# Patient Record
Sex: Male | Born: 1955
Health system: Southern US, Community
[De-identification: ages and names within clinical notes are randomized; demographics above are authoritative.]

## PROBLEM LIST (undated history)

## (undated) DIAGNOSIS — J302 Other seasonal allergic rhinitis: Secondary | ICD-10-CM

## (undated) DIAGNOSIS — C099 Malignant neoplasm of tonsil, unspecified: Principal | ICD-10-CM

## (undated) DIAGNOSIS — R011 Cardiac murmur, unspecified: Secondary | ICD-10-CM

## (undated) DIAGNOSIS — E785 Hyperlipidemia, unspecified: Secondary | ICD-10-CM

## (undated) DIAGNOSIS — Z923 Personal history of irradiation: Secondary | ICD-10-CM

## (undated) HISTORY — DX: Hyperlipidemia, unspecified: E78.5

## (undated) HISTORY — DX: Cardiac murmur, unspecified: R01.1

## (undated) HISTORY — DX: Other seasonal allergic rhinitis: J30.2

## (undated) HISTORY — DX: Malignant neoplasm of tonsil, unspecified: C09.9

## (undated) HISTORY — PX: WISDOM TOOTH EXTRACTION: SHX21

---

## 2012-03-02 ENCOUNTER — Encounter (HOSPITAL_COMMUNITY): Admission: EM | Disposition: A | Discharge status: Home / Self Care | Payer: Self-pay | Source: Home / Self Care

## 2012-03-02 ENCOUNTER — Encounter (HOSPITAL_COMMUNITY): Payer: Self-pay | Admitting: Anesthesiology

## 2012-03-02 ENCOUNTER — Inpatient Hospital Stay (HOSPITAL_COMMUNITY)
Admission: EM | Admit: 2012-03-02 | Discharge: 2012-03-06 | DRG: 340 | Disposition: A | Discharge status: Home / Self Care | Payer: MEDICAID | Attending: General Surgery | Admitting: General Surgery

## 2012-03-02 ENCOUNTER — Inpatient Hospital Stay (HOSPITAL_COMMUNITY)
Admission: RE | Admit: 2012-03-02 | Discharge: 2012-03-02 | Payer: Self-pay | Source: Ambulatory Visit | Attending: Emergency Medicine | Admitting: Emergency Medicine

## 2012-03-02 ENCOUNTER — Encounter (HOSPITAL_COMMUNITY): Payer: Self-pay

## 2012-03-02 ENCOUNTER — Ambulatory Visit (HOSPITAL_COMMUNITY)
Admission: RE | Admit: 2012-03-02 | Discharge: 2012-03-02 | Disposition: A | Discharge status: Home / Self Care | Payer: Self-pay | Source: Ambulatory Visit | Attending: Emergency Medicine | Admitting: Emergency Medicine

## 2012-03-02 ENCOUNTER — Emergency Department (HOSPITAL_COMMUNITY): Payer: Self-pay | Admitting: Anesthesiology

## 2012-03-02 ENCOUNTER — Ambulatory Visit: Payer: Self-pay | Admitting: Emergency Medicine

## 2012-03-02 ENCOUNTER — Other Ambulatory Visit (HOSPITAL_COMMUNITY): Payer: Self-pay | Admitting: Radiology

## 2012-03-02 ENCOUNTER — Encounter (HOSPITAL_COMMUNITY): Payer: Self-pay | Admitting: *Deleted

## 2012-03-02 VITALS — BP 141/74 | HR 94 | Temp 98.2°F | Resp 16 | Ht 62.5 in | Wt 190.0 lb

## 2012-03-02 DIAGNOSIS — K3533 Acute appendicitis with perforation and localized peritonitis, with abscess: Principal | ICD-10-CM | POA: Diagnosis present

## 2012-03-02 DIAGNOSIS — R109 Unspecified abdominal pain: Secondary | ICD-10-CM

## 2012-03-02 DIAGNOSIS — M51379 Other intervertebral disc degeneration, lumbosacral region without mention of lumbar back pain or lower extremity pain: Secondary | ICD-10-CM | POA: Insufficient documentation

## 2012-03-02 DIAGNOSIS — R1031 Right lower quadrant pain: Secondary | ICD-10-CM | POA: Insufficient documentation

## 2012-03-02 DIAGNOSIS — M48061 Spinal stenosis, lumbar region without neurogenic claudication: Secondary | ICD-10-CM | POA: Insufficient documentation

## 2012-03-02 DIAGNOSIS — M5137 Other intervertebral disc degeneration, lumbosacral region: Secondary | ICD-10-CM | POA: Insufficient documentation

## 2012-03-02 DIAGNOSIS — K5730 Diverticulosis of large intestine without perforation or abscess without bleeding: Secondary | ICD-10-CM | POA: Insufficient documentation

## 2012-03-02 DIAGNOSIS — K8020 Calculus of gallbladder without cholecystitis without obstruction: Secondary | ICD-10-CM | POA: Insufficient documentation

## 2012-03-02 DIAGNOSIS — E876 Hypokalemia: Secondary | ICD-10-CM | POA: Diagnosis not present

## 2012-03-02 DIAGNOSIS — K37 Unspecified appendicitis: Secondary | ICD-10-CM

## 2012-03-02 DIAGNOSIS — Z7982 Long term (current) use of aspirin: Secondary | ICD-10-CM

## 2012-03-02 DIAGNOSIS — K3580 Unspecified acute appendicitis: Secondary | ICD-10-CM | POA: Insufficient documentation

## 2012-03-02 DIAGNOSIS — Z87891 Personal history of nicotine dependence: Secondary | ICD-10-CM

## 2012-03-02 DIAGNOSIS — K352 Acute appendicitis with generalized peritonitis, without abscess: Secondary | ICD-10-CM

## 2012-03-02 DIAGNOSIS — N200 Calculus of kidney: Secondary | ICD-10-CM | POA: Insufficient documentation

## 2012-03-02 HISTORY — PX: LAPAROSCOPIC APPENDECTOMY: SHX408

## 2012-03-02 HISTORY — PX: APPENDECTOMY: SHX54

## 2012-03-02 LAB — POCT CBC
Granulocyte percent: 85.8 %G — AB (ref 37–80)
HCT, POC: 46.6 % (ref 43.5–53.7)
MCHC: 30.9 g/dL — AB (ref 31.8–35.4)
MID (cbc): 0.8 (ref 0–0.9)
MPV: 10.3 fL (ref 0–99.8)
POC Granulocyte: 14.1 — AB (ref 2–6.9)
POC LYMPH PERCENT: 9.1 %L — AB (ref 10–50)
POC MID %: 5.1 %M (ref 0–12)
Platelet Count, POC: 235 10*3/uL (ref 142–424)
RDW, POC: 14.2 %

## 2012-03-02 LAB — BUN: BUN: 15 mg/dL (ref 6–23)

## 2012-03-02 LAB — CREATININE, SERUM: Creatinine, Ser: 0.87 mg/dL (ref 0.50–1.35)

## 2012-03-02 SURGERY — APPENDECTOMY, LAPAROSCOPIC
Anesthesia: General | Site: Abdomen | Wound class: Dirty or Infected

## 2012-03-02 MED ORDER — PROPOFOL 10 MG/ML IV BOLUS
INTRAVENOUS | Status: DC | PRN
  Administered 2012-03-02: 30 mg via INTRAVENOUS
  Administered 2012-03-02: 140 mg via INTRAVENOUS

## 2012-03-02 MED ORDER — ONDANSETRON HCL 4 MG/2ML IJ SOLN
4.0000 mg | Freq: Once | INTRAMUSCULAR | Status: AC
  Administered 2012-03-02: 4 mg via INTRAVENOUS
  Filled 2012-03-02: qty 2

## 2012-03-02 MED ORDER — OXYCODONE HCL 5 MG PO TABS: 5.0000 mg | ORAL_TABLET | Freq: Once | ORAL | Status: AC | PRN

## 2012-03-02 MED ORDER — DEXTROSE 5 % IV SOLN
2.0000 g | INTRAVENOUS | Status: DC | PRN
  Administered 2012-03-02: 2 g via INTRAVENOUS

## 2012-03-02 MED ORDER — FENTANYL CITRATE 0.05 MG/ML IJ SOLN
INTRAMUSCULAR | Status: DC | PRN
  Administered 2012-03-02 (×2): 50 ug via INTRAVENOUS
  Administered 2012-03-02: 150 ug via INTRAVENOUS

## 2012-03-02 MED ORDER — MORPHINE SULFATE 4 MG/ML IJ SOLN
4.0000 mg | Freq: Once | INTRAMUSCULAR | Status: AC
  Administered 2012-03-02: 4 mg via INTRAVENOUS
  Filled 2012-03-02: qty 1

## 2012-03-02 MED ORDER — OXYCODONE HCL 5 MG/5ML PO SOLN: 5.0000 mg | Freq: Once | ORAL | Status: AC | PRN

## 2012-03-02 MED ORDER — ROCURONIUM BROMIDE 100 MG/10ML IV SOLN
INTRAVENOUS | Status: DC | PRN
  Administered 2012-03-02: 10 mg via INTRAVENOUS
  Administered 2012-03-02: 20 mg via INTRAVENOUS

## 2012-03-02 MED ORDER — SUCCINYLCHOLINE CHLORIDE 20 MG/ML IJ SOLN
INTRAMUSCULAR | Status: DC | PRN
  Administered 2012-03-02: 100 mg via INTRAVENOUS

## 2012-03-02 MED ORDER — HYDROMORPHONE HCL PF 1 MG/ML IJ SOLN
INTRAMUSCULAR | Status: AC
  Administered 2012-03-02: 0.5 mg via INTRAVENOUS
  Filled 2012-03-02: qty 1

## 2012-03-02 MED ORDER — LACTATED RINGERS IV SOLN
INTRAVENOUS | Status: DC | PRN
  Administered 2012-03-02 (×2): via INTRAVENOUS

## 2012-03-02 MED ORDER — NEOSTIGMINE METHYLSULFATE 1 MG/ML IJ SOLN
INTRAMUSCULAR | Status: DC | PRN
  Administered 2012-03-02: 4 mg via INTRAVENOUS

## 2012-03-02 MED ORDER — MIDAZOLAM HCL 5 MG/5ML IJ SOLN
INTRAMUSCULAR | Status: DC | PRN
  Administered 2012-03-02: 2 mg via INTRAVENOUS

## 2012-03-02 MED ORDER — SODIUM CHLORIDE 0.9 % IR SOLN
Status: DC | PRN
  Administered 2012-03-02: 1000 mL

## 2012-03-02 MED ORDER — GLYCOPYRROLATE 0.2 MG/ML IJ SOLN
INTRAMUSCULAR | Status: DC | PRN
  Administered 2012-03-02: 0.6 mg via INTRAVENOUS

## 2012-03-02 MED ORDER — BUPIVACAINE-EPINEPHRINE 0.5% -1:200000 IJ SOLN
INTRAMUSCULAR | Status: DC | PRN
  Administered 2012-03-02: 8 mL

## 2012-03-02 MED ORDER — IOHEXOL 300 MG/ML  SOLN
100.0000 mL | Freq: Once | INTRAMUSCULAR | Status: AC | PRN
  Administered 2012-03-02: 100 mL via INTRAVENOUS

## 2012-03-02 MED ORDER — HYDROMORPHONE HCL PF 1 MG/ML IJ SOLN
INTRAMUSCULAR | Status: AC
  Filled 2012-03-02: qty 1

## 2012-03-02 MED ORDER — LIDOCAINE HCL (CARDIAC) 20 MG/ML IV SOLN
INTRAVENOUS | Status: DC | PRN
  Administered 2012-03-02: 50 mg via INTRAVENOUS

## 2012-03-02 MED ORDER — HYDROMORPHONE HCL PF 1 MG/ML IJ SOLN
0.2500 mg | INTRAMUSCULAR | Status: DC | PRN
  Administered 2012-03-02 (×3): 0.5 mg via INTRAVENOUS

## 2012-03-02 MED ORDER — KETOROLAC TROMETHAMINE 30 MG/ML IJ SOLN: 30.0000 mg | Freq: Once | INTRAMUSCULAR | Status: DC

## 2012-03-02 MED ORDER — ONDANSETRON HCL 4 MG/2ML IJ SOLN
INTRAMUSCULAR | Status: DC | PRN
  Administered 2012-03-02: 4 mg via INTRAVENOUS

## 2012-03-02 MED ORDER — DEXTROSE 5 % IV SOLN
2.0000 g | INTRAVENOUS | Status: DC
  Filled 2012-03-02 (×2): qty 2

## 2012-03-02 MED ORDER — DEXAMETHASONE SODIUM PHOSPHATE 10 MG/ML IJ SOLN: 10.0000 mg | Freq: Once | INTRAMUSCULAR | Status: DC

## 2012-03-02 SURGICAL SUPPLY — 42 items
APPLIER CLIP ROT 10 11.4 M/L (STAPLE)
BLADE SURG ROTATE 9660 (MISCELLANEOUS) ×2 IMPLANT
CANISTER SUCTION 2500CC (MISCELLANEOUS) ×2 IMPLANT
CHLORAPREP W/TINT 26ML (MISCELLANEOUS) ×2 IMPLANT
CLIP APPLIE ROT 10 11.4 M/L (STAPLE) IMPLANT
CLOTH BEACON ORANGE TIMEOUT ST (SAFETY) ×2 IMPLANT
COVER SURGICAL LIGHT HANDLE (MISCELLANEOUS) ×2 IMPLANT
CUTTER FLEX LINEAR 45M (STAPLE) ×2 IMPLANT
DERMABOND ADVANCED (GAUZE/BANDAGES/DRESSINGS) ×1
DERMABOND ADVANCED .7 DNX12 (GAUZE/BANDAGES/DRESSINGS) ×1 IMPLANT
DRAIN CHANNEL 19F RND (DRAIN) ×2 IMPLANT
DRAPE UTILITY 15X26 W/TAPE STR (DRAPE) ×4 IMPLANT
DRAPE WARM FLUID 44X44 (DRAPE) IMPLANT
ELECT REM PT RETURN 9FT ADLT (ELECTROSURGICAL) ×2
ELECTRODE REM PT RTRN 9FT ADLT (ELECTROSURGICAL) ×1 IMPLANT
ENDOLOOP SUT PDS II  0 18 (SUTURE)
ENDOLOOP SUT PDS II 0 18 (SUTURE) IMPLANT
EVACUATOR SILICONE 100CC (DRAIN) ×2 IMPLANT
GLOVE BIO SURGEON STRL SZ8 (GLOVE) ×2 IMPLANT
GLOVE BIOGEL PI IND STRL 8 (GLOVE) IMPLANT
GLOVE BIOGEL PI INDICATOR 8 (GLOVE)
GOWN STRL NON-REIN LRG LVL3 (GOWN DISPOSABLE) ×4 IMPLANT
KIT BASIN OR (CUSTOM PROCEDURE TRAY) ×2 IMPLANT
KIT ROOM TURNOVER OR (KITS) ×2 IMPLANT
NS IRRIG 1000ML POUR BTL (IV SOLUTION) ×2 IMPLANT
PAD ARMBOARD 7.5X6 YLW CONV (MISCELLANEOUS) ×2 IMPLANT
POUCH SPECIMEN RETRIEVAL 10MM (ENDOMECHANICALS) ×2 IMPLANT
RELOAD STAPLE TA45 3.5 REG BLU (ENDOMECHANICALS) ×4 IMPLANT
SCALPEL HARMONIC ACE (MISCELLANEOUS) ×2 IMPLANT
SET IRRIG TUBING LAPAROSCOPIC (IRRIGATION / IRRIGATOR) ×2 IMPLANT
SPECIMEN JAR SMALL (MISCELLANEOUS) ×2 IMPLANT
SPONGE GAUZE 4X4 12PLY (GAUZE/BANDAGES/DRESSINGS) ×2 IMPLANT
SUT MON AB 4-0 PC3 18 (SUTURE) ×2 IMPLANT
SUT SILK 3 0 FS 1X18 (SUTURE) ×2 IMPLANT
TAPE CLOTH SURG 4X10 WHT LF (GAUZE/BANDAGES/DRESSINGS) ×2 IMPLANT
TOWEL OR 17X24 6PK STRL BLUE (TOWEL DISPOSABLE) ×2 IMPLANT
TOWEL OR 17X26 10 PK STRL BLUE (TOWEL DISPOSABLE) ×2 IMPLANT
TRAY FOLEY CATH 14FR (SET/KITS/TRAYS/PACK) ×2 IMPLANT
TRAY LAPAROSCOPIC (CUSTOM PROCEDURE TRAY) ×2 IMPLANT
TROCAR XCEL BLADELESS 5X75MML (TROCAR) ×4 IMPLANT
TROCAR XCEL BLUNT TIP 100MML (ENDOMECHANICALS) ×2 IMPLANT
WATER STERILE IRR 1000ML POUR (IV SOLUTION) IMPLANT

## 2012-03-02 NOTE — ED Notes (Signed)
Blank consent sent with pt to OR where the surgeon will explain risks & benefits of procedure, OR informed of need for completed consent

## 2012-03-02 NOTE — Anesthesia Procedure Notes (Signed)
Procedure Name: Intubation Date/Time: 03/02/2012 9:27 PM Performed by: Garen Lah Pre-anesthesia Checklist: Patient identified, Timeout performed, Emergency Drugs available, Suction available and Patient being monitored Patient Re-evaluated:Patient Re-evaluated prior to inductionOxygen Delivery Method: Circle system utilized Preoxygenation: Pre-oxygenation with 100% oxygen Intubation Type: IV induction, Rapid sequence and Cricoid Pressure applied Laryngoscope Size: Mac and 3 Grade View: Grade I Tube type: Oral Tube size: 7.5 mm Number of attempts: 1 Airway Equipment and Method: Stylet Placement Confirmation: ETT inserted through vocal cords under direct vision,  positive ETCO2 and breath sounds checked- equal and bilateral Secured at: 23 cm Tube secured with: Tape Dental Injury: Teeth and Oropharynx as per pre-operative assessment

## 2012-03-02 NOTE — Transfer of Care (Signed)
Immediate Anesthesia Transfer of Care Note  Patient: Douglas Norris  Procedure(s) Performed: Procedure(s) (LRB) with comments: APPENDECTOMY LAPAROSCOPIC (N/A)  Patient Location: PACU  Anesthesia Type:General  Level of Consciousness: awake, alert  and oriented  Airway & Oxygen Therapy: Patient Spontanous Breathing and Patient connected to nasal cannula oxygen  Post-op Assessment: Report given to PACU RN and Post -op Vital signs reviewed and stable  Post vital signs: Reviewed and stable  Complications: No apparent anesthesia complications

## 2012-03-02 NOTE — ED Provider Notes (Signed)
History     CSN: [id number]  Arrival date & time 03/02/12  1659   First MD Initiated Contact with Patient 03/02/12 1724      Chief Complaint  Patient presents with  . Abdominal Pain    (Consider location/radiation/quality/duration/timing/severity/associated sxs/prior treatment) HPI Comments: 56 year old male with no significant past medical history presents emergency department with chief complaint of right lower quadrant abdominal pain.  Onset of symptoms began yesterday at 10 a.m. and are described as a aching throbbing sensation that has been gradually worsening.  Pain does not radiate.  Severity is 5/10 at rest with spikes of 10/10 with movement and palpation.  No known alleviating factors.  The Associated symptoms include nausea, but patient denies vomiting, diarrhea, fever, night sweats, chills, chest pain, urinary symptoms or back pain.  The history is provided by the patient.    No past medical history on file.  No past surgical history on file.  Family History  Problem Relation Age of Onset  . Cancer Mother   . Cancer Father   . Cancer Sister   . Cancer Sister     History  Substance Use Topics  . Smoking status: Former Games developer  . Smokeless tobacco: Not on file  . Alcohol Use: Yes      Review of Systems  Constitutional: Negative for fever, chills and appetite change.  HENT: Negative for congestion, neck stiffness and ear discharge.   Eyes: Negative for visual disturbance.  Respiratory: Negative for shortness of breath.   Cardiovascular: Negative for chest pain and leg swelling.  Gastrointestinal: Positive for nausea and abdominal pain. Negative for vomiting, diarrhea, constipation and blood in stool.  Genitourinary: Negative for dysuria, urgency and frequency.  Skin: Negative for rash.  Neurological: Negative for dizziness, syncope, weakness, light-headedness, numbness and headaches.  Psychiatric/Behavioral: Negative for confusion.  All other systems  reviewed and are negative.    Allergies  Review of patient's allergies indicates no known allergies.  Home Medications   Current Outpatient Rx  Name  Route  Sig  Dispense  Refill  . ACETAMINOPHEN 500 MG PO TABS   Oral   Take 1,000 mg by mouth every 4 (four) hours as needed. For pain         . ASPIRIN 325 MG PO TABS   Oral   Take 325 mg by mouth daily.           BP 144/97  Pulse 94  Temp 98.1 F (36.7 C) (Oral)  Resp 18  SpO2 96%  Physical Exam  Nursing note and vitals reviewed. Constitutional: Vital signs are normal. He appears well-developed and well-nourished. No distress.  HENT:  Head: Normocephalic and atraumatic.  Mouth/Throat: Uvula is midline, oropharynx is clear and moist and mucous membranes are normal.  Eyes: Conjunctivae normal and EOM are normal. Pupils are equal, round, and reactive to light.  Neck: Normal range of motion and full passive range of motion without pain. Neck supple. No spinous process tenderness and no muscular tenderness present. No rigidity. No Brudzinski's sign noted.  Cardiovascular: Normal rate and regular rhythm.   Pulmonary/Chest: Effort normal and breath sounds normal. No accessory muscle usage. Not tachypneic. No respiratory distress.  Abdominal: Soft. Normal appearance. He exhibits no distension, no ascites, no pulsatile midline mass and no mass. There is tenderness (RLQ). There is no CVA tenderness. No hernia.       Normal bowel sounds, abd soft, non rigid, negative Rovsing, + rebound & gaurding  Lymphadenopathy:    He  has no cervical adenopathy.  Neurological: He is alert.  Skin: Skin is warm and dry. No rash noted. He is not diaphoretic.  Psychiatric: He has a normal mood and affect. His speech is normal and behavior is normal.    ED Course  Procedures (including critical care time)  Labs Reviewed - No data to display  CBC    Component Value Date/Time   WBC 16.4* 03/02/2012 1253   RBC 4.94 03/02/2012 1253   HGB 14.4  03/02/2012 1253   HCT 46.6 03/02/2012 1253   MCV 94.4 03/02/2012 1253   MCH 29.1 03/02/2012 1253   MCHC 30.9* 03/02/2012 1253     BMET    Component Value Date/Time   BUN 15 03/02/2012 1417   CREATININE 0.87 03/02/2012 1417   GFRNONAA >90 03/02/2012 1417   GFRAA >90 03/02/2012 1417    Ct Abdomen Pelvis W Contrast  03/02/2012  *RADIOLOGY REPORT*  Clinical Data: Right lower quadrant abdominal pain.  CT ABDOMEN AND PELVIS WITH CONTRAST  Technique:  Multidetector CT imaging of the abdomen and pelvis was performed following the standard protocol during bolus administration of intravenous contrast.  Contrast: OMNIPAQUE IOHEXOL 300 MG/ML  SOLN  Comparison: None.  Findings: The liver, spleen, pancreas, and adrenal glands appear unremarkable.  Several small gallstones are present, each measuring about 4 mm in diameter.  No gallbladder wall thickening or pericholecystic fluid observed.  No extrahepatic biliary dilatation.  No pathologic retroperitoneal or porta hepatis adenopathy is identified.  There is a 2 mm right kidney lower pole nonobstructive calculus and a 3 mm left mid kidney nonobstructive calculus.  There is a 2 mm left kidney lower pole nonobstructive calculus.  No hydronephrosis or hydroureter.  Acute appendicitis is present, with the appendix measuring 16 mm in diameter and with extensive peri-appendiceal stranding.  Adjacent wall thickening of the distal ileum is likely reactive.  The terminal ileum does not appear inflamed.  A small amount of fluid tracks along the right pericolic gutter. No extraluminal gas observed.  Descending and sigmoid diverticulosis noted without active diverticulitis.  Urinary bladder unremarkable. 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  IMPRESSION:  1.  Acute appendicitis with adjacent periappendiceal fluid but without discrete abscess observed. 2.  Cholelithiasis. 3.  Bilateral nonobstructive nephrolithiasis. 4.  Descending  and sigmoid colon diverticulosis.   Original Report Authenticated By: Gaylyn Rong, M.D.      No diagnosis found.  Consult, Dr. Luisa Hart has seen pt in the ER and will take to OR  MDM  Acute appendicitis  56 yo male w no sig PMH presents w RLQ pain onset yesterday. Vitals normal. RLQ abd pain w peritoneal signs on ecam. Labs and imaging reviewed. Leukocytosis. Pt to be admitted to surgery. NPO since in ER.  The patient appears reasonably stabilized for admission considering the current resources, flow, and capabilities available in the ED at this time, and I doubt any other [facility] requiring further screening and/or treatment in the ED prior to admission.        Jaci Carrel, [place] 03/02/12 1922

## 2012-03-02 NOTE — Preoperative (Signed)
Beta Blockers   Reason not to administer Beta Blockers:Not Applicable. No home beta blockers 

## 2012-03-02 NOTE — ED Notes (Signed)
Pt c/O R lower abd pain onset x1 day, pt denies V/D, pt c/o nausea with standing, pt A&O x4, follows commands, speaks in complete sentences

## 2012-03-02 NOTE — Progress Notes (Signed)
[facility] [address] [place] [address] [phone]  Date:  03/02/2012   Name:  Douglas Norris   DOB:  20-Jun-1955   MRN:  [id number]  PCP:  No primary provider on file.    Chief Complaint: Abdominal Pain   History of Present Illness:  Douglas Norris is a 56 y.o. very pleasant male patient who presents with the following:  Onset of RLQ pain that started yesterday.  Did not interfere with appetite.  No nausea or vomiting. No stool change.  Pain constant in RLQ with no migration.  Pain increases with railroad tracks and standing up and walking around.  No fever or chills.  No cough or coryza.  Hungry.  There is no problem list on file for this patient.   History reviewed. No pertinent past medical history.  History reviewed. No pertinent past surgical history.  History  Substance Use Topics  . Smoking status: Former Games developer  . Smokeless tobacco: Not on file  . Alcohol Use: Yes    Family History  Problem Relation Age of Onset  . Cancer Mother   . Cancer Father   . Cancer Sister   . Cancer Sister     No Known Allergies  Medication list has been reviewed and updated.  No current outpatient prescriptions on file prior to visit.    Review of Systems:  As per HPI, otherwise negative.    Physical Examination: Filed Vitals:   03/02/12 1217  BP: 141/74  Pulse: 94  Temp: 98.2 F (36.8 C)  Resp: 16   Filed Vitals:   03/02/12 1217  Height: 5' 2.5" (1.588 m)  Weight: 190 lb (86.183 kg)   Body mass index is 34.20 kg/(m^2). Ideal Body Weight: Weight in (lb) to have BMI = 25: 138.6   GEN: WDWN, NAD, Non-toxic, A & O x 3.  No rash or sepsis or icteris HEENT: Atraumatic, Normocephalic. Neck supple. No masses, No LAD. Ears and Nose: No external deformity. CV: RRR, No M/G/R. No JVD. No thrill. No extra heart sounds. PULM: CTA B, no wheezes, crackles, rhonchi. No retractions. No resp. distress. No accessory muscle use. ABD: S, tender  RLQ, ND, +BS. Direct rebound RLQ. No HSM. EXTR: No c/c/e NEURO Normal gait.  PSYCH: Normally interactive. Conversant. Not depressed or anxious appearing.  Calm demeanor.    Assessment and Plan: RLQ abdominal pain CBC  Carmelina Dane, MD  Results for orders placed in visit on 03/02/12  POCT CBC      Component Value Range   WBC 16.4 (*) 4.6 - 10.2 K/uL   Lymph, poc 1.5  0.6 - 3.4   POC LYMPH PERCENT 9.1 (*) 10 - 50 %L   MID (cbc) 0.8  0 - 0.9   POC MID % 5.1  0 - 12 %M   POC Granulocyte 14.1 (*) 2 - 6.9   Granulocyte percent 85.8 (*) 37 - 80 %G   RBC 4.94  4.69 - 6.13 M/uL   Hemoglobin 14.4  14.1 - 18.1 g/dL   HCT, POC 29.5  62.1 - 53.7 %   MCV 94.4  80 - 97 fL   MCH, POC 29.1  27 - 31.2 pg   MCHC 30.9 (*) 31.8 - 35.4 g/dL   RDW, POC 30.8     Platelet Count, POC 235  142 - 424 K/uL   MPV 10.3  0 - 99.8 fL   TO ER for evaluation and CT scan.  Likely appendicitis

## 2012-03-02 NOTE — Op Note (Signed)
Appendectomy, Lap, Procedure Note  Indications: The patient presented with a history of right-sided abdominal pain. A CT revealed findings consistent with acute appendicitis.The procedure has been discussed with the patient.  Alternative therapies have been discussed with the patient.  Operative risks include bleeding,  Infection,  Organ injury,  Nerve injury,  Blood vessel injury,  DVT,  Pulmonary embolism,  Death,  And possible reoperation.  Medical management risks include worsening of present situation.  The success of the procedure is 50 -90 % at treating patients symptoms.  The patient understands and agrees to proceed.  Pre-operative Diagnosis: Acute appendicitis with peritoneal abscess  Post-operative Diagnosis: Same  Surgeon: Pride Gonzales A.   Assistants: OR staff  Anesthesia: General endotracheal - Double lumen tube and Local anesthesia 0.25.% bupivacaine, with epinephrine  ASA Class: 2  Procedure Details  The patient was seen again in the Holding Room. The risks, benefits, complications, treatment options, and expected outcomes were discussed with the patient and/or family. The possibilities of reaction to medication, pulmonary aspiration, perforation of viscus, bleeding, recurrent infection, finding a normal appendix, the need for additional procedures, failure to diagnose a condition, and creating a complication requiring transfusion or operation were discussed. There was concurrence with the proposed plan and informed consent was obtained. The site of surgery was properly noted/marked. The patient was taken to Operating Room, identified as Douglas Norris and the procedure verified as Appendectomy. A Time Out was held and the above information confirmed.  The patient was placed in the supine position and general anesthesia was induced, along with placement of orogastric tube, Venodyne boots, and a Foley catheter. The abdomen was prepped and draped in a sterile fashion. A one  centimeter infraumbilical incision was made and the peritoneal cavity was accessed using the OPEN  technique. The pneumoperitoneum was then established to steady pressure of 12 mmHg. A 12 mm port was placed through the umbilical incision. Additional 5 mm cannulas then placed in the left lower quadrant of the abdomen and half way between the umbilicus and pubic symphysis under direct vision. A careful evaluation of the entire abdomen was carried out. The patient was placed in Trendelenburg and left lateral decubitus position. The small intestines were retracted in the cephalad and left lateral direction away from the pelvis and right lower quadrant. The patient was found to have an enlarged and inflamed appendix that was extending into the pelvis. There was no evidence of perforation.  The appendix was carefully dissected. A window was made in the mesoappendix at the base of the appendix. A harmonic scalpel was used across the mesoappendix. The appendix was divided at its base using an endo-GIA stapler.  A second load was used .  The stump was inflamed but closed andMinimal appendiceal stump was left in place. There was no evidence of bleeding, leakage, or complication after division of the appendix. Irrigation was also performed and irrigate suctioned from the abdomen as well. Drain placed in the RLQ abscess cavity and brought out through the lower port site ans secured with 2 O nylon.  The umbilical port site was closed using 0 vicryl pursestring sutures fashion at the level of the fascia. The trocar site skin wounds were closed using skin staples.  Instrument, sponge, and needle counts were correct at the conclusion of the case.   Findings: The appendix was found to be inflamed. There were signs of necrosis.  There was perforation. There was abscess formation.  Estimated Blood Loss:  less than 50 mL  Drains: 66 F round         Total IV Fluids: 1500 mL         Specimens: appendix            Complications:  None; patient tolerated the procedure well.         Disposition: PACU - hemodynamically stable.         Condition: stable

## 2012-03-02 NOTE — H&P (Signed)
Douglas Norris is an 56 y.o. male.   Chief Complaint: abdominal pain HPI: 1 day history of RLQ abdominal pain.  Sharp in nature.  Worse with movement.  CT showed acute appendicitis  History reviewed. No pertinent past medical history.  History reviewed. No pertinent past surgical history.  Family History  Problem Relation Age of Onset  . Cancer Mother   . Cancer Father   . Cancer Sister   . Cancer Sister    Social History:  reports that he quit smoking about 8 years ago. He does not have any smokeless tobacco history on file. He reports that he does not drink alcohol or use illicit drugs.  Allergies: No Known Allergies   (Not in a hospital admission)  Results for orders placed during the hospital encounter of 03/02/12 (from the past 48 hour(s))  BUN     Status: Normal   Collection Time   03/02/12  2:17 PM      Component Value Range Comment   BUN 15  6 - 23 mg/dL   CREATININE, SERUM     Status: Normal   Collection Time   03/02/12  2:17 PM      Component Value Range Comment   Creatinine, Ser 0.87  0.50 - 1.35 mg/dL    GFR calc non Af Amer >90  >90 mL/min    GFR calc Af Amer >90  >90 mL/min    Ct Abdomen Pelvis W Contrast  03/02/2012  *RADIOLOGY REPORT*  Clinical Data: Right lower quadrant abdominal pain.  CT ABDOMEN AND PELVIS WITH CONTRAST  Technique:  Multidetector CT imaging of the abdomen and pelvis was performed following the standard protocol during bolus administration of intravenous contrast.  Contrast: OMNIPAQUE IOHEXOL 300 MG/ML  SOLN  Comparison: None.  Findings: The liver, spleen, pancreas, and adrenal glands appear unremarkable.  Several small gallstones are present, each measuring about 4 mm in diameter.  No gallbladder wall thickening or pericholecystic fluid observed.  No extrahepatic biliary dilatation.  No pathologic retroperitoneal or porta hepatis adenopathy is identified.  There is a 2 mm right kidney lower pole nonobstructive calculus and a 3 mm left  mid kidney nonobstructive calculus.  There is a 2 mm left kidney lower pole nonobstructive calculus.  No hydronephrosis or hydroureter.  Acute appendicitis is present, with the appendix measuring 16 mm in diameter and with extensive peri-appendiceal stranding.  Adjacent wall thickening of the distal ileum is likely reactive.  The terminal ileum does not appear inflamed.  A small amount of fluid tracks along the right pericolic gutter. No extraluminal gas observed.  Descending and sigmoid diverticulosis noted without active diverticulitis.  Urinary bladder unremarkable. 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  IMPRESSION:  1.  Acute appendicitis with adjacent periappendiceal fluid but without discrete abscess observed. 2.  Cholelithiasis. 3.  Bilateral nonobstructive nephrolithiasis. 4.  Descending and sigmoid colon diverticulosis.   Original Report Authenticated By: Gaylyn Rong, M.D.     Review of Systems  Constitutional: Negative for fever and chills.  HENT: Negative.   Eyes: Negative.   Respiratory: Negative.   Cardiovascular: Negative.   Gastrointestinal: Positive for abdominal pain.  Genitourinary: Negative.   Musculoskeletal: Negative.   Skin: Negative.   Neurological: Negative.   Endo/Heme/Allergies: Negative.   Psychiatric/Behavioral: Negative.     Blood pressure 129/82, pulse 96, temperature 98.1 F (36.7 C), temperature source Oral, resp. rate 19, SpO2 97.00%. Physical Exam  Constitutional: He is oriented to person, place, and  time. He appears well-developed and well-nourished.  HENT:  Head: Normocephalic and atraumatic.  Eyes: EOM are normal. Pupils are equal, round, and reactive to light.  Neck: Normal range of motion. Neck supple.  Cardiovascular: Normal rate and regular rhythm.   Respiratory: Effort normal and breath sounds normal.  GI: There is tenderness in the right lower quadrant. There is rebound and tenderness at McBurney's  point.  Musculoskeletal: Normal range of motion.  Neurological: He is alert and oriented to person, place, and time.  Skin: Skin is warm and dry.  Psychiatric: He has a normal mood and affect. His behavior is normal. Judgment and thought content normal.     Assessment/Plan Acute appendicitis Recommend laparoscopic appendectomy.  Risks of bleeding infection,  Open procedure abscess,  Organ injury.  Alternatives discussed as well.  Agrees to proceed with laparoscopic appendectomy.  Areona Homer A. 03/02/2012, 8:47 PM

## 2012-03-02 NOTE — Anesthesia Postprocedure Evaluation (Signed)
  Anesthesia Post-op Note  Patient: Douglas Norris  Procedure(s) Performed: Procedure(s) (LRB) with comments: APPENDECTOMY LAPAROSCOPIC (N/A)  Patient Location: PACU  Anesthesia Type:General  Level of Consciousness: awake and alert   Airway and Oxygen Therapy: Patient Spontanous Breathing and Patient connected to nasal cannula oxygen  Post-op Pain: mild  Post-op Assessment: Post-op Vital signs reviewed, Patient's Cardiovascular Status Stable, Respiratory Function Stable, Patent Airway and No signs of Nausea or vomiting  Post-op Vital Signs: Reviewed and stable  Complications: No apparent anesthesia complications

## 2012-03-02 NOTE — Progress Notes (Signed)
56 yo man with abdominal pain, elevated WBC, and CT scan that showed acute appendicitis, seen by Dr. Luisa Hart of General Surgery.

## 2012-03-02 NOTE — Anesthesia Preprocedure Evaluation (Addendum)
Anesthesia Evaluation  Patient identified by MRN, date of birth, ID band Patient awake    Reviewed: Allergy & Precautions, H&P , NPO status , Patient's Chart, lab work & pertinent test results, reviewed documented beta blocker date and time   Airway Mallampati: II TM Distance: >3 FB Neck ROM: Full    Dental No notable dental hx. (+) Teeth Intact and Dental Advisory Given   Pulmonary neg pulmonary ROS,  breath sounds clear to auscultation  Pulmonary exam normal       Cardiovascular negative cardio ROS  Rhythm:Regular Rate:Normal     Neuro/Psych negative neurological ROS  negative psych ROS   GI/Hepatic negative GI ROS, Neg liver ROS,   Endo/Other  negative endocrine ROS  Renal/GU negative Renal ROS  negative genitourinary   Musculoskeletal   Abdominal   Peds  Hematology negative hematology ROS (+)   Anesthesia Other Findings   Reproductive/Obstetrics negative OB ROS                          Anesthesia Physical Anesthesia Plan  ASA: II and emergent  Anesthesia Plan: General   Post-op Pain Management:    Induction: Intravenous, Rapid sequence and Cricoid pressure planned  Airway Management Planned: Oral ETT  Additional Equipment:   Intra-op Plan:   Post-operative Plan: Extubation in OR  Informed Consent: I have reviewed the patients History and Physical, chart, labs and discussed the procedure including the risks, benefits and alternatives for the proposed anesthesia with the patient or authorized representative who has indicated his/her understanding and acceptance.   Dental advisory given  Plan Discussed with: CRNA  Anesthesia Plan Comments:        Anesthesia Quick Evaluation

## 2012-03-03 LAB — CBC
HCT: 41.2 % (ref 39.0–52.0)
Hemoglobin: 13.9 g/dL (ref 13.0–17.0)
MCV: 90.5 fL (ref 78.0–100.0)
WBC: 16 10*3/uL — ABNORMAL HIGH (ref 4.0–10.5)

## 2012-03-03 LAB — COMPREHENSIVE METABOLIC PANEL
Albumin: 2.9 g/dL — ABNORMAL LOW (ref 3.5–5.2)
Alkaline Phosphatase: 45 U/L (ref 39–117)
BUN: 12 mg/dL (ref 6–23)
Calcium: 8.6 mg/dL (ref 8.4–10.5)
GFR calc Af Amer: >90 mL/min (ref >90)
Glucose, Bld: 154 mg/dL — ABNORMAL HIGH (ref 70–99)
Potassium: 3.9 mEq/L (ref 3.5–5.1)
Sodium: 135 mEq/L (ref 135–145)
Total Protein: 6.7 g/dL (ref 6.0–8.3)

## 2012-03-03 MED ORDER — DEXTROSE-NACL 5-0.9 % IV SOLN
INTRAVENOUS | Status: DC
  Administered 2012-03-03: 11:00:00 via INTRAVENOUS
  Administered 2012-03-03: 100 mL/h via INTRAVENOUS
  Administered 2012-03-03 – 2012-03-04 (×2): via INTRAVENOUS

## 2012-03-03 MED ORDER — OXYCODONE-ACETAMINOPHEN 5-325 MG PO TABS
1.0000 | ORAL_TABLET | ORAL | Status: DC | PRN
  Administered 2012-03-06: 2 via ORAL
  Filled 2012-03-03: qty 2

## 2012-03-03 MED ORDER — HYDROMORPHONE HCL PF 1 MG/ML IJ SOLN
1.0000 mg | INTRAMUSCULAR | Status: DC | PRN
  Administered 2012-03-03 (×5): 1 mg via INTRAVENOUS
  Filled 2012-03-03 (×5): qty 1

## 2012-03-03 MED ORDER — ONDANSETRON HCL 4 MG PO TABS: 4.0000 mg | ORAL_TABLET | Freq: Four times a day (QID) | ORAL | Status: DC | PRN

## 2012-03-03 MED ORDER — IBUPROFEN 600 MG PO TABS
600.0000 mg | ORAL_TABLET | Freq: Four times a day (QID) | ORAL | Status: DC | PRN
  Administered 2012-03-04: 600 mg via ORAL
  Filled 2012-03-03: qty 1

## 2012-03-03 MED ORDER — DEXTROSE 5 % IV SOLN
2.0000 g | Freq: Four times a day (QID) | INTRAVENOUS | Status: DC
  Administered 2012-03-03 – 2012-03-05 (×9): 2 g via INTRAVENOUS
  Filled 2012-03-03 (×12): qty 2

## 2012-03-03 MED ORDER — ONDANSETRON HCL 4 MG/2ML IJ SOLN: 4.0000 mg | Freq: Four times a day (QID) | INTRAMUSCULAR | Status: DC | PRN

## 2012-03-03 MED ORDER — ENOXAPARIN SODIUM 40 MG/0.4ML ~~LOC~~ SOLN
40.0000 mg | Freq: Every day | SUBCUTANEOUS | Status: DC
  Administered 2012-03-03 – 2012-03-05 (×3): 40 mg via SUBCUTANEOUS
  Filled 2012-03-03 (×4): qty 0.4

## 2012-03-03 NOTE — Progress Notes (Signed)
1 Day Post-Op  Subjective: Sore. Tired. Not much energy. No n/v. Drinking some liquids  Objective: Vital signs in last 24 hours: Temp:  [98.1 F (36.7 C)-100.7 F (38.2 C)] 98.5 F (36.9 C) (11/30 0526) Pulse Rate:  [93-103] 96  (11/30 0526) Resp:  [13-20] 18  (11/30 0526) BP: (112-145)/(71-97) 112/71 mmHg (11/30 0526) SpO2:  [94 %-98 %] 97 % (11/30 0526) Weight:  [190 lb (86.183 kg)] 190 lb (86.183 kg) (11/30 0015) Last BM Date: 03/01/12  Intake/Output from previous day: 11/29 0701 - 11/30 0700 In: 2208.3 [P.O.:60; I.V.:2108.3] Out: 1050 [Urine:1010; Drains:40] Intake/Output this shift:    Alert, appropriate, nad Soft, full, approp TTP. Incisions c/d/i. Drain - serosang SCDs  Lab Results:   Basename 03/03/12 0645 03/02/12 1253  WBC 16.0* 16.4*  HGB 13.9 14.4  HCT 41.2 46.6  PLT 205 --   BMET  Basename 03/03/12 0645 03/02/12 1417  NA 135 --  K 3.9 --  CL 101 --  CO2 26 --  GLUCOSE 154* --  BUN 12 15  CREATININE 0.84 0.87  CALCIUM 8.6 --   PT/INR No results found for this basename: LABPROT:2,INR:2 in the last 72 hours ABG No results found for this basename: PHART:2,PCO2:2,PO2:2,HCO3:2 in the last 72 hours  Studies/Results: Ct Abdomen Pelvis W Contrast  03/02/2012  *RADIOLOGY REPORT*  Clinical Data: Right lower quadrant abdominal pain.  CT ABDOMEN AND PELVIS WITH CONTRAST  Technique:  Multidetector CT imaging of the abdomen and pelvis was performed following the standard protocol during bolus administration of intravenous contrast.  Contrast: OMNIPAQUE IOHEXOL 300 MG/ML  SOLN  Comparison: None.  Findings: The liver, spleen, pancreas, and adrenal glands appear unremarkable.  Several small gallstones are present, each measuring about 4 mm in diameter.  No gallbladder wall thickening or pericholecystic fluid observed.  No extrahepatic biliary dilatation.  No pathologic retroperitoneal or porta hepatis adenopathy is identified.  There is a 2 mm right kidney  lower pole nonobstructive calculus and a 3 mm left mid kidney nonobstructive calculus.  There is a 2 mm left kidney lower pole nonobstructive calculus.  No hydronephrosis or hydroureter.  Acute appendicitis is present, with the appendix measuring 16 mm in diameter and with extensive peri-appendiceal stranding.  Adjacent wall thickening of the distal ileum is likely reactive.  The terminal ileum does not appear inflamed.  A small amount of fluid tracks along the right pericolic gutter. No extraluminal gas observed.  Descending and sigmoid diverticulosis noted without active diverticulitis.  Urinary bladder unremarkable. 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  IMPRESSION:  1.  Acute appendicitis with adjacent periappendiceal fluid but without discrete abscess observed. 2.  Cholelithiasis. 3.  Bilateral nonobstructive nephrolithiasis. 4.  Descending and sigmoid colon diverticulosis.   Original Report Authenticated By: Gaylyn Rong, M.D.     Anti-infectives: Anti-infectives     Start     Dose/Rate Route Frequency Ordered Stop   03/03/12 0600   cefOXitin (MEFOXIN) 2 g in dextrose 5 % 50 mL IVPB  Status:  Discontinued        2 g 100 mL/hr over 30 Minutes Intravenous On call to O.R. 03/02/12 2139 03/03/12 0009   03/03/12 0400   cefOXitin (MEFOXIN) 2 g in dextrose 5 % 50 mL IVPB        2 g 100 mL/hr over 30 Minutes Intravenous Every 6 hours 03/03/12 0045            Assessment/Plan: s/p Procedure(s) (LRB) with comments:  APPENDECTOMY LAPAROSCOPIC (N/A) for ruptured appendicitis Clears, adv diet as tolerated IS, pulm toilet, OOB Cont IV abx for several days given perforation VTE prophy - lovenox  Mary Sella. Andrey Campanile, MD, FACS General, Bariatric, & Minimally Invasive Surgery [facility], [place]   LOS: 1 day    Atilano Ina 03/03/2012

## 2012-03-03 NOTE — ED Provider Notes (Signed)
Medical screening examination/treatment/procedure(s) were conducted as a shared visit with non-physician practitioner(s) and myself.  I personally evaluated the patient during the encounter 56 yo man with abdominal pain, elevated WBC, and CT scan that showed acute appendicitis, seen by Dr. Luisa Hart of General Surgery.       Carleene Cooper III, MD 03/03/12 1001

## 2012-03-04 LAB — CBC
HCT: 37.3 % — ABNORMAL LOW (ref 39.0–52.0)
Hemoglobin: 12.7 g/dL — ABNORMAL LOW (ref 13.0–17.0)
RDW: 13.1 % (ref 11.5–15.5)
WBC: 11.5 10*3/uL — ABNORMAL HIGH (ref 4.0–10.5)

## 2012-03-04 LAB — BASIC METABOLIC PANEL
BUN: 9 mg/dL (ref 6–23)
Chloride: 105 mEq/L (ref 96–112)
GFR calc Af Amer: >90 mL/min (ref >90)
Potassium: 3.3 mEq/L — ABNORMAL LOW (ref 3.5–5.1)
Sodium: 140 mEq/L (ref 135–145)

## 2012-03-04 MED ORDER — POTASSIUM CHLORIDE CRYS ER 20 MEQ PO TBCR
40.0000 meq | EXTENDED_RELEASE_TABLET | Freq: Once | ORAL | Status: AC
  Administered 2012-03-04: 40 meq via ORAL
  Filled 2012-03-04: qty 2

## 2012-03-04 NOTE — Progress Notes (Signed)
2 Days Post-Op  Subjective: Doing well. No n/v. No flatus. "weak". Tolerating clears  Objective: Vital signs in last 24 hours: Temp:  [97.9 F (36.6 C)-99.2 F (37.3 C)] 98.5 F (36.9 C) (12/01 0212) Pulse Rate:  [73-96] 95  (12/01 0212) Resp:  [18-20] 18  (12/01 0212) BP: (111-144)/(69-83) 114/76 mmHg (12/01 0212) SpO2:  [96 %-98 %] 96 % (12/01 0212) Last BM Date: 02/29/12  Intake/Output from previous day: 11/30 0701 - 12/01 0700 In: 2560 [P.O.:960; I.V.:1600] Out: -  Intake/Output this shift:    Sitting in chair, nad cta b/l Reg Soft, full, hypobs. Incisions c/d/i No edema  Lab Results:   Basename 03/04/12 0630 03/03/12 0645  WBC 11.5* 16.0*  HGB 12.7* 13.9  HCT 37.3* 41.2  PLT 211 205   BMET  Basename 03/04/12 0630 03/03/12 0645  NA 140 135  K 3.3* 3.9  CL 105 101  CO2 28 26  GLUCOSE 130* 154*  BUN 9 12  CREATININE 0.75 0.84  CALCIUM 8.2* 8.6   PT/INR No results found for this basename: LABPROT:2,INR:2 in the last 72 hours ABG No results found for this basename: PHART:2,PCO2:2,PO2:2,HCO3:2 in the last 72 hours  Studies/Results: Ct Abdomen Pelvis W Contrast  03/02/2012  *RADIOLOGY REPORT*  Clinical Data: Right lower quadrant abdominal pain.  CT ABDOMEN AND PELVIS WITH CONTRAST  Technique:  Multidetector CT imaging of the abdomen and pelvis was performed following the standard protocol during bolus administration of intravenous contrast.  Contrast: OMNIPAQUE IOHEXOL 300 MG/ML  SOLN  Comparison: None.  Findings: The liver, spleen, pancreas, and adrenal glands appear unremarkable.  Several small gallstones are present, each measuring about 4 mm in diameter.  No gallbladder wall thickening or pericholecystic fluid observed.  No extrahepatic biliary dilatation.  No pathologic retroperitoneal or porta hepatis adenopathy is identified.  There is a 2 mm right kidney lower pole nonobstructive calculus and a 3 mm left mid kidney nonobstructive calculus.  There  is a 2 mm left kidney lower pole nonobstructive calculus.  No hydronephrosis or hydroureter.  Acute appendicitis is present, with the appendix measuring 16 mm in diameter and with extensive peri-appendiceal stranding.  Adjacent wall thickening of the distal ileum is likely reactive.  The terminal ileum does not appear inflamed.  A small amount of fluid tracks along the right pericolic gutter. No extraluminal gas observed.  Descending and sigmoid diverticulosis noted without active diverticulitis.  Urinary bladder unremarkable. 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  IMPRESSION:  1.  Acute appendicitis with adjacent periappendiceal fluid but without discrete abscess observed. 2.  Cholelithiasis. 3.  Bilateral nonobstructive nephrolithiasis. 4.  Descending and sigmoid colon diverticulosis.   Original Report Authenticated By: Gaylyn Rong, M.D.     Anti-infectives: Anti-infectives     Start     Dose/Rate Route Frequency Ordered Stop   03/03/12 0600   cefOXitin (MEFOXIN) 2 g in dextrose 5 % 50 mL IVPB  Status:  Discontinued        2 g 100 mL/hr over 30 Minutes Intravenous On call to O.R. 03/02/12 2139 03/03/12 0009   03/03/12 0400   cefOXitin (MEFOXIN) 2 g in dextrose 5 % 50 mL IVPB        2 g 100 mL/hr over 30 Minutes Intravenous Every 6 hours 03/03/12 0045            Assessment/Plan: s/p Procedure(s) (LRB) with comments: APPENDECTOMY LAPAROSCOPIC (N/A) Cont IV abx secondary to perforation Adv diet to fulls  Decrease mIVF Hypokalemia - replace Potassium with Kdur Needs 2 more days of iv abx then finish po course for a total of 10 days Hopefully home Tues  Lynde Ludwig M. Andrey Campanile, MD, FACS General, Bariatric, & Minimally Invasive Surgery [facility], [place]   LOS: 2 days    Douglas Norris 03/04/2012

## 2012-03-04 NOTE — Progress Notes (Addendum)
Pt was ambulating in hall when Jp drain started leaking around insertion site. Rn replaced old dressing and milked tubing. Will continue to monitor/

## 2012-03-05 ENCOUNTER — Encounter (HOSPITAL_COMMUNITY): Payer: Self-pay | Admitting: Surgery

## 2012-03-05 MED ORDER — AMOXICILLIN-POT CLAVULANATE 875-125 MG PO TABS
1.0000 | ORAL_TABLET | Freq: Two times a day (BID) | ORAL | Status: DC
  Administered 2012-03-05 – 2012-03-06 (×3): 1 via ORAL
  Filled 2012-03-05 (×4): qty 1

## 2012-03-05 NOTE — Progress Notes (Signed)
Patient ID: Douglas Norris, male   DOB: [birth year], 56 y.o.   MRN: [id number] 3 Days Post-Op  Subjective: Pt feels ok.  Hasn't taken pain meds in 24hrs. Tolerating full liquids.  Ambulating well.  Objective: Vital signs in last 24 hours: Temp:  [98.4 F (36.9 C)-98.9 F (37.2 C)] 98.7 F (37.1 C) (12/02 0630) Pulse Rate:  [80-93] 84  (12/02 0630) Resp:  [18-20] 18  (12/02 0630) BP: (121-134)/(74-87) 134/87 mmHg (12/02 0630) SpO2:  [96 %-99 %] 98 % (12/02 0630) Last BM Date: 02/29/12  Intake/Output from previous day: 12/01 0701 - 12/02 0700 In: 1949.8 [P.O.:480; I.V.:1469.8] Out: 100 [Urine:60; Drains:40] Intake/Output this shift:    PE: Abd: soft on left, but still feels tight on right.  Some distention, but great BS, drain with serosang output.  Incisions c/d/i with dermabond  Lab Results:   Basename 03/04/12 0630 03/03/12 0645  WBC 11.5* 16.0*  HGB 12.7* 13.9  HCT 37.3* 41.2  PLT 211 205   BMET  Basename 03/04/12 0630 03/03/12 0645  NA 140 135  K 3.3* 3.9  CL 105 101  CO2 28 26  GLUCOSE 130* 154*  BUN 9 12  CREATININE 0.75 0.84  CALCIUM 8.2* 8.6   PT/INR No results found for this basename: LABPROT:2,INR:2 in the last 72 hours CMP     Component Value Date/Time   NA 140 03/04/2012 0630   K 3.3* 03/04/2012 0630   CL 105 03/04/2012 0630   CO2 28 03/04/2012 0630   GLUCOSE 130* 03/04/2012 0630   BUN 9 03/04/2012 0630   CREATININE 0.75 03/04/2012 0630   CALCIUM 8.2* 03/04/2012 0630   PROT 6.7 03/03/2012 0645   ALBUMIN 2.9* 03/03/2012 0645   AST 17 03/03/2012 0645   ALT 13 03/03/2012 0645   ALKPHOS 45 03/03/2012 0645   BILITOT 0.6 03/03/2012 0645   GFRNONAA >90 03/04/2012 0630   GFRAA >90 03/04/2012 0630   Lipase  No results found for this basename: lipase       Studies/Results: No results found.  Anti-infectives: Anti-infectives     Start     Dose/Rate Route Frequency Ordered Stop   03/05/12 1000   amoxicillin-clavulanate (AUGMENTIN) 875-125 MG per  tablet 1 tablet        1 tablet Oral Every 12 hours 03/05/12 0858     03/03/12 0600   cefOXitin (MEFOXIN) 2 g in dextrose 5 % 50 mL IVPB  Status:  Discontinued        2 g 100 mL/hr over 30 Minutes Intravenous On call to O.R. 03/02/12 2139 03/03/12 0009   03/03/12 0400   cefOXitin (MEFOXIN) 2 g in dextrose 5 % 50 mL IVPB  Status:  Discontinued        2 g 100 mL/hr over 30 Minutes Intravenous Every 6 hours 03/03/12 0045 03/05/12 0858           Assessment/Plan  1. S/p lap appy for perf appendicitis  Plan: 1. Advance to regular diet today, hopefully can go home tomorrow 2. Will dc drain if remains clear prior to dc home. 3. Watch belly as he still seems fairly bloated to me.   LOS: 3 days    Tylek Boney E 03/05/2012, 8:58 AM Pager: [phone]

## 2012-03-05 NOTE — Progress Notes (Signed)
Ileus resolving slowly. Home when tolerating reg diet.

## 2012-03-06 MED ORDER — OXYCODONE-ACETAMINOPHEN 5-325 MG PO TABS: 1.0000 | ORAL_TABLET | ORAL | Status: DC | PRN

## 2012-03-06 MED ORDER — AMOXICILLIN-POT CLAVULANATE 875-125 MG PO TABS: 1.0000 | ORAL_TABLET | Freq: Two times a day (BID) | ORAL | Status: DC

## 2012-03-06 NOTE — Discharge Summary (Signed)
Patient ID: Douglas Norris MRN: [id number] DOB/AGE: 12/26/1955 56 y.o.  Admit date: 03/02/2012 Discharge date: 03/06/2012  Procedures: laparoscopic appendectomy with drain placement  Consults: None  Reason for Admission: 1 day history of RLQ abdominal pain. Sharp in nature. Worse with movement. CT showed acute appendicitis  Admission Diagnoses:  1. Acute appendicitis  Hospital Course: The patient was admitted and taken to the operating room where he was found to have a ruptured appendix.  This was removed laparoscopically and a drain was placed.  Postoperatively , the patient did well.  His diet was able to be advanced as tolerated.  His drain remained serosang in output.  This was able to be removed on POD# 3 prior to dc home.  He otherwise was afebrile and doing well.  PE: Abd: much softer today, +BS, ND, incisions c/d/i, jp with serosang output.  This will be removed.  Discharge Diagnoses:  1, acute perforated appendicitis 2. S/p lap appy  Discharge Medications:   Medication List     As of 03/06/2012  9:32 AM    STOP taking these medications         acetaminophen 500 MG tablet   Commonly known as: TYLENOL      TAKE these medications         amoxicillin-clavulanate 875-125 MG per tablet   Commonly known as: AUGMENTIN   Take 1 tablet by mouth every 12 (twelve) hours.      aspirin 325 MG tablet   Take 325 mg by mouth daily.      oxyCODONE-acetaminophen 5-325 MG per tablet   Commonly known as: PERCOCET/ROXICET   Take 1-2 tablets by mouth every 4 (four) hours as needed.        Discharge Instructions:     Follow-up Information    Follow up with [facility]. On 03/20/2012. (3:30pm, arrive at 3:00pm)    Contact information:   [address]   [place] [address] [phone]          Signed: Letha Cape 03/06/2012, 9:32 AM

## 2012-03-06 NOTE — Discharge Summary (Signed)
Agree with above 

## 2012-03-20 ENCOUNTER — Ambulatory Visit (INDEPENDENT_AMBULATORY_CARE_PROVIDER_SITE_OTHER): Payer: Self-pay | Admitting: General Surgery

## 2012-03-20 ENCOUNTER — Encounter (INDEPENDENT_AMBULATORY_CARE_PROVIDER_SITE_OTHER): Payer: Self-pay

## 2012-03-20 VITALS — BP 142/92 | HR 76 | Temp 98.1°F | Ht 69.0 in | Wt 192.8 lb

## 2012-03-20 DIAGNOSIS — K3580 Unspecified acute appendicitis: Secondary | ICD-10-CM

## 2012-03-20 NOTE — Patient Instructions (Signed)
Laparoscopic Appendectomy Appendectomy is surgery to remove the appendix. Laparoscopic surgery uses several small cuts (incisions) instead of one large incision. Laparoscopic surgery offers a shorter recovery time and less discomfort. LET YOUR CAREGIVER KNOW ABOUT:  Allergies to food or medicine.  Medicines taken, including vitamins, dietary supplements, herbs, eyedrops, over-the-counter medicines, and creams.  Use of steroids (by mouth or creams).  Previous problems with anesthetics or numbing medicines.  History of bleeding problems or blood clots.  Previous surgery.  Other health problems, including diabetes, heart problems, lung problems, and kidney problems.  Possibility of pregnancy, if this applies. RISKS AND COMPLICATIONS  Infection. A germ starts growing in the wound. This can usually be treated with antibiotics. In some cases, the wound will need to be opened and cleaned.  Bleeding.  Damage to other organs.  Sores (abscesses).  Chronic pain at the incision sites. This is defined as pain that lasts for more than 3 months.  Blood clots in the legs that may rarely travel to the lungs.  Infection in the lungs (pneumonia). BEFORE THE PROCEDURE Appendectomy is usually performed immediately after an inflamed appendix (appendicitis) is diagnosed. No preparation is necessary ahead of this procedure. PROCEDURE  You will be given medicine that makes you sleep (general anesthetic). After you are asleep, a flexible tube (catheter) may be inserted into your bladder to drain your urine during surgery. The tube is removed before you wake up after surgery. When you are asleep, carbondioxide gas will be used to inflate your abdomen. This will allow your surgeon to see inside your abdomen and perform your surgery. Three small incisions will be made in your abdomen. Your surgeon will insert a thin, lighted tube (laparoscope) through one of the incisions. Your surgeon will look through the  laparoscope while performing the surgery. Other tools will be inserted through the other incisions. Laparoscopic procedures may not be appropriate when:  There is major scarring from a previous surgery.  The patient has bleeding disorders.  A pregnancy is near term.  There are other conditions which make the laparoscopic procedure impossible, such as an advanced infection or a ruptured appendix. If your surgeon feels it is not safe to continue with the laparoscopic procedure, he or she will perform an open surgery instead. This gives the surgeon a larger view and more space to work. Open surgery requires a longer recovery time. After your appendix is removed, your incisions will be closed with stitches (sutures) or skin adhesive. AFTER THE PROCEDURE You will be taken to a recovery room. When the anesthesia has worn off, you will be returned to your hospital room. You will be given pain medicines to keep you comfortable. Ask your caregiver how long your hospital stay will be. Document Released: 11/03/2003 Document Revised: 06/13/2011 Document Reviewed: 09/28/2010 [facility] Patient Information 2013 [place], [place].  Call if you have anymore problems.

## 2012-03-20 NOTE — Progress Notes (Signed)
Douglas Norris 1955/12/01 [id number] 03/20/2012   History of Present Illness: Douglas Norris is a  56 y.o. male who presents today status post lap appy by Dr. Harriette Bouillon.  Pathology reveals Acute full thickness appendicitis with periappendiceal abscess formation and serositis, NO Tumor seen. The patient is tolerating a regular diet, having normal bowel movements, has good pain control.  He  is back to most normal activities.   Physical Exam: Abd: soft, nontender, active bowel sounds, nondistended.  All incisions are well healed. BP 142/92  Pulse 76  Temp 98.1 F (36.7 C) (Temporal)  Ht 5\' 9"  (1.753 m)  Wt 192 lb 12.8 oz (87.454 kg)  BMI 28.47 kg/m2  SpO2 97% BP is up and we talked about that.  He needs primary care to follow up with that.  Prior to Admission medications   Not on File    Impression: 1.  Acute appendicitis, s/p lap appy  Plan: He  is able to return to normal activities. He  may follow up on a prn basis.

## 2015-11-13 ENCOUNTER — Ambulatory Visit: Payer: Self-pay

## 2015-12-28 ENCOUNTER — Ambulatory Visit (INDEPENDENT_AMBULATORY_CARE_PROVIDER_SITE_OTHER): Payer: BLUE CROSS/BLUE SHIELD | Admitting: Family Medicine

## 2015-12-28 VITALS — BP 122/88 | HR 89 | Temp 98.3°F | Resp 17 | Ht 69.0 in | Wt 191.0 lb

## 2015-12-28 DIAGNOSIS — M25512 Pain in left shoulder: Secondary | ICD-10-CM | POA: Diagnosis not present

## 2015-12-28 DIAGNOSIS — M25511 Pain in right shoulder: Secondary | ICD-10-CM

## 2015-12-28 DIAGNOSIS — M542 Cervicalgia: Secondary | ICD-10-CM

## 2015-12-28 MED ORDER — MELOXICAM 15 MG PO TABS: 15.0000 mg | ORAL_TABLET | Freq: Every day | ORAL | 1 refills | Status: DC

## 2015-12-28 MED ORDER — PREDNISONE 20 MG PO TABS: ORAL_TABLET | ORAL | 0 refills | Status: DC

## 2015-12-28 NOTE — Patient Instructions (Addendum)
Take Prednisone 20 mg,  in mornings with breakfast as follows:  Take 3 pills for 3 days, Take 2 pills for 3 days, and Take 1 pill for 3 days.  Complete all medication.  After prednisone taper is complete, you may take Meloxicam 15 mg once daily as needed for pain.  I have submitted referral for orthopedics.  They will follow-up with you to schedule an appointment.    IF you received an x-ray today, you will receive an invoice from [facility]. Please contact [facility] at [phone] with questions or concerns regarding your invoice.   IF you received labwork today, you will receive an invoice from [employer]. Please contact [facility] at [phone] with questions or concerns regarding your invoice.   Our billing staff will not be able to assist you with questions regarding bills from these companies.  You will be contacted with the lab results as soon as they are available. The fastest way to get your results is to activate your My Chart account. Instructions are located on the last page of this paperwork. If you have not heard from [place] regarding the results in 2 weeks, please contact this office.
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
## 2016-06-17 ENCOUNTER — Ambulatory Visit (INDEPENDENT_AMBULATORY_CARE_PROVIDER_SITE_OTHER): Payer: BLUE CROSS/BLUE SHIELD | Admitting: Physician Assistant

## 2016-06-17 ENCOUNTER — Ambulatory Visit (INDEPENDENT_AMBULATORY_CARE_PROVIDER_SITE_OTHER): Payer: BLUE CROSS/BLUE SHIELD

## 2016-06-17 VITALS — BP 126/82 | HR 83 | Temp 98.6°F | Resp 18 | Ht 69.0 in | Wt 198.0 lb

## 2016-06-17 DIAGNOSIS — R221 Localized swelling, mass and lump, neck: Secondary | ICD-10-CM | POA: Diagnosis not present

## 2016-06-17 LAB — POCT CBC
Granulocyte percent: 72 %G (ref 37–80)
HEMATOCRIT: 43.1 % — AB (ref 43.5–53.7)
HEMOGLOBIN: 15.4 g/dL (ref 14.1–18.1)
LYMPH, POC: 1.6 (ref 0.6–3.4)
MCH: 31.4 pg — AB (ref 27–31.2)
MCHC: 35.8 g/dL — AB (ref 31.8–35.4)
MCV: 87.6 fL (ref 80–97)
MID (CBC): 0.8 (ref 0–0.9)
MPV: 8.5 fL (ref 0–99.8)
POC GRANULOCYTE: 6.3 (ref 2–6.9)
POC LYMPH PERCENT: 18.5 %L (ref 10–50)
POC MID %: 9.5 % (ref 0–12)
Platelet Count, POC: 248 10*3/uL (ref 142–424)
RBC: 4.92 M/uL (ref 4.69–6.13)
RDW, POC: 12.7 %
WBC: 8.8 10*3/uL (ref 4.6–10.2)

## 2016-06-17 LAB — POCT SEDIMENTATION RATE: POCT SED RATE: 26 mm/hr — AB (ref 0–22)

## 2016-06-17 NOTE — Progress Notes (Signed)
06/18/2016 7:49 PM   DOB: 30-Nov-1955 / MRN: [id number]  SUBJECTIVE:  Douglas Norris is a 61 y.o. male presenting for a mass on the left side of the neck that started about 6 months ago.  He is a former smoker.  Denies weight loss, night sweat, fatigue.  He typically exercises daily and notes no change in his ability to exercise. He has a 40 pack year history of smoking. He does report some cat scratches. He denies difficulty swallowing but does complain of some mild sore throat that started 4-5 days ago. He also complains of some mild and transient left sided ear pain.   He has No Known Allergies.   He  has a past medical history of Heart murmur.    He  reports that he quit smoking about 12 years ago. He has never used smokeless tobacco. He reports that he does not drink alcohol or use drugs. He  reports that he currently engages in sexual activity. The patient  has a past surgical history that includes laparoscopic appendectomy (03/02/2012) and Appendectomy (03/02/12).  His family history includes Cancer in his father, mother, sister, and sister.  Review of Systems  Constitutional: Negative for fever.  Skin: Negative for itching and rash.  Neurological: Negative for dizziness.    The problem list and medications were reviewed and updated by myself where necessary and exist elsewhere in the encounter.   OBJECTIVE:  BP 126/82   Pulse 83   Temp 98.6 F (37 C) (Oral)   Resp 18   Ht 5\' 9"  (1.753 m)   Wt 198 lb (89.8 kg)   SpO2 97%   BMI 29.24 kg/m   Wt Readings from Last 3 Encounters:  06/17/16 198 lb (89.8 kg)  12/28/15 191 lb (86.6 kg)  03/20/12 192 lb 12.8 oz (87.5 kg)    Lab Results  Component Value Date   WBC 8.8 06/17/2016   HGB 15.4 06/17/2016   HCT 43.1 (A) 06/17/2016   MCV 87.6 06/17/2016   PLT 211 03/04/2012     Physical Exam  Constitutional: He appears well-developed and well-nourished. No distress.  HENT:  Right Ear: Tympanic membrane normal.  Left Ear:  Tympanic membrane normal.  Mouth/Throat: Uvula is midline, oropharynx is clear and moist and mucous membranes are normal.  Dentition in poor repair.  Gums negative for TTP.    Neck:    Cardiovascular: Normal rate, regular rhythm, normal heart sounds and intact distal pulses.   Pulmonary/Chest: Effort normal and breath sounds normal.  Musculoskeletal: Normal range of motion.  Neurological: He is alert.  Skin: He is not diaphoretic.      Results for orders placed or performed in visit on 06/17/16 (from the past 72 hour(s))  POCT CBC     Status: Abnormal   Collection Time: 06/17/16  3:01 PM  Result Value Ref Range   WBC 8.8 4.6 - 10.2 K/uL   Lymph, poc 1.6 0.6 - 3.4   POC LYMPH PERCENT 18.5 10 - 50 %L   MID (cbc) 0.8 0 - 0.9   POC MID % 9.5 0 - 12 %M   POC Granulocyte 6.3 2 - 6.9   Granulocyte percent 72.0 37 - 80 %G   RBC 4.92 4.69 - 6.13 M/uL   Hemoglobin 15.4 14.1 - 18.1 g/dL   HCT, POC 43.1 (A) 43.5 - 53.7 %   MCV 87.6 80 - 97 fL   MCH, POC 31.4 (A) 27 - 31.2 pg   MCHC 35.8 (A)  31.8 - 35.4 g/dL   RDW, POC 12.7 %   Platelet Count, POC 248 142 - 424 K/uL   MPV 8.5 0 - 99.8 fL  Basic metabolic panel     Status: Abnormal   Collection Time: 06/17/16  3:07 PM  Result Value Ref Range   Glucose 86 65 - 99 mg/dL   BUN 19 6 - 24 mg/dL   Creatinine, Ser 0.88 0.76 - 1.27 mg/dL   GFR calc non Af Amer 95 >59 mL/min/1.73   GFR calc Af Amer 109 >59 mL/min/1.73   BUN/Creatinine Ratio 22 (H) 9 - 20   Sodium 140 134 - 144 mmol/L   Potassium 4.1 3.5 - 5.2 mmol/L   Chloride 98 96 - 106 mmol/L   CO2 24 18 - 29 mmol/L   Calcium 9.3 8.7 - 10.2 mg/dL  POCT SEDIMENTATION RATE     Status: Abnormal   Collection Time: 06/17/16  4:00 PM  Result Value Ref Range   POCT SED RATE 26 (A) 0 - 22 mm/hr    No results found.  Lab Results  Component Value Date   CREATININE 0.88 06/17/2016   BUN 19 06/17/2016   NA 140 06/17/2016   K 4.1 06/17/2016   CL 98 06/17/2016   CO2 24 06/17/2016      ASSESSMENT AND PLAN:  Douglas Norris was seen today for mass.  Diagnoses and all orders for this visit:  Neck mass: I am very concerned about his presentation today mostly with regard to malignancy.  CBC WNL and reassuring.   Will complete medical work up with blood work and scan, and will get him to ENT who will most likely biopsy.   -     POCT CBC -     POCT SEDIMENTATION RATE -     DG Neck Soft Tissue; Future -     CT Soft Tissue Neck W Contrast; Future -     Ambulatory referral to ENT -     Basic metabolic panel -     Bartonella Antibody Panel -     Epstein-Barr virus VCA antibody panel    The patient is advised to call or return to clinic if he does not see an improvement in symptoms, or to seek the care of the closest emergency department if he worsens with the above plan.   Philis Fendt, MHS, PA-C [facility] 06/18/2016 7:49 PM

## 2016-06-17 NOTE — Patient Instructions (Signed)
     IF you received an x-ray today, you will receive an invoice from [facility]. Please contact [facility] at [phone] with questions or concerns regarding your invoice.   IF you received labwork today, you will receive an invoice from [facility]. Please contact [facility] at [phone] with questions or concerns regarding your invoice.   Our billing staff will not be able to assist you with questions regarding bills from these companies.  You will be contacted with the lab results as soon as they are available. The fastest way to get your results is to activate your My Chart account. Instructions are located on the last page of this paperwork. If you have not heard from us regarding the results in 2 weeks, please contact this office.     

## 2016-06-18 LAB — BASIC METABOLIC PANEL
BUN / CREAT RATIO: 22 — AB (ref 9–20)
BUN: 19 mg/dL (ref 6–24)
CALCIUM: 9.3 mg/dL (ref 8.7–10.2)
CHLORIDE: 98 mmol/L (ref 96–106)
CO2: 24 mmol/L (ref 18–29)
CREATININE: 0.88 mg/dL (ref 0.76–1.27)
GFR calc Af Amer: 109 mL/min/{1.73_m2} (ref >59)
GFR calc non Af Amer: 95 mL/min/{1.73_m2} (ref >59)
Glucose: 86 mg/dL (ref 65–99)
Potassium: 4.1 mmol/L (ref 3.5–5.2)
Sodium: 140 mmol/L (ref 134–144)

## 2016-06-20 LAB — EPSTEIN-BARR VIRUS VCA ANTIBODY PANEL
EBV EARLY ANTIGEN AB, IGG: 49.8 U/mL — AB (ref 0.0–8.9)
EBV NA IGG: 267 U/mL — AB (ref 0.0–17.9)
EBV VCA IgG: 555 U/mL — ABNORMAL HIGH (ref 0.0–17.9)

## 2016-06-20 LAB — BARTONELLA ANTIBODY PANEL
B Quintana IgM: NEGATIVE titer
B henselae IgG: NEGATIVE titer
B henselae IgM: NEGATIVE titer
B quintana IgG: NEGATIVE titer

## 2016-06-27 ENCOUNTER — Ambulatory Visit
Admission: RE | Admit: 2016-06-27 | Discharge: 2016-06-27 | Disposition: A | Discharge status: Home / Self Care | Payer: BLUE CROSS/BLUE SHIELD | Source: Ambulatory Visit | Attending: Physician Assistant | Admitting: Physician Assistant

## 2016-06-27 DIAGNOSIS — R221 Localized swelling, mass and lump, neck: Secondary | ICD-10-CM

## 2016-06-27 MED ORDER — IOPAMIDOL (ISOVUE-300) INJECTION 61%
75.0000 mL | Freq: Once | INTRAVENOUS | Status: AC | PRN
  Administered 2016-06-27: 75 mL via INTRAVENOUS

## 2016-06-28 ENCOUNTER — Other Ambulatory Visit: Payer: Self-pay | Admitting: Otolaryngology

## 2016-06-29 ENCOUNTER — Other Ambulatory Visit (HOSPITAL_COMMUNITY): Payer: Self-pay | Admitting: Otolaryngology

## 2016-06-29 DIAGNOSIS — C099 Malignant neoplasm of tonsil, unspecified: Secondary | ICD-10-CM

## 2016-07-01 ENCOUNTER — Encounter: Payer: Self-pay | Admitting: Radiation Oncology

## 2016-07-01 ENCOUNTER — Telehealth: Payer: Self-pay | Admitting: *Deleted

## 2016-07-01 NOTE — Telephone Encounter (Signed)
Oncology Nurse Navigator Documentation  Placed introductory call to new referral patient.  Introduced myself as the H&N oncology nurse navigator that works with Dr. Isidore Moos to whom he has been referred by Dr. Lucia Gaskins.  He confirmed his understanding of referral and appt date/time of 07/13/16 3:00/3:30.  I briefly explained my role as his navigator, indicated that I would be joining him during his appt next week.  I confirmed his understanding of [place] location, explained arrival and RadOnc registration process for appt.  I answered his questions re treatment for tonsil cancer.  I provided my contact information, encouraged him to call with questions/concerns before his appt. He verbalized understanding of information provided, expressed appreciation for my call.   Gayleen Orem, RN, BSN, [place] [facility] Oncology Nurse [place] at [place] [phone]

## 2016-07-01 NOTE — Progress Notes (Addendum)
Head and Neck Cancer Location of Tumor / Histology:  06/28/16 Tonsil, biopsy, left - INVASIVE SQUAMOUS CELL CARCINOMA The malignant cells are strongly and diffusely positive for p16.  Patient presented  with symptoms of: Left mandibular mass for 4 months.   Biopsies of Left Tonsil revealed: Invasive squamous cell carcinoma.   Nutrition Status Yes No Comments  Weight changes? []  []    Swallowing concerns? []  []    PEG? []  []     Referrals Yes No Comments  Social Work? []  []    Dentistry? [x]  []  07/08/16 Dr. Enrique Sack. He recommends dental extractions, however pt declined pending evaluation by Dr. Isidore Moos and Dr. Alvy Bimler  Swallowing therapy? []  []    Nutrition? []  []    Med/Onc? [x]  []  Dr. Alvy Bimler 07/13/16   Safety Issues Yes No Comments  Prior radiation? []  []    Pacemaker/ICD? []  []    Possible current pregnancy? []  []    Is the patient on methotrexate? []  []     Tobacco/Marijuana/Snuff/ETOH use: He is a former smoker. No alcohol use  Past/Anticipated interventions by otolaryngology, if any: Dr. Lucia Gaskins  Past/Anticipated interventions by medical oncology, if any:  07/13/16 Dr. Alvy Bimler   Current Complaints / other details:   PET 07/05/16 IMPRESSION: 1. Hypermetabolism in the region of the left palatini tonsil, consistent with known primary malignancy. 2. Hypermetabolic left-sided level II and III metastatic cervical lymphadenopathy. 3. 6 mm posterior left lower lobe pulmonary nodule. Attention on follow-up recommended as metastatic disease not excluded. 4. Cholelithiasis. 5. Nephrolithiasis. 6. Coronary artery and thoracoabdominal aortic atherosclerosis 06/27/16 CT neck No chief complaint on file.

## 2016-07-04 ENCOUNTER — Encounter: Payer: Self-pay | Admitting: *Deleted

## 2016-07-04 NOTE — Progress Notes (Signed)
Oncology Nurse Navigator Documentation  Met patient and his wife [facility] hospital entrance.  They had arrived to learn logistics to Radiology in preparation for his PET tomorrow morning.  I showed them Radiology Dept, explained registration process, explained scan procedure, reinforced importance of NPO status after midnight. He voiced understanding.  I showed them location of Dental Medicine in anticipation of pre-radiotherapy appt with Dr. Helyn Numbers.  He stated his teeth are in poor condition.  I explained the purpose of a dental evaluation prior to starting RT, potential for extractions prior to RT, indicated he wd be contacted to arrange an appt within a day or so of his appt with Dr. Isidore Moos.   We discussed importance of nutrition during RT/chemo, the likely recommendation for a temporary feeding tube. They understand I can be contacted prior to upcoming appts.  Gayleen Orem, RN, BSN, [place] [facility] Oncology Nurse [place] at [place] [phone]

## 2016-07-05 ENCOUNTER — Encounter (HOSPITAL_COMMUNITY)
Admission: RE | Admit: 2016-07-05 | Discharge: 2016-07-05 | Disposition: A | Discharge status: Home / Self Care | Payer: BLUE CROSS/BLUE SHIELD | Source: Ambulatory Visit | Attending: Otolaryngology | Admitting: Otolaryngology

## 2016-07-05 DIAGNOSIS — C099 Malignant neoplasm of tonsil, unspecified: Secondary | ICD-10-CM | POA: Diagnosis not present

## 2016-07-05 LAB — GLUCOSE, CAPILLARY: Glucose-Capillary: 99 mg/dL (ref 65–99)

## 2016-07-05 MED ORDER — FLUDEOXYGLUCOSE F - 18 (FDG) INJECTION
9.5500 | Freq: Once | INTRAVENOUS | Status: AC | PRN
  Administered 2016-07-05: 9.55 via INTRAVENOUS

## 2016-07-08 ENCOUNTER — Encounter (HOSPITAL_COMMUNITY): Payer: Self-pay | Admitting: Dentistry

## 2016-07-08 ENCOUNTER — Ambulatory Visit (HOSPITAL_COMMUNITY): Payer: Self-pay | Admitting: Dentistry

## 2016-07-08 VITALS — BP 147/92 | HR 79 | Temp 98.0°F

## 2016-07-08 DIAGNOSIS — M264 Malocclusion, unspecified: Secondary | ICD-10-CM

## 2016-07-08 DIAGNOSIS — C099 Malignant neoplasm of tonsil, unspecified: Secondary | ICD-10-CM

## 2016-07-08 DIAGNOSIS — K08409 Partial loss of teeth, unspecified cause, unspecified class: Secondary | ICD-10-CM

## 2016-07-08 DIAGNOSIS — Z01818 Encounter for other preprocedural examination: Secondary | ICD-10-CM

## 2016-07-08 DIAGNOSIS — K0889 Other specified disorders of teeth and supporting structures: Secondary | ICD-10-CM

## 2016-07-08 DIAGNOSIS — C090 Malignant neoplasm of tonsillar fossa: Secondary | ICD-10-CM | POA: Insufficient documentation

## 2016-07-08 DIAGNOSIS — K06010 Localized gingival recession, unspecified: Secondary | ICD-10-CM

## 2016-07-08 DIAGNOSIS — K0530 Chronic periodontitis, unspecified: Secondary | ICD-10-CM

## 2016-07-08 DIAGNOSIS — K036 Deposits [accretions] on teeth: Secondary | ICD-10-CM

## 2016-07-08 DIAGNOSIS — K029 Dental caries, unspecified: Secondary | ICD-10-CM

## 2016-07-08 DIAGNOSIS — K045 Chronic apical periodontitis: Secondary | ICD-10-CM

## 2016-07-08 DIAGNOSIS — K011 Impacted teeth: Secondary | ICD-10-CM

## 2016-07-08 HISTORY — DX: Malignant neoplasm of tonsil, unspecified: C09.9

## 2016-07-08 NOTE — Progress Notes (Signed)
DENTAL CONSULTATION  Date of Consultation:  07/08/2016 Patient Name:   Douglas Norris Date of Birth:   15-Sep-1955 Medical Record Number: [id number]  VITALS: BP (!) 147/92 (BP Location: Left Arm)   Pulse 79   Temp 98 F (36.7 C) (Oral)   CHIEF COMPLAINT: Patient referred by Dr. Lucia Gaskins for a dental consultation.  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 Patient with anticipated chemoradiation therapy. Patient is now seen as part of a medically necessary pre-chemoradiation therapy dental protocol examination.  The patient currently denies having any toothaches, swellings, or abscesses. Patient was last seen by a dentist in 2013 for the fabrication of a maxillary acrylic partial denture. This was with Dr. Burnard Bunting.  Patient was not satisfied with the acrylic partial denture and does not wear the acrylic partial denture. Patient does not seek regular dental care and only sees the dentist " when I need to."  Patient had wisdom tooth extraction of the upper right quadrant with an oral surgeon, Dr. Romie Minus.  Patient denies having any complications from that dental extraction. Patient is very adamant about not having additional dental extractions at this time. Patient denies having dental phobia.   PROBLEM LIST: Patient Active Problem List   Diagnosis Date Noted  . Squamous cell carcinoma of left tonsil ([place]) 07/08/2016    PMH: Past Medical History:  Diagnosis Date  . Heart murmur    as a child    PSH: Past Surgical History:  Procedure Laterality Date  . APPENDECTOMY  03/02/12  . LAPAROSCOPIC APPENDECTOMY  03/02/2012   Procedure: APPENDECTOMY LAPAROSCOPIC;  Surgeon: Joyice Faster. Cornett, MD;  Location: [facility] OR;  Service: General;  Laterality: N/A;    ALLERGIES: No Known Allergies  MEDICATIONS: Current Outpatient Prescriptions  Medication Sig Dispense Refill  . meloxicam (MOBIC) 15 MG tablet Take 1 tablet (15 mg total)  by mouth daily. (Patient not taking: Reported on 06/17/2016) 30 tablet 1  . predniSONE (DELTASONE) 20 MG tablet Take 3 PO QAM x3days, 2 PO QAM x3days, 1 PO QAM x3days (Patient not taking: Reported on 06/17/2016) 18 tablet 0   No current facility-administered medications for this visit.     LABS: Lab Results  Component Value Date   WBC 8.8 06/17/2016   HGB 15.4 06/17/2016   HCT 43.1 (A) 06/17/2016   MCV 87.6 06/17/2016   PLT 211 03/04/2012      Component Value Date/Time   NA 140 06/17/2016 1507   K 4.1 06/17/2016 1507   CL 98 06/17/2016 1507   CO2 24 06/17/2016 1507   GLUCOSE 86 06/17/2016 1507   GLUCOSE 130 (H) 03/04/2012 0630   BUN 19 06/17/2016 1507   CREATININE 0.88 06/17/2016 1507   CALCIUM 9.3 06/17/2016 1507   GFRNONAA 95 06/17/2016 1507   GFRAA 109 06/17/2016 1507   No results found for: INR, PROTIME No results found for: PTT  SOCIAL HISTORY: Social History   Social History  . Marital status: Married    Spouse name: N/A  . Number of children: 0  . Years of education: N/A   Occupational History  . Bus Driver    Social History Main Topics  . Smoking status: Former Smoker    Quit date: 07/04/2003  . Smokeless tobacco: Never Used  . Alcohol use No     Comment: Quit 2005  . Drug use: No  . Sexual activity: Yes     Comment: i partner in last  12 months   Other Topics Concern  . Not on file   Social History Narrative  . No narrative on file    FAMILY HISTORY: Family History  Problem Relation Age of Onset  . Cancer Mother     breast  . Cancer Father     pancreatic  . Cancer Sister     breast  . Cancer Sister     breast    REVIEW OF SYSTEMS: Reviewed With the patient as per history of present illness.  Psych: The patient denies having dental phobia.  DENTAL HISTORY: CHIEF COMPLAINT: Patient referred by Dr. Lucia Gaskins for a dental consultation.  HPI: Douglas Norris is a 61 year old male recently diagnosed with squamous cell carcinoma of the left  tonsil. Patient with anticipated chemoradiation therapy. Patient is now seen as part of a medically necessary pre-chemoradiation therapy dental protocol examination.  The patient currently denies having any toothaches, swellings, or abscesses. Patient was last seen by a dentist in 2013 for the fabrication of a maxillary acrylic partial denture. This was with Dr. Burnard Bunting.  Patient was not satisfied with the acrylic partial denture and does not wear the acrylic partial denture. Patient does not seek regular dental care and only sees the dentist " when I need to."  Patient had wisdom tooth extraction of the upper right quadrant with an oral surgeon, Dr. Romie Minus.  Patient denies having any complications from that dental extraction. Patient is very adamant about not having additional dental extractions at this time. Patient denies having dental phobia.   DENTAL EXAMINATION: GENERAL: The patient is a well-developed, well-nourished male in no acute distress.  HEAD AND NECK: There is left neck lymphadenopathy. I do not palpate any right neck lymphadenopathy. The patient denies acute TMJ symptoms. Maximum interincisal opening is measured at 43 mm.  INTRAORAL EXAM:Patient has normal saliva. I do not see any evidence of oral abscess formation. DENTITION:  The patient is missing tooth numbers 1, 2, 7, 9, 14, 15 .  Tooth numbers 16, 17, and 32 are full bony impactions.  PERIODONTAL: Patient has chronic periodontitis with plaque and calculus accumulations, gingival recession, and tooth mobility. There is incipient to moderate bone loss noted.  DENTAL CARIES/SUBOPTIMAL RESTORATIONS: There are multiple dental caries noted as per dental charting form.  ENDODONTIC: The patient currently denies acute pulpitis symptoms. Patient has periapical pathology associated with tooth #31. CROWN AND BRIDGE: No crown or bridge restorations.  PROSTHODONTIC: Patient has a maxillary acrylic partial denture replacing tooth numbers 7 and 9.   The patient does not wear the maxillary acrylic partial denture at this time. This was fabricated in 2013 by patient report. The maxillary partial denture is less than ideal but clinically acceptable.  OCCLUSION:Patient has a poor occlusal scheme secondary to multiple missing teeth and lack of replacement of all missing teeth with dental restorations.   RADIOGRAPHIC INTERPRETATION: An orthopantogram was taken and supplemented with 13 periapical radiographs and 4 bitewings. There are multiple missing teeth. There are full bony impacted tooth numbers 16, 17, and 32. There is incipient to moderate bone loss. There is periapical pathology and radiolucency associated with the apex of tooth #31. Multiple dental caries are noted. Radiographic calculus is noted. There is supra-eruption and drifting of the unopposed teeth into the edentulous areas.   ASSESSMENTS: 1. Squamous cell carcinoma of the left tonsil 2. Pre-chemoradiation therapy dental protocol 3. 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 Dental caries 5. Chronic periodontitis with bone loss 6. Gingival recession 7. Accretions  8. Tooth mobility as per dental charting form 9. Multiple missing teeth 10. Full bony impacted tooth numbers 16, 17, and 32. 11. Supra-eruption and drifting of the unopposed teeth into the edentulous areas 12. Multiple rotated teeth  13. Maxillary and mandibular interincisal attrition 14. Poor occlusal scheme and malocclusion 15. Patient is very adamant about NOT wanting dental extractions prior to radiation therapy.   PLAN/RECOMMENDATIONS: 1. I discussed the risks, benefits, and complications of various treatment options with the patient in relationship to his medical and dental conditions, anticipated radiation therapy and possible chemotherapy, and chemoradiation therapy side effects to include xerostomia, radiation caries, trismus, mucositis, taste changes, gum and jawbone changes, and risk for infection and  osteoradionecrosis.  We discussed various treatment options to include no treatment, multiple extractions with alveoloplasty,  evaluation by an oral surgeon for second opinion on extraction of tooth numbers 45, 62, 8, 29, and 32, pre-prosthetic surgery as indicated, periodontal therapy, dental restorations, root canal therapy, crown and bridge therapy, implant therapy, and replacement of missing teeth as indicated. We also discussed fabrication of fluoride trays and scatter protection devices as needed.  The patient currently wishes to proceed with the consultations with Dr. Isidore Moos and Dr. Alvy Bimler concerning the radiation therapy and chemotherapy before further discussion of dental treatment.  Patient is very adamant about not wanting dental extractions in spite of the future risk for infection and osteoradionecrosis. I again suggested oral surgical evaluation with his previous oral surgeon, Dr. Romie Minus, concerning dental extractions. He will consider this option. Patient is also considering future impressions for the fabrication of fluoride trays versus the use of PreviDent 5000 prescription strength fluoride toothpaste. Additional dental treatment discussion is pending his initial consultation with Dr. Alvy Bimler and Dr. Isidore Moos on Wednesday, 07/13/2016.  Patient is to call Dental Medicine to further discuss treatment options after his consultations on Wednesday.   2. Discussion of findings with medical team and coordination of future medical and dental care as needed.  I spent in excess of  120 minutes during the conduct of this consultation and >50% of this time involved direct face-to-face encounter for counseling and/or coordination of the patient's care.    Lenn Cal, DDS

## 2016-07-08 NOTE — Patient Instructions (Signed)

## 2016-07-13 ENCOUNTER — Encounter: Payer: Self-pay | Admitting: *Deleted

## 2016-07-13 ENCOUNTER — Ambulatory Visit
Admission: RE | Admit: 2016-07-13 | Discharge: 2016-07-13 | Disposition: A | Discharge status: Home / Self Care | Payer: BLUE CROSS/BLUE SHIELD | Source: Ambulatory Visit | Attending: Radiation Oncology | Admitting: Radiation Oncology

## 2016-07-13 ENCOUNTER — Ambulatory Visit (HOSPITAL_BASED_OUTPATIENT_CLINIC_OR_DEPARTMENT_OTHER): Payer: BLUE CROSS/BLUE SHIELD | Admitting: Hematology and Oncology

## 2016-07-13 ENCOUNTER — Encounter: Payer: Self-pay | Admitting: Radiation Oncology

## 2016-07-13 ENCOUNTER — Encounter: Payer: Self-pay | Admitting: Hematology and Oncology

## 2016-07-13 VITALS — BP 142/95 | HR 85 | Temp 98.4°F | Ht 69.0 in | Wt 197.2 lb

## 2016-07-13 VITALS — BP 130/81 | HR 91 | Temp 98.2°F | Resp 18 | Ht 69.0 in | Wt 198.0 lb

## 2016-07-13 DIAGNOSIS — C090 Malignant neoplasm of tonsillar fossa: Secondary | ICD-10-CM | POA: Insufficient documentation

## 2016-07-13 DIAGNOSIS — Z510 Encounter for antineoplastic radiation therapy: Secondary | ICD-10-CM | POA: Diagnosis present

## 2016-07-13 DIAGNOSIS — C099 Malignant neoplasm of tonsil, unspecified: Secondary | ICD-10-CM

## 2016-07-13 DIAGNOSIS — C770 Secondary and unspecified malignant neoplasm of lymph nodes of head, face and neck: Secondary | ICD-10-CM | POA: Insufficient documentation

## 2016-07-13 DIAGNOSIS — Z87891 Personal history of nicotine dependence: Secondary | ICD-10-CM | POA: Insufficient documentation

## 2016-07-13 DIAGNOSIS — Z809 Family history of malignant neoplasm, unspecified: Secondary | ICD-10-CM | POA: Insufficient documentation

## 2016-07-13 DIAGNOSIS — R509 Fever, unspecified: Secondary | ICD-10-CM | POA: Diagnosis not present

## 2016-07-13 DIAGNOSIS — Z79899 Other long term (current) drug therapy: Secondary | ICD-10-CM | POA: Diagnosis not present

## 2016-07-13 DIAGNOSIS — R634 Abnormal weight loss: Secondary | ICD-10-CM

## 2016-07-13 NOTE — Progress Notes (Signed)
[facility] CONSULT NOTE  Patient Care Team: No Pcp Per Patient as PCP - General (General Practice) Rozetta Nunnery, MD as Consulting Physician (Otolaryngology) Eppie Gibson, MD as Attending Physician (Radiation Oncology) Heath Lark, MD as Consulting Physician (Hematology and Oncology) Lenn Cal, DDS as Consulting Physician (Dentistry) Leota Sauers, RN as Oncology Nurse [place], RD as Dietitian (Nutrition) Sharen Counter, CCC-SLP as Speech Language Pathologist (Speech Pathology) Jomarie Longs, PT as Physical Therapist (Physical Therapy) Kennith Center, LCSW as Social Worker  CHIEF COMPLAINTS/PURPOSE OF CONSULTATION:  Newly diagnosed squamous cell carcinoma of the tonsil The patient is here accompanied by his wife, Cecille Rubin of 9 years.  The patient is currently not working.  He used to drive a school bus  HISTORY OF PRESENTING ILLNESS:  Douglas Norris 61 y.o. male is here because of newly diagnosed tonsil cancer.  He first presented with palpable left neck mass for the past 4-5 weeks. It is not causing any pain.  He presented to primary care doctor for evaluation and was subsequently referred to ENT.  I review his records and summarized as follows:   Squamous cell carcinoma of left tonsil ([place])   06/27/2016 Imaging    Ct neck: Multiple malignant appearing lymph nodes in the left cervical chain as described, up to 23 mm in diameter. Possible early nodal disease in the right level 2 neck. Asymmetric fullness of the left tonsillar fossa, suspect squamous cell carcinoma primary.       06/28/2016 Pathology Results    Diagnosis Tonsil, biopsy, left - INVASIVE SQUAMOUS CELL CARCINOMA. - SEE COMMENT. Microscopic Comment The carcinoma appears moderately differentiated and is non-keratinizing. A p16 stain will be performed and the results reported separately The malignant cells are strongly and diffusely positive for p16.      07/05/2016 PET  scan    1. Hypermetabolism in the region of the left palatini tonsil, consistent with known primary malignancy. 2. Hypermetabolic left-sided level II and III metastatic cervical lymphadenopathy. 3. 6 mm posterior left lower lobe pulmonary nodule. Attention on follow-up recommended as metastatic disease not excluded. 4. Cholelithiasis. 5. Nephrolithiasis. 6. Coronary artery and thoracoabdominal aortic atherosclerosis.       he denies any hearing deficit, difficulties with chewing food, swallowing difficulties, painful swallowing, changes in the quality of his voice or abnormal weight loss.  MEDICAL HISTORY:  Past Medical History:  Diagnosis Date  . Heart murmur    as a child  . Squamous cell carcinoma of left tonsil ([facility]) 07/08/2016    SURGICAL HISTORY: Past Surgical History:  Procedure Laterality Date  . APPENDECTOMY  03/02/12  . LAPAROSCOPIC APPENDECTOMY  03/02/2012   Procedure: APPENDECTOMY LAPAROSCOPIC;  Surgeon: Joyice Faster. Cornett, MD;  Location: [facility];  Service: General;  Laterality: N/A;    SOCIAL HISTORY: Social History   Social History  . Marital status: Married    Spouse name: N/A  . Number of children: 0  . Years of education: N/A   Occupational History  . unemployed    Social History Main Topics  . Smoking status: Former Smoker    Quit date: 07/04/2003  . Smokeless tobacco: Never Used  . Alcohol use No     Comment: Quit 2005  . Drug use: No  . Sexual activity: Yes     Comment: i partner in last 12 months   Other Topics Concern  . Not on file   Social History Narrative  . No narrative on  file    FAMILY HISTORY: Family History  Problem Relation Age of Onset  . Cancer Mother     breast  . Cancer Father     pancreatic  . Cancer Sister     breast  . Cancer Sister     breast    ALLERGIES:  has No Known Allergies.  MEDICATIONS:  Current Outpatient Prescriptions  Medication Sig Dispense Refill  . Melatonin 10 MG TABS Take by mouth.    .  Multiple Vitamin (MULTIVITAMIN) tablet Take 1 tablet by mouth daily.    . Omega-3 Fatty Acids (FISH OIL OMEGA-3 PO) Take by mouth.    Marland Kitchen VALERIAN ROOT PO Take by mouth.     No current facility-administered medications for this visit.     REVIEW OF SYSTEMS:   Constitutional: Denies fevers, chills or abnormal night sweats Eyes: Denies blurriness of vision, double vision or watery eyes Ears, nose, mouth, throat, and face: Denies mucositis or sore throat Respiratory: Denies cough, dyspnea or wheezes Cardiovascular: Denies palpitation, chest discomfort or lower extremity swelling Gastrointestinal:  Denies nausea, heartburn or change in bowel habits Skin: Denies abnormal skin rashes Neurological:Denies numbness, tingling or new weaknesses Behavioral/Psych: Mood is stable, no new changes  All other systems were reviewed with the patient and are negative.  PHYSICAL EXAMINATION: ECOG PERFORMANCE STATUS: 1 - Symptomatic but completely ambulatory  Vitals:   07/13/16 1339  BP: 130/81  Pulse: 91  Resp: 18  Temp: 98.2 F (36.8 C)   Filed Weights   07/13/16 1339  Weight: 198 lb (89.8 kg)    GENERAL:alert, no distress and comfortable SKIN: skin color, texture, turgor are normal, no rashes or significant lesions EYES: normal, conjunctiva are pink and non-injected, sclera clear OROPHARYNX:no exudate, no erythema and lips, buccal mucosa, and tongue normal  NECK: supple, thyroid normal size, non-tender, without nodularity LYMPH: He has palpable large neck mass on exam LUNGS: clear to auscultation and percussion with normal breathing effort HEART: regular rate & rhythm and no murmurs and no lower extremity edema ABDOMEN:abdomen soft, non-tender and normal bowel sounds Musculoskeletal:no cyanosis of digits and no clubbing  PSYCH: alert & oriented x 3 with fluent speech NEURO: no focal motor/sensory deficits  LABORATORY DATA:  I have reviewed the data as listed Lab Results  Component Value  Date   WBC 8.8 06/17/2016   HGB 15.4 06/17/2016   HCT 43.1 (A) 06/17/2016   MCV 87.6 06/17/2016   PLT 211 03/04/2012   Lab Results  Component Value Date   NA 140 06/17/2016   K 4.1 06/17/2016   CL 98 06/17/2016   CO2 24 06/17/2016    RADIOGRAPHIC STUDIES: I reviewed imaging study with the patient and his wife I have personally reviewed the radiological images as listed and agreed with the findings in the report. Dg Neck Soft Tissue  Result Date: 06/17/2016 CLINICAL DATA:  The patient reports a golf ball sized lymph node on the left side of the neck for 6 months. EXAM: NECK SOFT TISSUES - 1+ VIEW COMPARISON:  None. FINDINGS: There is no evidence of retropharyngeal soft tissue swelling or epiglottic enlargement. The cervical airway is unremarkable and no radio-opaque foreign body identified. IMPRESSION: Negative exam. Plain films are not sensitive for evaluation of lymphadenopathy or soft tissue mass. CT neck with contrast is a much better test. Electronically Signed   By: Inge Rise M.D.   On: 06/17/2016 14:51   Ct Soft Tissue Neck W Contrast  Result Date: 06/27/2016 CLINICAL DATA:  Left submandibular mass.  Symptoms for 4 months. EXAM: CT NECK WITH CONTRAST TECHNIQUE: Multidetector CT imaging of the neck was performed using the standard protocol following the bolus administration of intravenous contrast. CONTRAST:  85mL ISOVUE-300 IOPAMIDOL (ISOVUE-300) INJECTION 61% COMPARISON:  None. FINDINGS: Pharynx and larynx: Suspicious asymmetric fullness and subtly increased enhancement of the left palatine tonsil. No evidence of muscular or carotid space invasion. Salivary glands: No inflammation, mass, or stone. Thyroid: Normal. Lymph nodes: There are at least 4 enlarged lymph nodes in the left cervical chain, level 2, 3, and 3/4. Largest is at the level of the hyoid and a palpable marker and measures 23 mm. This lymph node has some neighboring fat haziness but no certain extracapsular tumor.  Left cervical adenopathy at least continues to the level of the upper cricoid, where there is a lymph node anterior to the IJ measuring 9 mm in diameter. No enlarged right cervical chain lymph nodes, but there is a right level 2 lobulated node with possible low-density component posteriorly. Vascular: The left IJ is compressed at the level of greatest cervical adenopathy. Carotid atherosclerosis. Limited intracranial: Negative Visualized orbits: Negative Mastoids and visualized paranasal sinuses: Clear Skeleton: No acute or aggressive finding Upper chest: No acute finding or visible nodules. Other: These results will be called to the ordering clinician or representative by the Radiologist Assistant, and communication documented in the PACS or zVision Dashboard. IMPRESSION: Multiple malignant appearing lymph nodes in the left cervical chain as described, up to 23 mm in diameter. Possible early nodal disease in the right level 2 neck. Asymmetric fullness of the left tonsillar fossa, suspect squamous cell carcinoma primary. Electronically Signed   By: Monte Fantasia M.D.   On: 06/27/2016 16:38   Nm Pet Image Initial (pi) Skull Base To Thigh  Result Date: 07/05/2016 CLINICAL DATA:  Initial treatment strategy for tonsillar cancer. EXAM: NUCLEAR MEDICINE PET SKULL BASE TO THIGH TECHNIQUE: 9.6 mCi F-18 FDG was injected intravenously. Full-ring PET imaging was performed from the skull base to thigh after the radiotracer. CT data was obtained and used for attenuation correction and anatomic localization. FASTING BLOOD GLUCOSE:  Value: 99 mg/dl COMPARISON:  CT neck 06/27/2016 FINDINGS: NECK Hypermetabolism in the region left palatini tonsil compatible with known primary malignancy. SUV max = 15.5. Left-sided level II/III hypermetabolic lymphadenopathy demonstrates SUV max = 20.1. Low level III lymph node measuring 8 mm short axis seen image 45 series 4 is hypermetabolic with SUV max = 6.7. CHEST No hypermetabolic  mediastinal or hilar nodes. No suspicious pulmonary nodules on the CT scan. Heart size upper normal. Insert calcium heart ascending thoracic aorta measures 3.8 cm maximum diameter. 6 mm posterior left lower lobe pulmonary nodule (image 57 series 7) shows no hypermetabolic activity on PET imaging, but is below the accepted size threshold for reliable resolution. ABDOMEN/PELVIS No abnormal hypermetabolic activity within the liver, pancreas, adrenal glands, or spleen. No hypermetabolic lymph nodes in the abdomen or pelvis. Calcified gallstones evident. Nonobstructing renal calculi noted bilaterally. Left-sided diverticulosis without diverticulitis. There is abdominal aortic atherosclerosis without aneurysm. SKELETON No focal hypermetabolic activity to suggest skeletal metastasis. IMPRESSION: 1. Hypermetabolism in the region of the left palatini tonsil, consistent with known primary malignancy. 2. Hypermetabolic left-sided level II and III metastatic cervical lymphadenopathy. 3. 6 mm posterior left lower lobe pulmonary nodule. Attention on follow-up recommended as metastatic disease not excluded. 4. Cholelithiasis. 5. Nephrolithiasis. 6. Coronary artery and thoracoabdominal aortic atherosclerosis. Electronically Signed   By: Verda Cumins.D.  On: 07/05/2016 08:41    ASSESSMENT:  Newly diagnosed squamous cell carcinoma of the Head & Neck, HPV Positive  PLAN:   Squamous cell carcinoma of left tonsil ([facility]) I discussed with the patient and family members to rationale for concurrent chemoradiation therapy. The intention of treatment is curative. I explained to him why he is not a surgical candidate In preparation for treatment, he would need to obtain dental clearance, multidisciplinary clinic to see dietitian, speech and language therapist and physical therapy along with chemotherapy education class I discussed with the patient the rationale of placing port and feeding tube up front before treatment. We  will also obtain baseline hearing test. I recommend concurrent chemoradiation therapy with high-dose cisplatin on days 1, 22 and 43. I will get the ENT navigator to coordinate appointments and I will see him back prior to start date of treatment    Orders Placed This Encounter  Procedures  . Ambulatory Referral to Speech Therapy  (specifically to Garald Balding)    Referral Priority:   Routine    Referral Type:   Speech Therapy    Referral Reason:   Specialty Services Required    Requested Specialty:   Speech Pathology    Number of Visits Requested:   1  . Amb Referral to Nutrition and Diabetic Education (specifically to Ernestene Kiel)    Referral Priority:   Routine    Referral Type:   Consultation    Referral Reason:   Specialty Services Required    Number of Visits Requested:   1  . Ambulatory Referral to Social Work    Referral Priority:   Routine    Referral Type:   Consultation    Referral Reason:   Specialty Services Required    Number of Visits Requested:   1  . Ambulatory Referral to Physical Therapy    Referral Priority:   Routine    Referral Type:   Physical Medicine    Referral Reason:   Specialty Services Required    Requested Specialty:   Physical Therapy    Number of Visits Requested:   1    All questions were answered. The patient knows to call the clinic with any problems, questions or concerns. I spent 55 minutes counseling the patient face to face. The total time spent in the appointment was 60 minutes and more than 50% was on counseling.     Heath Lark, MD 07/13/16 2:19 PM

## 2016-07-13 NOTE — Assessment & Plan Note (Signed)
I discussed with the patient and family members to rationale for concurrent chemoradiation therapy. The intention of treatment is curative. I explained to him why he is not a surgical candidate In preparation for treatment, he would need to obtain dental clearance, multidisciplinary clinic to see dietitian, speech and language therapist and physical therapy along with chemotherapy education class I discussed with the patient the rationale of placing port and feeding tube up front before treatment. We will also obtain baseline hearing test. I recommend concurrent chemoradiation therapy with high-dose cisplatin on days 1, 22 and 43. I will get the ENT navigator to coordinate appointments and I will see him back prior to start date of treatment

## 2016-07-13 NOTE — Progress Notes (Signed)
Head and Neck Cancer Location of Tumor / Histology:  06/28/16 Tonsil, biopsy, left - INVASIVE SQUAMOUS CELL CARCINOMA The malignant cells are strongly and diffusely positive for p16.  Patient presented  with symptoms of: Left mandibular mass for 4 months.   Biopsies of Left Tonsil revealed: Invasive squamous cell carcinoma.   Nutrition Status Yes No Comments  Weight changes? [x]  []  He has tried recently to gain weight in anticipation of treatment.   Swallowing concerns? []  [x]    PEG? []  [x]     Referrals Yes No Comments  Social Work? []  [x]    Dentistry? [x]  []  07/08/16 Dr. Enrique Sack. He recommends dental extractions, however pt declined pending evaluation by Dr. Isidore Moos and Dr. Alvy Bimler  Swallowing therapy? []  [x]    Nutrition? []  [x]    Med/Onc? [x]  []  Dr. Alvy Bimler 07/13/16   Safety Issues Yes No Comments  Prior radiation? []  [x]    Pacemaker/ICD? []  [x]    Possible current pregnancy? []  [x]    Is the patient on methotrexate? []  [x]     Tobacco/Marijuana/Snuff/ETOH use: He is a former smoker. No alcohol use  Past/Anticipated interventions by otolaryngology, if any: Dr. Lucia Gaskins  Past/Anticipated interventions by medical oncology, if any:  07/13/16 Dr. Alvy Bimler PLAN:   Squamous cell carcinoma of left tonsil [facility]) I discussed with the patient and family members to rationale for concurrent chemoradiation therapy. The intention of treatment is curative. I explained to him why he is not a surgical candidate In preparation for treatment, he would need to obtain dental clearance, multidisciplinary clinic to see dietitian, speech and language therapist and physical therapy along with chemotherapy education class I discussed with the patient the rationale of placing port and feeding tube up front before treatment. We will also obtain baseline hearing test. I recommend concurrent chemoradiation therapy with high-dose cisplatin on days 1, 22 and 43. I will get the ENT navigator to coordinate  appointments and I will see him back prior to start date of treatment   Current Complaints / other details:   PET 07/05/16 IMPRESSION: 1. Hypermetabolism in the region of the left palatini tonsil, consistent with known primary malignancy. 2. Hypermetabolic left-sided level II and III metastatic cervical lymphadenopathy. 3. 6 mm posterior left lower lobe pulmonary nodule. Attention on follow-up recommended as metastatic disease not excluded. 4. Cholelithiasis. 5. Nephrolithiasis. 6. Coronary artery and thoracoabdominal aortic atherosclerosis 06/27/16 CT neck  BP (!) 142/95   Pulse 85   Temp 98.4 F (36.9 C)   Ht 5\' 9"  (1.753 m)   Wt 197 lb 3.2 oz (89.4 kg)   SpO2 98% Comment: room air  BMI 29.12 kg/m    Wt Readings from Last 3 Encounters:  07/13/16 197 lb 3.2 oz (89.4 kg)  07/13/16 198 lb (89.8 kg)  06/17/16 198 lb (89.8 kg)

## 2016-07-13 NOTE — Progress Notes (Signed)
Radiation Oncology         [phone]) [phone] ________________________________  Initial outpatient Consultation  Name: Douglas Norris MRN: [id number]  Date: 07/13/2016  DOB: 1955-09-08  CC:No PCP Per Patient  Rozetta Nunnery, *   REFERRING PHYSICIAN: Rozetta Nunnery, *  DIAGNOSIS: C09.0 Cancer of the Left Tonsillar Fossa   Cancer Staging Squamous cell carcinoma of left tonsil ([place]) Staging form: Pharynx - HPV-Mediated Oropharynx, AJCC 8th Edition - Clinical: Stage I (cT2, cN1, cM0, p16: Positive) - Signed by Eppie Gibson, MD on 07/13/2016  CHIEF COMPLAINT: Here to discuss management of 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 male who presented with a palpable left neck mass ongoing for several weeks. The patient was not experiencing any pain related to the mass. He presented to his PCP on 06/17/16 for evaluation and was subsequently referred to ENT.  Subsequently, the patient saw Dr. Lucia Gaskins who performed a biopsy of the left tonsil on 06/28/16 revealing invasive squamous cell carcinoma; malignant cells are strongly and diffusely positive for p16.  Pertinent imaging thus far includes PET scan performed on 07/05/16 revealing hypermetabolism in the region of the left palatini tonsil, consistent with known primary malignancy, as well as hypermetabolic left-sided level II and III metastatic cervical lymphadenopathy. Additionally noted was a 6 mm posterior left lower lobe pulmonary nodule which could not exclude metastatic disease.     The patient was reviewed at tumor board this morning during which his PET results/images, pathology, and plan of care were discussed and reviewed. Dr. Enrique Sack stated the patient has refused extractions despite dentition in very poor shape, including at least 1 infected tooth root. However the patient has changed his mind and is open to limited extractions.  The patient presents to the  clinic today to discuss the role that radiation may play in the treatment of his disease. He is accompanied by his wife today. We are joined today by Gayleen Orem, RN, Head and Neck Nurse Navigator.  On review of systems, the patient reports trying to gain weight recently in anticipation of treatment. He denies swallowing concerns at this time. He does not have a PEG tube.   PREVIOUS RADIATION THERAPY: No  PAST MEDICAL HISTORY:  has a past medical history of Heart murmur and Squamous cell carcinoma of left tonsil ([place]) (07/08/2016).    PAST SURGICAL HISTORY: Past Surgical History:  Procedure Laterality Date  . APPENDECTOMY  03/02/12  . LAPAROSCOPIC APPENDECTOMY  03/02/2012   Procedure: APPENDECTOMY LAPAROSCOPIC;  Surgeon: Joyice Faster. Cornett, MD;  Location: [facility];  Service: General;  Laterality: N/A;    FAMILY HISTORY: family history includes Cancer in his father, mother, sister, and sister.  SOCIAL HISTORY:  reports that he quit smoking about 13 years ago. He has never used smokeless tobacco. He reports that he does not drink alcohol or use drugs. He had a 30 year 1 ppd smoking history before quitting approximately 13 years ago. The patient does not drink any alcohol.  ALLERGIES: Patient has no known allergies.  MEDICATIONS:  Current Outpatient Prescriptions  Medication Sig Dispense Refill  . Melatonin 10 MG TABS Take by mouth.    . Multiple Vitamin (MULTIVITAMIN) tablet Take 1 tablet by mouth daily.    . Omega-3 Fatty Acids (FISH OIL OMEGA-3 PO) Take by mouth.    Marland Kitchen VALERIAN ROOT PO Take by mouth.     No current facility-administered medications for this encounter.  REVIEW OF SYSTEMS:  Notable for that above.   PHYSICAL EXAM:  height is '5\' 9"'  (1.753 m) and weight is 197 lb 3.2 oz (89.4 kg). His temperature is 98.4 F (36.9 C). His blood pressure is 142/95 (abnormal) and his pulse is 85. His oxygen saturation is 98%.   General: Alert and oriented, in no acute distress. HEENT:  Mucous membranes are moist. There is an exophytic mass in the left tonsillar fossa which is ~2 cm in dimension, approximately. Neck: Neck is notable for a left level II region neck mass that is approximately 4-5 cm in greatest dimension. It is difficult to feel a separate lymph node mass separately from the dominant mass.  Heart: Regular in rate and rhythm with no murmurs. Chest: Clear to auscultation bilaterally. Abdomen: Soft, non tender. Extremities: No edema. Lymphatics: see Neck Exam Skin: No concerning lesions. Musculoskeletal: symmetric strength and muscle tone throughout. Neurologic: EOMI. No obvious focalities. Speech is fluent. Coordination is intact. Psychiatric: Judgment and insight are intact. Affect is appropriate.  ECOG = 0  LABORATORY DATA:  Lab Results  Component Value Date   WBC 8.8 06/17/2016   HGB 15.4 06/17/2016   HCT 43.1 (A) 06/17/2016   MCV 87.6 06/17/2016   PLT 211 03/04/2012   CMP     Component Value Date/Time   NA 140 06/17/2016 1507   K 4.1 06/17/2016 1507   CL 98 06/17/2016 1507   CO2 24 06/17/2016 1507   GLUCOSE 86 06/17/2016 1507   GLUCOSE 130 (H) 03/04/2012 0630   BUN 19 06/17/2016 1507   CREATININE 0.88 06/17/2016 1507   CALCIUM 9.3 06/17/2016 1507   PROT 6.7 03/03/2012 0645   ALBUMIN 2.9 (L) 03/03/2012 0645   AST 17 03/03/2012 0645   ALT 13 03/03/2012 0645   ALKPHOS 45 03/03/2012 0645   BILITOT 0.6 03/03/2012 0645   GFRNONAA 95 06/17/2016 1507   GFRAA 109 06/17/2016 1507      RADIOGRAPHY: Dg Neck Soft Tissue  Result Date: 06/17/2016 CLINICAL DATA:  The patient reports a golf ball sized lymph node on the left side of the neck for 6 months. EXAM: NECK SOFT TISSUES - 1+ VIEW COMPARISON:  None. FINDINGS: There is no evidence of retropharyngeal soft tissue swelling or epiglottic enlargement. The cervical airway is unremarkable and no radio-opaque foreign body identified. IMPRESSION: Negative exam. Plain films are not sensitive for evaluation  of lymphadenopathy or soft tissue mass. CT neck with contrast is a much better test. Electronically Signed   By: Inge Rise M.D.   On: 06/17/2016 14:51   Ct Soft Tissue Neck W Contrast  Result Date: 06/27/2016 CLINICAL DATA:  Left submandibular mass.  Symptoms for 4 months. EXAM: CT NECK WITH CONTRAST TECHNIQUE: Multidetector CT imaging of the neck was performed using the standard protocol following the bolus administration of intravenous contrast. CONTRAST:  60m ISOVUE-300 IOPAMIDOL (ISOVUE-300) INJECTION 61% COMPARISON:  None. FINDINGS: Pharynx and larynx: Suspicious asymmetric fullness and subtly increased enhancement of the left palatine tonsil. No evidence of muscular or carotid space invasion. Salivary glands: No inflammation, mass, or stone. Thyroid: Normal. Lymph nodes: There are at least 4 enlarged lymph nodes in the left cervical chain, level 2, 3, and 3/4. Largest is at the level of the hyoid and a palpable marker and measures 23 mm. This lymph node has some neighboring fat haziness but no certain extracapsular tumor. Left cervical adenopathy at least continues to the level of the upper cricoid, where there is a lymph node  anterior to the IJ measuring 9 mm in diameter. No enlarged right cervical chain lymph nodes, but there is a right level 2 lobulated node with possible low-density component posteriorly. Vascular: The left IJ is compressed at the level of greatest cervical adenopathy. Carotid atherosclerosis. Limited intracranial: Negative Visualized orbits: Negative Mastoids and visualized paranasal sinuses: Clear Skeleton: No acute or aggressive finding Upper chest: No acute finding or visible nodules. Other: These results will be called to the ordering clinician or representative by the Radiologist Assistant, and communication documented in the PACS or zVision Dashboard. IMPRESSION: Multiple malignant appearing lymph nodes in the left cervical chain as described, up to 23 mm in diameter.  Possible early nodal disease in the right level 2 neck. Asymmetric fullness of the left tonsillar fossa, suspect squamous cell carcinoma primary. Electronically Signed   By: Monte Fantasia M.D.   On: 06/27/2016 16:38   Nm Pet Image Initial (pi) Skull Base To Thigh  Result Date: 07/05/2016 CLINICAL DATA:  Initial treatment strategy for tonsillar cancer. EXAM: NUCLEAR MEDICINE PET SKULL BASE TO THIGH TECHNIQUE: 9.6 mCi F-18 FDG was injected intravenously. Full-ring PET imaging was performed from the skull base to thigh after the radiotracer. CT data was obtained and used for attenuation correction and anatomic localization. FASTING BLOOD GLUCOSE:  Value: 99 mg/dl COMPARISON:  CT neck 06/27/2016 FINDINGS: NECK Hypermetabolism in the region left palatini tonsil compatible with known primary malignancy. SUV max = 15.5. Left-sided level II/III hypermetabolic lymphadenopathy demonstrates SUV max = 20.1. Low level III lymph node measuring 8 mm short axis seen image 45 series 4 is hypermetabolic with SUV max = 6.7. CHEST No hypermetabolic mediastinal or hilar nodes. No suspicious pulmonary nodules on the CT scan. Heart size upper normal. Insert calcium heart ascending thoracic aorta measures 3.8 cm maximum diameter. 6 mm posterior left lower lobe pulmonary nodule (image 57 series 7) shows no hypermetabolic activity on PET imaging, but is below the accepted size threshold for reliable resolution. ABDOMEN/PELVIS No abnormal hypermetabolic activity within the liver, pancreas, adrenal glands, or spleen. No hypermetabolic lymph nodes in the abdomen or pelvis. Calcified gallstones evident. Nonobstructing renal calculi noted bilaterally. Left-sided diverticulosis without diverticulitis. There is abdominal aortic atherosclerosis without aneurysm. SKELETON No focal hypermetabolic activity to suggest skeletal metastasis. IMPRESSION: 1. Hypermetabolism in the region of the left palatini tonsil, consistent with known primary  malignancy. 2. Hypermetabolic left-sided level II and III metastatic cervical lymphadenopathy. 3. 6 mm posterior left lower lobe pulmonary nodule. Attention on follow-up recommended as metastatic disease not excluded. 4. Cholelithiasis. 5. Nephrolithiasis. 6. Coronary artery and thoracoabdominal aortic atherosclerosis. Electronically Signed   By: Misty Stanley M.D.   On: 07/05/2016 08:41      IMPRESSION/PLAN: This is a delightful patient with head and neck cancer. I would recommend radiotherapy for this patient. The patient is an excellent candidate for 7 weeks of radiation therapy with concurrent chemotherapy.  We discussed the potential risks, benefits, and side effects of radiotherapy. We talked in detail about acute and late effects. We discussed that some of the most bothersome acute effects may be mucositis, dysgeusia, salivary changes, skin irritation, hair loss, dehydration, weight loss and fatigue. We talked about late effects which include but are not necessarily limited to dysphagia, hypothyroidism, nerve injury, spinal cord injury, xerostomia, trismus, and neck edema. No guarantees of treatment were given. A consent form was signed and placed in the patient's medical record. The patient is enthusiastic about proceeding with treatment. I look forward to participating in  the patient's care.    Simulation (treatment planning) will take place following clearance by Dr. Enrique Sack.  We also discussed that the treatment of head and neck cancer is a multidisciplinary process to maximize treatment outcomes and quality of life. For this reason the following referrals have been or will be made:  1) Nutritionist for nutrition support during and after treatment.  2) Speech language pathology for swallowing and/or speech therapy.  3) Social work for social support.   4) Physical therapy due to risk of lymphedema in neck and deconditioning.  5) Baseline labs including TSH.  6) PEG tube for  nutritional supplementation during ChRT  The patient met with Dr. Alvy Bimler in medical oncology today. Additionally, the patient is in agreement to reconsider dental extractions with Dr. Enrique Sack. Gayleen Orem, RN will help facilitate this scheduling.   I spent 45 minutes face to face with the patient, over 50% of which was on counseling and care coordination. __________________________________________   Eppie Gibson, MD  This document serves as a record of services personally performed by Eppie Gibson, MD. It was created on her behalf by Maryla Morrow, a trained medical scribe. The creation of this record is based on the scribe's personal observations and the provider's statements to them. This document has been checked and approved by the attending provider.

## 2016-07-15 NOTE — Progress Notes (Signed)
Oncology Nurse Navigator Documentation  Met with patient during initial consults with Drs. Alvy Bimler and Isidore Moos.  He was accompanied by his wife. 1. Further introduced myself as his/their Navigator, explained my role as a member of the Care Team. 2. Provided New Patient Information packet:  Contact information for physician, this navigator, other members of the Care Team  Advance Directive information ([place] blue pamphlet with LCSW insert)  Fall Prevention Patient Safety Plan  Appointment [place] sheet  [facility] campus map with highlight of [place] 3. Provided and discussed educational handouts for PEG and PAC.   4. Provided introductory explanation of radiation treatment including SIM planning and purpose of Aquaplast head and shoulder mask, showed them example.   5. Provided a tour of SIM and [place] areas, explained treatment and arrival procedures. 6. They verbalized understanding of information provided. I encouraged them to call with questions/concerns, they verbalized understanding.  Gayleen Orem, RN, BSN, [place] Neck Oncology Nurse [place] at [place] [phone]

## 2016-07-18 ENCOUNTER — Telehealth: Payer: Self-pay | Admitting: *Deleted

## 2016-07-18 NOTE — Telephone Encounter (Signed)
Oncology Nurse Navigator Documentation  Received call from Douglas Norris indicating he has not received appt for dental extractions, PAC placement, radiation planning.  I explained that PAC placement and radiation planning hinge on date of extractions, that he should hear from Dr. Enrique Sack in the next day or so re appt.  I later sent IB to Dr. Enrique Sack.  Douglas Norris had call me 4/13 as well expressing same concerns.  Gayleen Orem, RN, BSN, [place] Neck Oncology Nurse [place] at [place] [phone]

## 2016-07-21 ENCOUNTER — Telehealth: Payer: Self-pay | Admitting: *Deleted

## 2016-07-21 NOTE — Telephone Encounter (Signed)
Oncology Nurse Navigator Documentation  Called oral surgeon Dr. Joellyn Haff office, spoke with Tammy who indicated Douglas Norris can be seen my partner Dr. Hardie Shackleton 4/24 2:20 rather than Dr. Romie Minus 5/7.  She indicated this will be a consultation with likelihood of extraction appt the next morning, 4/25, based on Douglas. [name]'s insistence he is having a single extraction as prioritized by Dr. Enrique Sack, [facility].  I called Douglas. Tiffany Kocher, provided the above appt information, he voiced understanding.  Drs. Bernadene Person and [place] informed.  Gayleen Orem, RN, BSN, [place] Neck Oncology Nurse [place] at [place] [phone]

## 2016-07-21 NOTE — Telephone Encounter (Signed)
Oncology Nurse Navigator Documentation  Returned Mr [name]'s VM:  Provided explanation of [place] scheduling, confirmed his understanding of 0800 arrival to Radiation Waiting following lobby registration.  I informed PEG placement for next Tuesday will be rescheduled pending determination of dental extractions.  He understands PAC will be included.  He expressed concern re delay in dental procedures.  He understands I will contact oral surgeon re earlier appt.  Gayleen Orem, RN, BSN, [place] Neck Oncology Nurse [place] at [place] [phone]

## 2016-07-26 ENCOUNTER — Ambulatory Visit (HOSPITAL_COMMUNITY): Payer: BLUE CROSS/BLUE SHIELD

## 2016-07-26 ENCOUNTER — Ambulatory Visit: Payer: BLUE CROSS/BLUE SHIELD | Admitting: Nutrition

## 2016-07-26 ENCOUNTER — Encounter: Payer: Self-pay | Admitting: *Deleted

## 2016-07-26 ENCOUNTER — Telehealth: Payer: Self-pay | Admitting: *Deleted

## 2016-07-26 ENCOUNTER — Ambulatory Visit: Payer: Self-pay

## 2016-07-26 ENCOUNTER — Ambulatory Visit: Payer: BLUE CROSS/BLUE SHIELD | Attending: Hematology and Oncology

## 2016-07-26 ENCOUNTER — Ambulatory Visit
Admission: RE | Admit: 2016-07-26 | Discharge: 2016-07-26 | Disposition: A | Discharge status: Home / Self Care | Payer: BLUE CROSS/BLUE SHIELD | Source: Ambulatory Visit | Attending: Radiation Oncology | Admitting: Radiation Oncology

## 2016-07-26 ENCOUNTER — Encounter: Payer: Self-pay | Admitting: Radiation Oncology

## 2016-07-26 ENCOUNTER — Inpatient Hospital Stay (HOSPITAL_COMMUNITY): Admission: RE | Admit: 2016-07-26 | Payer: Self-pay | Source: Ambulatory Visit

## 2016-07-26 ENCOUNTER — Ambulatory Visit: Payer: BLUE CROSS/BLUE SHIELD | Admitting: Physical Therapy

## 2016-07-26 VITALS — BP 136/95 | HR 80 | Temp 98.2°F | Wt 200.4 lb

## 2016-07-26 DIAGNOSIS — R29898 Other symptoms and signs involving the musculoskeletal system: Secondary | ICD-10-CM | POA: Insufficient documentation

## 2016-07-26 DIAGNOSIS — C099 Malignant neoplasm of tonsil, unspecified: Secondary | ICD-10-CM

## 2016-07-26 DIAGNOSIS — R1310 Dysphagia, unspecified: Secondary | ICD-10-CM | POA: Insufficient documentation

## 2016-07-26 DIAGNOSIS — M25512 Pain in left shoulder: Secondary | ICD-10-CM

## 2016-07-26 DIAGNOSIS — R293 Abnormal posture: Secondary | ICD-10-CM | POA: Insufficient documentation

## 2016-07-26 DIAGNOSIS — M25511 Pain in right shoulder: Secondary | ICD-10-CM | POA: Insufficient documentation

## 2016-07-26 DIAGNOSIS — C090 Malignant neoplasm of tonsillar fossa: Secondary | ICD-10-CM

## 2016-07-26 DIAGNOSIS — R634 Abnormal weight loss: Secondary | ICD-10-CM

## 2016-07-26 LAB — TSH: TSH: 2.205 m(IU)/L (ref 0.320–4.118)

## 2016-07-26 LAB — BUN AND CREATININE (CC13)
BUN: 20.4 mg/dL (ref 7.0–26.0)
Creatinine: 1.1 mg/dL (ref 0.7–1.3)
EGFR: 74 mL/min/{1.73_m2} — AB (ref >90)

## 2016-07-26 NOTE — Telephone Encounter (Signed)
Oncology Nurse Navigator Documentation  Received call from Mr. Tiffany Kocher having met with oral surgeon. He stated he is having 2 molars extracted tomorrow morning 0930 but has chosen to leave impacted wisdom teeth intact. Was told to expect 5-6 days for healing.  Drs. Herbie Drape and [place] informed.  Gayleen Orem, RN, BSN, [place] Neck Oncology Nurse [place] at [place] [phone]

## 2016-07-26 NOTE — Progress Notes (Signed)
Oncology Nurse Navigator Documentation  Met with Mr. Douglas Norris during [facility] [place].  He was accompanied by his wife.  Provided verbal and written overview of [place], the clinicians who will be seeing him, encouraged him to ask questions during his time with them.  He was seen by Nutrition, SLP, PT, SW and [place].  Spoke with him at end of [facility], addressed questions. He understands I can be contacted with needs/concerns.  Gayleen Orem, RN, BSN, [place] at [place] [phone]

## 2016-07-26 NOTE — Telephone Encounter (Signed)
Rick, please call IR to reschedule PEG and port next week

## 2016-07-26 NOTE — Therapy (Signed)
[place], [place], [address] Phone: [phone]   Fax:  [phone]  Physical Therapy Evaluation  Patient Details  Name: Douglas Norris MRN: [id number] Date of Birth: April 12, 1955 Referring Provider: Dr. Heath Lark  Encounter Date: 07/26/2016      PT End of Session - 07/26/16 1416    Visit Number 1   Number of Visits 1   PT Start Time 0833   PT Stop Time 0903   PT Time Calculation (min) 30 min   Activity Tolerance Patient tolerated treatment well   Behavior During Therapy [facility] for tasks assessed/performed      Past Medical History:  Diagnosis Date  . Heart murmur    as a child  . Squamous cell carcinoma of left tonsil ([place]) 07/08/2016    Past Surgical History:  Procedure Laterality Date  . APPENDECTOMY  03/02/12  . LAPAROSCOPIC APPENDECTOMY  03/02/2012   Procedure: APPENDECTOMY LAPAROSCOPIC;  Surgeon: Joyice Faster. Cornett, MD;  Location: [facility];  Service: General;  Laterality: N/A;    There were no vitals filed for this visit.       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   Patient is accompained by: Family member  wife   Pertinent History p/w palpable left neck mass, ongoing for several weeks.  Diagnosed with Lt. tonsil invasive squamous cell carcinoma, p16 positive, with Lt. neck adenopathy; 6 mm left lower lobe lung nodule, not identified.  Plan is for concurrent chemoradiation starting 08/10/16.  Ex-smoker, ex-drinker.     Patient Stated Goals get info from all lung clinic providers   Currently in Pain? Yes   Pain Score 5    Pain Location Arm  biceps + deltoids, per pt.   Pain Orientation Right;Left;Upper   Pain Descriptors / Indicators Sharp   Aggravating Factors  reaching   Pain Relieving Factors resting arms            [facility] PT Assessment - 07/26/16 1404      Assessment   Medical Diagnosis invasive squamous cell carcinoma of Lt. tonsil with lymphadenopathy   Referring Provider Dr. Heath Lark   Onset Date/Surgical Date 06/17/16   Prior Therapy last fall for frozen shoulders     Precautions   Precaution Comments active cancer     Restrictions   Weight Bearing Restrictions No     Balance Screen   Has the patient fallen in the past 6 months No   Has the patient had a decrease in activity level because of a fear of falling?  No   Is the patient reluctant to leave their home because of a fear of falling?  No     Home Ecologist residence   Living Arrangements Spouse/significant other   Type of [place] One level     Prior Function   Level of Independence Independent   Leisure walks 1-1.5 miles most days and reports being active; also reports he does shoulder exercises 2x/day from his therapy for frozen shoulders last fall     Cognition   Overall Cognitive Status Within Functional Limits for tasks assessed     Observation/Other Assessments   Observations visible mass left neck     Functional Tests   Functional tests Sit to Stand     Sit to Stand   Comments 9 times in  30 seconds, below average for age     Posture/Postural Control   Posture/Postural Control Postural limitations   Postural Limitations Forward head;Rounded Shoulders  significant forward head     ROM / Strength   AROM / PROM / Strength AROM     AROM   Overall AROM Comments neck AROM WFL except left sidebend 25% limited; bilat. shoulder flexion and abduction appear very slightly limited, rotations limited about 20% bilat..     Palpation   Palpation comment firm mass palpated left neck     Ambulation/Gait   Ambulation/Gait Yes   Ambulation/Gait Assistance 7: Independent           LYMPHEDEMA/ONCOLOGY QUESTIONNAIRE - 07/26/16 1411      Type   Cancer Type left tonsil squamous cell     Treatment   Active  Radiation Treatment --  to begin 08/10/16     Lymphedema Assessments   Lymphedema Assessments Head and Neck     Head and Neck   4 cm superior to sternal notch around neck 40.9 cm   6 cm superior to sternal notch around neck 41.5 cm   8 cm superior to sternal notch around neck 42 cm   Other 43.8 cm. at 10 cm. superior to sternal notch, and including left neck mass                        PT Education - 07/26/16 1414    Education provided Yes   Education Details neck ROM, posture, breathing, walking, Cure article on staying active, lymphedema and PT info   Person(s) Educated Patient;Spouse   Methods Explanation;Handout   Comprehension Verbalized understanding                 Head and Neck Clinic Goals - 07/26/16 1423      Patient will be able to verbalize understanding of a home exercise program for cervical range of motion, posture, and walking.    Status Achieved     Patient will be able to verbalize understanding of proper sitting and standing posture.    Status Achieved     Patient will be able to verbalize understanding of lymphedema risk and availability of treatment for this condition.    Status Achieved           Plan - 07/26/16 1416    Clinical Impression Statement This is a gentleman who uses somewhat sarcastic humor during his evaluation today.  He has Lt. tonsil cancer and will undergo chemoradiation beginning 08/10/16.  He has forward head posture, slight AROM limitations in neck and shoulders, decreased 30 second sit to stand for his age, and painful shoulder areas.  He has a visible and palpable mass at left neck. Eval is moderate today with comorbidities of frozen shoulders and evolving with cancer treatment to begin in a couple of weeks.   Rehab Potential Good   PT Frequency One time visit   PT Treatment/Interventions Patient/family education   PT Next Visit Plan None at this time; patient may need therapy going forward  should lymphedema develop.   PT Home Exercise Plan neck ROM, posture, walking   Consulted and Agree with Plan of Care Patient      Patient will benefit from skilled therapeutic intervention in order to improve the following deficits and impairments:  Postural dysfunction, Decreased range of motion, Decreased activity tolerance  Visit Diagnosis: Abnormal posture - Plan: PT plan of care cert/re-cert  Other symptoms and signs  involving the musculoskeletal system - Plan: PT plan of care cert/re-cert  Right shoulder pain, unspecified chronicity - Plan: PT plan of care cert/re-cert  Left shoulder pain, unspecified chronicity - Plan: PT plan of care cert/re-cert     Problem List Patient Active Problem List   Diagnosis Date Noted  . Cancer of tonsillar fossa ([place]) 07/08/2016    [address] 07/26/2016, 2:26 PM  [place], [place], [address] Phone: [phone]   Fax:  [phone]  Name: Douglas Norris MRN: [id number] Date of Birth: Jan 14, 1956  Serafina Royals, PT 07/26/16 2:26 PM

## 2016-07-26 NOTE — Progress Notes (Signed)
Head & Neck Multidisciplinary Clinic Clinical Social Work  Clinical Social Work met with patient/family at head & neck multidisciplinary clinic to offer support and assess for psychosocial needs.  Douglas Norris was accompanied by his spouse.  The patient shared multiple times his main concern was finances and the medical costs he was incurring.  CSW validated patient's financial concerns and explored other causes of distress.  Patient's spouse was most concerned with patient's inability to eat throughout treatment.  They both shared they relied heavily on their faith/church community when dealing with difficult life events.  Clinical Social Work briefly discussed Clinical Social Work role and [employer] support programs/services.  Clinical Social Work encouraged patient to call with any additional questions or concerns.   Maryjean Morn, MSW, LCSW, OSW-C Clinical Social Worker [facility] [phone]

## 2016-07-26 NOTE — Progress Notes (Signed)
Financial Counselor--spoke with patient today--addressed his concerns regarding insurance--also talked to him about [place] [employer]--he is going to bring in his income verification once he starts treatment

## 2016-07-26 NOTE — Progress Notes (Signed)
Patient was seen in Head and Neck Clinic.  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 Patient will receive concurrent chemotherapy and radiation treatments. He is a patient of Dr. Isidore Moos and Dr. Alvy Bimler.  Past medical history includes history of heart murmur.  Medications include MVI, omega-3 fatty acids, and Valerian Root.  Labs were reviewed.  Height: 69 inches. Weight: 200.4 pounds. Usual body weight: 185 pounds. BMI: 25.59.  Patient is scheduled for PEG and Port-A-Cath. Reports he is gaining weight because he knows he will lose some during treatment. He he denies nutrition impact symptoms.  Nutrition diagnosis:  Predicted suboptimal energy intake related to tonsil cancer and associated treatments as evidenced by history or presence of a condition for which research shows an increased incidence of suboptimal energy intake.  Intervention: Patient educated to consume high-calorie, high-protein foods in small frequent meals and snacks to minimize weight loss. Reviewed high protein foods and provided a fact sheet. Encouraged patient to take nausea medication as prescribed. Encouraged increased fluids. Educated patient on the importance of bowel regimen. Provided samples of oral nutrition supplements and suggested patient begin trying supplements to find one he enjoys. Questions were answered.  Teach back method used.  Contact information provided.  Monitoring, evaluation, goals: Patient will tolerate adequate calories and protein to minimize weight loss throughout treatment.  Next visit: To be scheduled weekly.  **Disclaimer: This note was dictated with voice recognition software. Similar sounding words can inadvertently be transcribed and this note may contain transcription errors which may not have been corrected upon publication of note.**

## 2016-07-27 ENCOUNTER — Telehealth (HOSPITAL_COMMUNITY): Payer: Self-pay | Admitting: Dentistry

## 2016-07-27 ENCOUNTER — Telehealth: Payer: Self-pay | Admitting: *Deleted

## 2016-07-27 ENCOUNTER — Other Ambulatory Visit: Payer: Self-pay | Admitting: Hematology and Oncology

## 2016-07-27 DIAGNOSIS — C090 Malignant neoplasm of tonsillar fossa: Secondary | ICD-10-CM

## 2016-07-27 NOTE — Progress Notes (Signed)
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

## 2016-07-27 NOTE — Patient Instructions (Signed)
SWALLOWING EXERCISES Do these 6 of the 7 days per week until 6 months after your last day of radiation, then 2 times per week afterwards  1. Effortful Swallows - Press your tongue against the roof of your mouth for 3 seconds, then squeeze          the muscles in your neck while you swallow your saliva or a sip of water - Repeat 20 times, 2-3 times a day, and use whenever you eat or drink  2. Masako Swallow - swallow with your tongue sticking out - Stick tongue out past your teeth and gently bite tongue with your teeth - Swallow, while holding your tongue with your teeth - Repeat 20 times, 2-3 times a day *use a wet spoon if your mouth gets dry*  3. Shaker Exercise - head lift - Lie flat on your back in your bed or on a couch without pillows - Raise your head and look at your feet - KEEP YOUR SHOULDERS DOWN - HOLD FOR 45-60 SECONDS, then lower your head back down - Repeat 3 times, 2-3 times a day  4. Mendelsohn Maneuver - "half swallow" exercise - Start to swallow, and keep your Adam's apple up by squeezing hard with the            muscles of the throat - Hold the squeeze for 5-7 seconds and then relax - Repeat 20 times, 2-3 times a day *use a wet spoon if your mouth gets dry*  5. Breath Hold - Say "HUH!" loudly, then hold your breath for 3 seconds at your voice box - Repeat 20 times, 2-3 times a day  6. Chin pushback - Open your mouth  - Place your fist UNDER your chin near your neck, and push back with your fist for 5 seconds - Repeat 10 times, 2-3 times a day

## 2016-07-27 NOTE — Telephone Encounter (Signed)
Oncology Nurse Navigator Documentation  Spoke with Douglas Norris, [facility], requested patient be contacted to schedule baseline audiology test.  She indicated test can be conducted well before 5/9 chemo start date, will call me with appt, will fax report to my attention.  Gayleen Orem, RN, BSN, [place] Oncology Nurse [place] [facility] [place] [phone]

## 2016-07-27 NOTE — Therapy (Signed)
[facility] [address] [place], [place], [address] Phone: [phone]   Fax:  [phone]  Speech Language Pathology Evaluation  Patient Details  Name: Douglas Norris MRN: [id number] Date of Birth: 12-Jun-1955 Referring Provider: Eppie Gibson, MD  Encounter Date: 07/26/2016      End of Session - 07/27/16 1730    Visit Number 1   Number of Visits 7   Date for SLP Re-Evaluation 01/27/17   SLP Start Time 0922   SLP Stop Time  1000   SLP Time Calculation (min) 38 min   Activity Tolerance Patient tolerated treatment well      Past Medical History:  Diagnosis Date  . Heart murmur    as a child  . 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 APPENDECTOMY  03/02/12  . LAPAROSCOPIC APPENDECTOMY  03/02/2012   Procedure: APPENDECTOMY LAPAROSCOPIC;  Surgeon: Joyice Faster. Cornett, MD;  Location: [facility];  Service: General;  Laterality: N/A;    There were no vitals filed for this visit.      Subjective Assessment - 07/27/16 1729    Subjective "More exercises?"            SLP Evaluation [facility] - 07/27/16 0001      SLP Visit Information   SLP Received On 07/26/16   Referring Provider Eppie Gibson, MD   Medical Diagnosis lt tonsil CA     Pain Assessment   Currently in Pain? Yes   Pain Score 5    Pain Location Arm   Pain Orientation Left;Right;Upper   Pain Relieving Factors not moving arms     Prior Functional Status   Cognitive/Linguistic Baseline Within functional limits     Cognition   Overall Cognitive Status Within Functional Limits for tasks assessed     Auditory Comprehension   Overall Auditory Comprehension Appears within functional limits for tasks assessed     Verbal Expression   Overall Verbal Expression Appears within functional limits for tasks assessed     Oral Motor/Sensory Function   Overall Oral Motor/Sensory  Function Appears within functional limits for tasks assessed     Motor Speech   Overall Motor Speech Appears within functional limits for tasks assessed      Pt currently tolerates regular diet with thin liquids and denies overt s/s aspiration during meals. He has his natural dentition. POs: Pt ate [place] sandwich and drank H2O without overt s/s aspiration. Thyroid elevation appeared adequate, and swallows appeared timely. Oral residue noted as minimal/WNL. Pt's swallow deemed WNL at this time.   Because data states the risk for dysphagia during and after radiation treatment is high due to undergoing radiation tx, SLP taught pt about the possibility of reduced/limited ability for PO intake during rad tx. SLP encouraged pt to continue swallowing POs as far into rad tx as possible, even ingesting POs and/or completing HEP shortly after administration of pain meds.   SLP educated pt re: changes to swallowing musculature after rad tx, and why adherence to dysphagia HEP provided today and PO consumption was necessary to inhibit muscular disuse atrophy and to reduce muscle fibrosis following rad tx. Pt demonstrated understanding of these things to SLP.    SLP then developed a HEP for pt and pt was instructed how to perform exercises involving lingual, vocal, and pharyngeal strengthening. SLP performed each exercise and pt return demonstrated each exercise. SLP ensured pt performance was correct prior  to moving on to next exercise. Pt was instructed to complete this program 2-3 times a day, 6-7 days/week until 6 months after his last rad tx, then x2 a week after that. Pt appeared less-than-willing to complete HEP as directed, stating that he didn't have enough time to add this into his schedule.                     SLP Education - 07/27/16 1729    Education provided Yes   Education Details HEP, late effects head/neck radiaiton on swallow function   Person(s) Educated Patient;Spouse    Methods Explanation;Handout;Demonstration;Verbal cues   Comprehension Verbalized understanding;Returned demonstration;Verbal cues required;Need further instruction          SLP Short Term Goals - 07/27/16 1733      SLP SHORT TERM GOAL #1   Title pt will complete HEP with min A over two sessions   Time 2   Period --  visits   Status New     SLP SHORT TERM GOAL #2   Title pt will tell SLP why he is completing HEP    Time 1   Period --  visits   Status New     SLP SHORT TERM GOAL #3   Title pt will tell SLP 3 overt s/s aspiration PNA with modified independence   Time 2   Period --  visits   Status New          SLP Long Term Goals - 07/27/16 1734      SLP LONG TERM GOAL #1   Title pt will complete HEP with modified independence over two sessions   Time 4   Period --  visits   Status New     SLP LONG TERM GOAL #2   Title pt will tell SLP when HEP frequency can be reduced to x2-3/week   Time 4   Period --  visits   Status New     SLP LONG TERM GOAL #3   Title pt will tell SLP why he is completing HEP over four sessions   Time 6   Period --  visits   Status New     SLP LONG TERM GOAL #4   Title pt will tell SLP 3 overt s/s aspiration PNA over three sessions   Time 6   Period --  visits   Status New          Plan - 07/27/16 1732    Clinical Impression Statement Pt with oropharyngeal swallowing essentially WNL, however the probability of swallowing difficulty increases dramatically with the initiation of chemo and radiation therapy. Pt will need to be followed by SLP for regular assessment of accurate HEP completion as well as for safety with POs both during and following treatment/s.   Speech Therapy Frequency --  once approx every four weeks   Duration --  6 vistis   Treatment/Interventions Aspiration precaution training;Pharyngeal strengthening exercises;Trials of upgraded texture/liquids;Diet toleration management by SLP;Compensatory  techniques;Internal/external aids;SLP instruction and feedback;Patient/family education;Oral motor exercises  any or all may be used   Potential to Achieve Goals Good   Potential Considerations Cooperation/participation level   SLP Home Exercise Plan provided today - pt complained about not having time to complete ST HEP   Consulted and Agree with Plan of Care Patient      Patient will benefit from skilled therapeutic intervention in order to improve the following deficits and impairments:   Dysphagia, unspecified type  Problem List Patient Active Problem List   Diagnosis Date Noted  . Cancer of tonsillar fossa ([place]) 07/08/2016    [facility] ,[place], [place]  07/27/2016, 5:37 PM  [facility] [address] [place], [place], [address] Phone: [phone]   Fax:  [phone]  Name: Douglas Norris MRN: [id number] Date of Birth: 1956/02/23

## 2016-07-27 NOTE — Telephone Encounter (Signed)
I had a phone conversation with Douglas Norris with Dr. Raenette Rover office. (Primary DDS) Patient refused to schedule for periodontal cleaning and restoration appointments.  Dr. Enrique Sack

## 2016-07-28 NOTE — Progress Notes (Signed)
Has armband been applied? Yes  Does patient have an allergy to IV contrast dye?: No   Has patient ever received premedication for IV contrast dye?: N/A  Does patient take metformin?: No  If patient does take metformin when was the last dose: N/A  Date of lab work: 07/26/16 BUN: 20.4 CR: 1.1  IV site: Left Antecubital  Has IV site been added to flowsheet?  Yes

## 2016-07-28 NOTE — Telephone Encounter (Signed)
Oncology Nurse Navigator Documentation  Received call from [place], [place].  Mr. [name]'s audiology appt is scheduled 5/1 1:00.  She will fax report to my attention.  Gayleen Orem, RN, BSN, [place] [facility]ck Oncology Nurse [place] at [place] [phone]

## 2016-08-01 ENCOUNTER — Ambulatory Visit
Admission: RE | Admit: 2016-08-01 | Discharge: 2016-08-01 | Disposition: A | Discharge status: Home / Self Care | Payer: BLUE CROSS/BLUE SHIELD | Source: Ambulatory Visit | Attending: Radiation Oncology | Admitting: Radiation Oncology

## 2016-08-01 VITALS — BP 141/97 | HR 68 | Temp 97.8°F | Ht 69.0 in | Wt 203.8 lb

## 2016-08-01 DIAGNOSIS — C090 Malignant neoplasm of tonsillar fossa: Secondary | ICD-10-CM

## 2016-08-01 DIAGNOSIS — Z510 Encounter for antineoplastic radiation therapy: Secondary | ICD-10-CM | POA: Diagnosis not present

## 2016-08-01 MED ORDER — SODIUM CHLORIDE 0.9% FLUSH
10.0000 mL | Freq: Once | INTRAVENOUS | Status: AC
  Administered 2016-08-01: 10 mL via INTRAVENOUS

## 2016-08-01 NOTE — Progress Notes (Signed)
Please see the Nurse Progress Note in the MD Initial Consult Encounter for this patient. 

## 2016-08-01 NOTE — Progress Notes (Signed)
Head and Neck Cancer Simulation, IMRT treatment planning, and Special treatment procedure note   Outpatient  Diagnosis:    ICD-9-CM ICD-10-CM   1. Cancer of tonsillar fossa (HCC) 146.1 C09.0 BUN and Creatinine    The patient was taken to the CT simulator and laid in the supine position on the table. An Aquaplast head and shoulder mask was custom fitted to the patient's anatomy. High-resolution CT axial imaging was obtained of the head and neck with contrast. I verified that the quality of the imaging is good for treatment planning. 1 Medically Necessary Treatment Device was fabricated and supervised by me: Aquaplast mask.   Treatment planning note I plan to treat the patient with IMRT. I plan to treat the patient's tumor and bilateral neck nodes. I plan to treat to a total dose of 70 Gray in 35  fractions. Dose calculation was ordered from dosimetry.  IMRT planning Note  IMRT is medically necessary and an important modality to deliver adequate dose to the patient's at risk tissues while sparing the patient's normal structures, including the: esophagus, parotid tissue, mandible, brain stem, spinal cord, oral cavity, brachial plexus.  This justifies the use of IMRT in the patient's treatment.   Special Treatment Procedure Note:  The patient will be receiving chemotherapy concurrently. Chemotherapy heightens the risk of side effects. I have considered this during the patient's treatment planning process and will monitor the patient accordingly for side effects on a weekly basis. Concurrent chemotherapy increases the complexity of this patient's treatment and therefore this constitutes a special treatment procedure.  -----------------------------------  Eppie Gibson, MD

## 2016-08-02 ENCOUNTER — Other Ambulatory Visit: Payer: Self-pay | Admitting: Hematology and Oncology

## 2016-08-03 ENCOUNTER — Other Ambulatory Visit: Payer: Self-pay | Admitting: Radiology

## 2016-08-03 DIAGNOSIS — Z510 Encounter for antineoplastic radiation therapy: Secondary | ICD-10-CM | POA: Diagnosis not present

## 2016-08-04 ENCOUNTER — Encounter: Payer: Self-pay | Admitting: *Deleted

## 2016-08-04 ENCOUNTER — Encounter: Payer: Self-pay | Admitting: Radiation Oncology

## 2016-08-05 ENCOUNTER — Ambulatory Visit (HOSPITAL_COMMUNITY)
Admission: RE | Admit: 2016-08-05 | Discharge: 2016-08-05 | Disposition: A | Discharge status: Home / Self Care | Payer: BLUE CROSS/BLUE SHIELD | Source: Ambulatory Visit | Attending: Radiation Oncology | Admitting: Radiation Oncology

## 2016-08-05 ENCOUNTER — Encounter (HOSPITAL_COMMUNITY): Payer: Self-pay

## 2016-08-05 ENCOUNTER — Ambulatory Visit (HOSPITAL_COMMUNITY)
Admission: RE | Admit: 2016-08-05 | Discharge: 2016-08-05 | Disposition: A | Discharge status: Home / Self Care | Payer: BLUE CROSS/BLUE SHIELD | Source: Ambulatory Visit | Attending: Hematology and Oncology | Admitting: Hematology and Oncology

## 2016-08-05 ENCOUNTER — Telehealth: Payer: Self-pay | Admitting: Hematology and Oncology

## 2016-08-05 ENCOUNTER — Encounter: Payer: Self-pay | Admitting: *Deleted

## 2016-08-05 ENCOUNTER — Other Ambulatory Visit: Payer: Self-pay | Admitting: Hematology and Oncology

## 2016-08-05 DIAGNOSIS — Z87891 Personal history of nicotine dependence: Secondary | ICD-10-CM | POA: Insufficient documentation

## 2016-08-05 DIAGNOSIS — C090 Malignant neoplasm of tonsillar fossa: Secondary | ICD-10-CM

## 2016-08-05 DIAGNOSIS — C099 Malignant neoplasm of tonsil, unspecified: Secondary | ICD-10-CM | POA: Insufficient documentation

## 2016-08-05 HISTORY — PX: IR FLUORO GUIDE PORT INSERTION RIGHT: IMG5741

## 2016-08-05 HISTORY — PX: IR US GUIDE VASC ACCESS RIGHT: IMG2390

## 2016-08-05 HISTORY — PX: IR GASTROSTOMY TUBE MOD SED: IMG625

## 2016-08-05 LAB — CBC WITH DIFFERENTIAL/PLATELET
BASOS ABS: 0 10*3/uL (ref 0.0–0.1)
BASOS PCT: 0 %
EOS ABS: 0.2 10*3/uL (ref 0.0–0.7)
Eosinophils Relative: 3 %
HCT: 43.6 % (ref 39.0–52.0)
HEMOGLOBIN: 14.7 g/dL (ref 13.0–17.0)
Lymphocytes Relative: 14 %
Lymphs Abs: 1.2 10*3/uL (ref 0.7–4.0)
MCH: 30.1 pg (ref 26.0–34.0)
MCHC: 33.7 g/dL (ref 30.0–36.0)
MCV: 89.3 fL (ref 78.0–100.0)
Monocytes Absolute: 0.9 10*3/uL (ref 0.1–1.0)
Monocytes Relative: 11 %
NEUTROS ABS: 6.3 10*3/uL (ref 1.7–7.7)
NEUTROS PCT: 72 %
Platelets: 269 10*3/uL (ref 150–400)
RBC: 4.88 MIL/uL (ref 4.22–5.81)
RDW: 13.4 % (ref 11.5–15.5)
WBC: 8.7 10*3/uL (ref 4.0–10.5)

## 2016-08-05 LAB — BASIC METABOLIC PANEL
ANION GAP: 10 (ref 5–15)
BUN: 24 mg/dL — ABNORMAL HIGH (ref 6–20)
CALCIUM: 9.3 mg/dL (ref 8.9–10.3)
CO2: 25 mmol/L (ref 22–32)
Chloride: 105 mmol/L (ref 101–111)
Creatinine, Ser: 0.88 mg/dL (ref 0.61–1.24)
Glucose, Bld: 98 mg/dL (ref 65–99)
Potassium: 3.9 mmol/L (ref 3.5–5.1)
SODIUM: 140 mmol/L (ref 135–145)

## 2016-08-05 LAB — PROTIME-INR
INR: 0.93
PROTHROMBIN TIME: 12.5 s (ref 11.4–15.2)

## 2016-08-05 MED ORDER — HEPARIN SOD (PORK) LOCK FLUSH 100 UNIT/ML IV SOLN
INTRAVENOUS | Status: AC
  Filled 2016-08-05: qty 5

## 2016-08-05 MED ORDER — LIDOCAINE-EPINEPHRINE (PF) 2 %-1:200000 IJ SOLN
INTRAMUSCULAR | Status: AC
  Filled 2016-08-05: qty 20

## 2016-08-05 MED ORDER — MIDAZOLAM HCL 2 MG/2ML IJ SOLN
INTRAMUSCULAR | Status: AC | PRN
  Administered 2016-08-05 (×3): 1 mg via INTRAVENOUS
  Administered 2016-08-05: 0.5 mg via INTRAVENOUS

## 2016-08-05 MED ORDER — CEFAZOLIN SODIUM-DEXTROSE 2-4 GM/100ML-% IV SOLN
2.0000 g | INTRAVENOUS | Status: AC
  Administered 2016-08-05: 2 g via INTRAVENOUS

## 2016-08-05 MED ORDER — CEFAZOLIN SODIUM-DEXTROSE 2-4 GM/100ML-% IV SOLN
INTRAVENOUS | Status: AC
  Filled 2016-08-05: qty 100

## 2016-08-05 MED ORDER — IOPAMIDOL (ISOVUE-300) INJECTION 61%
INTRAVENOUS | Status: AC
  Administered 2016-08-05: 10 mL
  Filled 2016-08-05: qty 50

## 2016-08-05 MED ORDER — LIDOCAINE HCL 1 % IJ SOLN
INTRAMUSCULAR | Status: AC | PRN
  Administered 2016-08-05: 5 mL

## 2016-08-05 MED ORDER — FENTANYL CITRATE (PF) 100 MCG/2ML IJ SOLN
INTRAMUSCULAR | Status: AC | PRN
  Administered 2016-08-05: 25 ug via INTRAVENOUS

## 2016-08-05 MED ORDER — GLUCAGON HCL (RDNA) 1 MG IJ SOLR
INTRAMUSCULAR | Status: AC | PRN
  Administered 2016-08-05: 0.25 mg via INTRAVENOUS

## 2016-08-05 MED ORDER — IOPAMIDOL (ISOVUE-300) INJECTION 61%
10.0000 mL | Freq: Once | INTRAVENOUS | Status: AC | PRN
  Administered 2016-08-05: 10 mL

## 2016-08-05 MED ORDER — GLUCAGON HCL RDNA (DIAGNOSTIC) 1 MG IJ SOLR
INTRAMUSCULAR | Status: AC
  Filled 2016-08-05: qty 1

## 2016-08-05 MED ORDER — LIDOCAINE-EPINEPHRINE (PF) 2 %-1:200000 IJ SOLN
INTRAMUSCULAR | Status: AC | PRN
  Administered 2016-08-05: 10 mL

## 2016-08-05 MED ORDER — LIDOCAINE HCL 1 % IJ SOLN
INTRAMUSCULAR | Status: AC
  Filled 2016-08-05: qty 20

## 2016-08-05 MED ORDER — FENTANYL CITRATE (PF) 100 MCG/2ML IJ SOLN
INTRAMUSCULAR | Status: AC
  Filled 2016-08-05: qty 4

## 2016-08-05 MED ORDER — MIDAZOLAM HCL 2 MG/2ML IJ SOLN
INTRAMUSCULAR | Status: AC
  Filled 2016-08-05: qty 6

## 2016-08-05 MED ORDER — MIDAZOLAM HCL 2 MG/2ML IJ SOLN
INTRAMUSCULAR | Status: AC | PRN
  Administered 2016-08-05: 0.5 mg via INTRAVENOUS
  Administered 2016-08-05: 1 mg via INTRAVENOUS

## 2016-08-05 MED ORDER — FENTANYL CITRATE (PF) 100 MCG/2ML IJ SOLN
INTRAMUSCULAR | Status: AC | PRN
  Administered 2016-08-05: 25 ug via INTRAVENOUS
  Administered 2016-08-05: 50 ug via INTRAVENOUS

## 2016-08-05 MED ORDER — HEPARIN SOD (PORK) LOCK FLUSH 100 UNIT/ML IV SOLN
INTRAVENOUS | Status: AC | PRN
  Administered 2016-08-05: 500 [IU]

## 2016-08-05 MED ORDER — SODIUM CHLORIDE 0.9 % IV SOLN
INTRAVENOUS | Status: DC
  Administered 2016-08-05: 12:00:00 via INTRAVENOUS

## 2016-08-05 NOTE — Discharge Instructions (Signed)
Moderate Conscious Sedation, Adult, Care After These instructions provide you with information about caring for yourself after your procedure. Your health care provider may also give you more specific instructions. Your treatment has been planned according to current medical practices, but problems sometimes occur. Call your health care provider if you have any problems or questions after your procedure. What can I expect after the procedure? After your procedure, it is common:  To feel sleepy for several hours.  To feel clumsy and have poor balance for several hours.  To have poor judgment for several hours.  To vomit if you eat too soon. Follow these instructions at home: For at least 24 hours after the procedure:    Do not:  Participate in activities where you could fall or become injured.  Drive.  Use heavy machinery.  Drink alcohol.  Take sleeping pills or medicines that cause drowsiness.  Make important decisions or sign legal documents.  Take care of children on your own.  Rest. Eating and drinking   Follow the diet recommended by your health care provider.  If you vomit:  Drink water, juice, or soup when you can drink without vomiting.  Make sure you have little or no nausea before eating solid foods. General instructions   Have a responsible adult stay with you until you are awake and alert.  Take over-the-counter and prescription medicines only as told by your health care provider.  If you smoke, do not smoke without supervision.  Keep all follow-up visits as told by your health care provider. This is important. Contact a health care provider if:  You keep feeling nauseous or you keep vomiting.  You feel light-headed.  You develop a rash.  You have a fever. Get help right away if:  You have trouble breathing. This information is not intended to replace advice given to you by your health care provider. Make sure you discuss any questions you  have with your health care provider. Document Released: 01/09/2013 Document Revised: 08/24/2015 Document Reviewed: 07/11/2015 Elsevier Interactive Patient Education  2017 [place]. Gastrostomy Tube Home Guide, Adult A gastrostomy tube is a tube that is surgically placed into the stomach. It is also called a G-tube. G-tubes are used when a person is unable to eat and drink enough on their own to stay healthy. The tube is inserted into the stomach through a small cut (incision) in the skin. This tube is used for:  Feeding.  Giving medication. Gastrostomy tube care  Wash your hands with soap and water.  Remove the old dressing (if any). Some styles of G-tubes may need a dressing inserted between the skin and the G-tube. Other types of G-tubes do not require a dressing. Ask your health care provider if a dressing is needed.  Check the area where the tube enters the skin (insertion site) for redness, swelling, or pus-like (purulent) drainage. A small amount of clear or tan liquid drainage is normal. Check to make sure scar tissue (skin) is not growing around the insertion site. This could have a raised, bumpy appearance.  A cotton swab can be used to clean the skin around the tube:  When the G-tube is first put in, a normal saline solution or water can be used to clean the skin.  Mild soap and warm water can be used when the skin around the G-tube site has healed.  Roll the cotton swab around the G-tube insertion site to remove any drainage or crusting at the insertion site. Stomach  residuals Feeding tube residuals are the amount of liquids that are in the stomach at any given time. Residuals may be checked before giving feedings, medications, or as instructed by your health care provider.  Ask your health care provider if there are instances when you would not start tube feedings depending on the amount or type of contents withdrawn from the stomach.  Check residuals by attaching a  syringe to the G-tube and pulling back on the syringe plunger. Note the amount, and return the residual back into the stomach. Flushing the G-tube  The G-tube should be periodically flushed with clean warm water to keep it from clogging.  Flush the G-tube after feedings or medications. Draw up 30 mL of warm water in a syringe. Connect the syringe to the G-tube and slowly push the water into the tube.  Do not push feedings, medications, or flushes rapidly. Flush the G-tube gently and slowly.  Only use syringes made for G-tubes to flush medications or feedings.  Your health care provider may want the G-tube flushed more often or with more water. If this is the case, follow your health care provider's instructions. Feedings Your health care provider will determine whether feedings are given as a bolus (a certain amount given at one time and at scheduled times) or whether feedings will be given continuously on a feeding pump.  Formulas should be given at room temperature.  If feedings are continuous, no more than 4 hours worth of feedings should be placed in the feeding bag. This helps prevent spoilage or accidental excess infusion.  Cover and place unused formula in the refrigerator.  If feedings are continuous, stop the feedings when medications or flushes are given. Be sure to restart the feedings.  Feeding bags and syringes should be replaced as instructed by your health care provider. Giving medication  In general, it is best if all medications are in a liquid form for G-tube administration. Liquid medications are less likely to clog the G-tube.  Mix the liquid medication with 30 mL (or amount recommended by your health care provider) of warm water.  Draw up the medication into the syringe.  Attach the syringe to the G-tube and slowly push the mixture into the G-tube.  After giving the medication, draw up 30 mL of warm water in the syringe and slowly flush the G-tube.  For pills  or capsules, check with your health care provider first before crushing medications. Some pills are not effective if they are crushed. Some capsules are sustained-release medications.  If appropriate, crush the pill or capsule and mix with 30 mL of warm water. Using the syringe, slowly push the medication through the tube, then flush the tube with another 30 mL of tap water. G-tube problems G-tube was pulled out.  Cause: May have been pulled out accidentally.  Solutions: Cover the opening with clean dressing and tape. Call your health care provider right away. The G-tube should be put in as soon as possible (within 4 hours) so the G-tube opening (tract) does not close. The G-tube needs to be put in at a health care setting. An X-ray needs to be done to confirm placement before the G-tube can be used again. Redness, irritation, soreness, or foul odor around the gastrostomy site.  Cause: May be caused by leakage or infection.  Solutions: Call your health care provider right away. Large amount of leakage of fluid or mucus-like liquid present (a large amount means it soaks clothing).  Cause: Many reasons could  cause the G-tube to Kindel [place].  Solutions: Call your health care provider to discuss the amount of leakage. Skin or scar tissue appears to be growing where tube enters skin.  Cause: Tissue growth may develop around the insertion site if the G-tube is moved or pulled on excessively.  Solutions: Secure tube with tape so that excess movement does not occur. Call your health care provider. G-tube is clogged.  Cause: Thick formula or medication.  Solutions: Try to slowly push warm water into the tube with a large syringe. Never try to push any object into the tube to unclog it. Do not force fluid into the G-tube. If you are unable to unclog the tube, call your health care provider right away. Tips  Head of bed (HOB) position refers to the upright position of a person's upper body.  When  giving medications or a feeding bolus, keep the [facility] up as told by your health care provider. Do this during the feeding and for 1 hour after the feeding or medication administration.  If continuous feedings are being given, it is best to keep the [facility] up as told by your health care provider. When ADLs (activities of daily living) are performed and the [facility] needs to be flat, be sure to turn the feeding pump off. Restart the feeding pump when the [facility] is returned to the recommended height.  Do not pull or put tension on the tube.  To prevent fluid backflow, kink the G-tube before removing the cap or disconnecting a syringe.  Check the G-tube length every day. Measure from the insertion site to the end of the G-tube. If the length is longer than previous measurements, the tube may be coming out. Call your health care provider if you notice increasing G-tube length.  Oral care, such as brushing teeth, must be continued.  You may need to remove excess air (vent) from the G-tube. Your health care provider will tell you if this is needed.  Always call your health care provider if you have questions or problems with the G-tube. Get help right away if:  You have severe abdominal pain, tenderness, or abdominal bloating (distension).  You have nausea or vomiting.  You are constipated or have problems moving your bowels.  The G-tube insertion site is red, swollen, has a foul smell, or has yellow or brown drainage.  You have difficulty breathing or shortness of breath.  You have a fever.  You have a large amount of feeding tube residuals.  The G-tube is clogged and cannot be flushed. This information is not intended to replace advice given to you by your health care provider. Make sure you discuss any questions you have with your health care provider. Document Released: 05/30/2001 Document Revised: 08/27/2015 Document Reviewed: 11/26/2012 Elsevier Interactive Patient Education  2017 [place] Insertion, Care After This sheet gives you information about how to care for yourself after your procedure. Your health care provider may also give you more specific instructions. If you have problems or questions, contact your health care provider. What can I expect after the procedure? After your procedure, it is common to have:  Discomfort at the port insertion site.  Bruising on the skin over the port. This should improve over 3-4 days. Follow these instructions at home: [facility] care   After your port is placed, you will get a manufacturer's information card. The card has information about your port. Keep this card with you at all times.  Take care of  the port as told by your health care provider. Ask your health care provider if you or a family member can get training for taking care of the port at home. A home health care nurse may also take care of the port.  Make sure to remember what type of port you have. Incision care   Follow instructions from your health care provider about how to take care of your port insertion site. Make sure you:  Wash your hands with soap and water before you change your bandage (dressing). If soap and water are not available, use hand sanitizer.  Change your dressing as told by your health care provider.  Leave stitches (sutures), skin glue, or adhesive strips in place. These skin closures may need to stay in place for 2 weeks or longer. If adhesive strip edges start to loosen and curl up, you may trim the loose edges. Do not remove adhesive strips completely unless your health care provider tells you to do that.  Check your port insertion site every day for signs of infection. Check for:  More redness, swelling, or pain.  More fluid or blood.  Warmth.  Pus or a bad smell. General instructions   Do not take baths, swim, or use a hot tub until your health care provider approves.  Do not lift anything that is heavier than 10  lb (4.5 kg) for a week, or as told by your health care provider.  Ask your health care provider when it is okay to:  Return to work or school.  Resume usual physical activities or sports.  Do not drive for 24 hours if you were given a medicine to help you relax (sedative).  Take over-the-counter and prescription medicines only as told by your health care provider.  Wear a medical alert bracelet in case of an emergency. This will tell any health care providers that you have a port.  Keep all follow-up visits as told by your health care provider. This is important. Contact a health care provider if:  You cannot flush your port with saline as directed, or you cannot draw blood from the port.  You have a fever or chills.  You have more redness, swelling, or pain around your port insertion site.  You have more fluid or blood coming from your port insertion site.  Your port insertion site feels warm to the touch.  You have pus or a bad smell coming from the port insertion site. Get help right away if:  You have chest pain or shortness of breath.  You have bleeding from your port that you cannot control. Summary  Take care of the port as told by your health care provider.  Change your dressing as told by your health care provider.  Keep all follow-up visits as told by your health care provider. This information is not intended to replace advice given to you by your health care provider. Make sure you discuss any questions you have with your health care provider. Document Released: 01/09/2013 Document Revised: 02/10/2016 Document Reviewed: 02/10/2016 Elsevier Interactive Patient Education  2017 [employer].

## 2016-08-05 NOTE — Telephone Encounter (Signed)
Added chemo appointments per 5/3 schedule message. Ok per infusion supervisor to add 1st chemo 5/11. Message to NG re when fluids will be scheduled since chemo will be on Fridays.  Spoke with patient he will get new schedule 5/8.

## 2016-08-05 NOTE — Telephone Encounter (Signed)
Per response from NG - added ivf's every Saturday after tx.

## 2016-08-05 NOTE — Sedation Documentation (Signed)
Sedation end for port a cath placement.

## 2016-08-05 NOTE — Discharge Instructions (Addendum)
Moderate Conscious Sedation, Adult, Care After These instructions provide you with information about caring for yourself after your procedure. Your health care provider may also give you more specific instructions. Your treatment has been planned according to current medical practices, but problems sometimes occur. Call your health care provider if you have any problems or questions after your procedure. What can I expect after the procedure? After your procedure, it is common:  To feel sleepy for several hours.  To feel clumsy and have poor balance for several hours.  To have poor judgment for several hours.  To vomit if you eat too soon. Follow these instructions at home: For at least 24 hours after the procedure:    Do not:  Participate in activities where you could fall or become injured.  Drive.  Use heavy machinery.  Drink alcohol.  Take sleeping pills or medicines that cause drowsiness.  Make important decisions or sign legal documents.  Take care of children on your own.  Rest. Eating and drinking   Follow the diet recommended by your health care provider.  If you vomit:  Drink water, juice, or soup when you can drink without vomiting.  Make sure you have little or no nausea before eating solid foods. General instructions   Have a responsible adult stay with you until you are awake and alert.  Take over-the-counter and prescription medicines only as told by your health care provider.  If you smoke, do not smoke without supervision.  Keep all follow-up visits as told by your health care provider. This is important. Contact a health care provider if:  You keep feeling nauseous or you keep vomiting.  You feel light-headed.  You develop a rash.  You have a fever. Get help right away if:  You have trouble breathing. This information is not intended to replace advice given to you by your health care provider. Make sure you discuss any questions you  have with your health care provider. Document Released: 01/09/2013 Document Revised: 08/24/2015 Document Reviewed: 07/11/2015 Elsevier Interactive Patient Education  2017 [place] Insertion, Care After This sheet gives you information about how to care for yourself after your procedure. Your health care provider may also give you more specific instructions. If you have problems or questions, contact your health care provider. What can I expect after the procedure? After your procedure, it is common to have:  Discomfort at the port insertion site.  Bruising on the skin over the port. This should improve over 3-4 days. Follow these instructions at home: [facility] care   After your port is placed, you will get a manufacturer's information card. The card has information about your port. Keep this card with you at all times.  Take care of the port as told by your health care provider. Ask your health care provider if you or a family member can get training for taking care of the port at home. A home health care nurse may also take care of the port.  Make sure to remember what type of port you have. Incision care   Follow instructions from your health care provider about how to take care of your port insertion site. Make sure you:  Wash your hands with soap and water before you change your bandage (dressing). If soap and water are not available, use hand sanitizer.  Change your dressing as told by your health care provider.  Remove dressing tomorrow 07/07/16  Leave skin glue in place. This skin  closure may need to stay in place for 2 weeks or longer. Do not remove adhesive strips completely unless your health care provider tells you to do that.  Check your port insertion site every day for signs of infection. Check for:  More redness, swelling, or pain.  More fluid or blood.  Warmth.  Pus or a bad smell. General instructions   Do not take baths, swim, or use a hot  tub until your health care provider approves.    May shower tomorrow 07/07/16.   Do not lift anything that is heavier than 10 lb (4.5 kg) for a week, or as told by your health care provider.  Ask your health care provider when it is okay to:  Return to work or school.  Resume usual physical activities or sports.  Do not drive for 24 hours if you were given a medicine to help you relax (sedative).  Take over-the-counter and prescription medicines only as told by your health care provider.  Wear a medical alert bracelet in case of an emergency. This will tell any health care providers that you have a port.  Keep all follow-up visits as told by your health care provider. This is important. Contact a health care provider if:  You cannot flush your port with saline as directed, or you cannot draw blood from the port.  You have a fever or chills.  You have more redness, swelling, or pain around your port insertion site.  You have more fluid or blood coming from your port insertion site.  Your port insertion site feels warm to the touch.  You have pus or a bad smell coming from the port insertion site. Get help right away if:  You have chest pain or shortness of breath.  You have bleeding from your port that you cannot control. Summary  Take care of the port as told by your health care provider.  Change your dressing as told by your health care provider.  Keep all follow-up visits as told by your health care provider. This information is not intended to replace advice given to you by your health care provider. Make sure you discuss any questions you have with your health care provider. Document Released: 01/09/2013 Document Revised: 02/10/2016 Document Reviewed: 02/10/2016 Elsevier Interactive Patient Education  2017 [employer].   Gastrostomy Tube Home Guide, Adult A gastrostomy tube is a tube that is surgically placed into the stomach. It is also called a G-tube.  G-tubes are used when a person is unable to eat and drink enough on their own to stay healthy. The tube is inserted into the stomach through a small cut (incision) in the skin. This tube is used for:  Feeding.  Giving medication. Gastrostomy tube care  Wash your hands with soap and water.  Remove the old dressing (if any). Some styles of G-tubes may need a dressing inserted between the skin and the G-tube. Other types of G-tubes do not require a dressing. Ask your health care provider if a dressing is needed.  Check the area where the tube enters the skin (insertion site) for redness, swelling, or pus-like (purulent) drainage. A small amount of clear or tan liquid drainage is normal. Check to make sure scar tissue (skin) is not growing around the insertion site. This could have a raised, bumpy appearance.  A cotton swab can be used to clean the skin around the tube:  When the G-tube is first put in, a normal saline solution or water can  be used to clean the skin.  Mild soap and warm water can be used when the skin around the G-tube site has healed.  Roll the cotton swab around the G-tube insertion site to remove any drainage or crusting at the insertion site. Stomach residuals Feeding tube residuals are the amount of liquids that are in the stomach at any given time. Residuals may be checked before giving feedings, medications, or as instructed by your health care provider.  Ask your health care provider if there are instances when you would not start tube feedings depending on the amount or type of contents withdrawn from the stomach.  Check residuals by attaching a syringe to the G-tube and pulling back on the syringe plunger. Note the amount, and return the residual back into the stomach. Flushing the G-tube  The G-tube should be periodically flushed with clean warm water to keep it from clogging.  Flush the G-tube after feedings or medications. Draw up 30 mL of warm water in a  syringe. Connect the syringe to the G-tube and slowly push the water into the tube.  Do not push feedings, medications, or flushes rapidly. Flush the G-tube gently and slowly.  Only use syringes made for G-tubes to flush medications or feedings.  Your health care provider may want the G-tube flushed more often or with more water. If this is the case, follow your health care provider's instructions. Feedings Your health care provider will determine whether feedings are given as a bolus (a certain amount given at one time and at scheduled times) or whether feedings will be given continuously on a feeding pump.  Formulas should be given at room temperature.  If feedings are continuous, no more than 4 hours worth of feedings should be placed in the feeding bag. This helps prevent spoilage or accidental excess infusion.  Cover and place unused formula in the refrigerator.  If feedings are continuous, stop the feedings when medications or flushes are given. Be sure to restart the feedings.  Feeding bags and syringes should be replaced as instructed by your health care provider. Giving medication  In general, it is best if all medications are in a liquid form for G-tube administration. Liquid medications are less likely to clog the G-tube.  Mix the liquid medication with 30 mL (or amount recommended by your health care provider) of warm water.  Draw up the medication into the syringe.  Attach the syringe to the G-tube and slowly push the mixture into the G-tube.  After giving the medication, draw up 30 mL of warm water in the syringe and slowly flush the G-tube.  For pills or capsules, check with your health care provider first before crushing medications. Some pills are not effective if they are crushed. Some capsules are sustained-release medications.  If appropriate, crush the pill or capsule and mix with 30 mL of warm water. Using the syringe, slowly push the medication through the  tube, then flush the tube with another 30 mL of tap water. G-tube problems G-tube was pulled out.  Cause: May have been pulled out accidentally.  Solutions: Cover the opening with clean dressing and tape. Call your health care provider right away. The G-tube should be put in as soon as possible (within 4 hours) so the G-tube opening (tract) does not close. The G-tube needs to be put in at a health care setting. An X-ray needs to be done to confirm placement before the G-tube can be used again. Redness, irritation, soreness, or foul  odor around the gastrostomy site.  Cause: May be caused by leakage or infection.  Solutions: Call your health care provider right away. Large amount of leakage of fluid or mucus-like liquid present (a large amount means it soaks clothing).  Cause: Many reasons could cause the G-tube to leak.  Solutions: Call your health care provider to discuss the amount of leakage. Skin or scar tissue appears to be growing where tube enters skin.  Cause: Tissue growth may develop around the insertion site if the G-tube is moved or pulled on excessively.  Solutions: Secure tube with tape so that excess movement does not occur. Call your health care provider. G-tube is clogged.  Cause: Thick formula or medication.  Solutions: Try to slowly push warm water into the tube with a large syringe. Never try to push any object into the tube to unclog it. Do not force fluid into the G-tube. If you are unable to unclog the tube, call your health care provider right away. Tips  Head of bed (HOB) position refers to the upright position of a person's upper body.  When giving medications or a feeding bolus, keep the [facility] up as told by your health care provider. Do this during the feeding and for 1 hour after the feeding or medication administration.  If continuous feedings are being given, it is best to keep the [facility] up as told by your health care provider. When ADLs (activities of daily  living) are performed and the [facility] needs to be flat, be sure to turn the feeding pump off. Restart the feeding pump when the [facility] is returned to the recommended height.  Do not pull or put tension on the tube.  To prevent fluid backflow, kink the G-tube before removing the cap or disconnecting a syringe.  Check the G-tube length every day. Measure from the insertion site to the end of the G-tube. If the length is longer than previous measurements, the tube may be coming out. Call your health care provider if you notice increasing G-tube length.  Oral care, such as brushing teeth, must be continued.  You may need to remove excess air (vent) from the G-tube. Your health care provider will tell you if this is needed.  Always call your health care provider if you have questions or problems with the G-tube. Get help right away if:  You have severe abdominal pain, tenderness, or abdominal bloating (distension).  You have nausea or vomiting.  You are constipated or have problems moving your bowels.  The G-tube insertion site is red, swollen, has a foul smell, or has yellow or brown drainage.  You have difficulty breathing or shortness of breath.  You have a fever.  You have a large amount of feeding tube residuals.  The G-tube is clogged and cannot be flushed. This information is not intended to replace advice given to you by your health care provider. Make sure you discuss any questions you have with your health care provider. Document Released: 05/30/2001 Document Revised: 08/27/2015 Document Reviewed: 11/26/2012 Elsevier Interactive Patient Education  2017 [place] of a Feeding Tube Feeding tubes are often given to those who have trouble swallowing or cannot take food or medicine. A feeding tube can:  Go into the nose and down to the stomach.  Go through the skin in the belly (abdomen) and into the stomach or small bowel. Supplies needed to care for the tube  site:  Clean gloves.  Clean wash cloth, gauze pads,  or soft paper towel.  Cotton swabs.  Skin barrier ointment or cream.  Soap and water.  Precut foam pads or gauze (that go around the tube).  Tube tape. Tube site care 1. Have all supplies ready. 2. Wash hands well. 3. Put on clean gloves. 4. Remove dirty foam pads or gauze near the tube site, if present. 5. Check the skin around the tube site for redness, rash, puffiness (swelling), leaking fluid, or extra tissue growth. Call your doctor if you see any of these. 6. Wet the gauze and cotton swabs with water and soap. 7. Wipe the area closest to the tube with cotton swabs. Wipe the surrounding skin with moistened gauze. Rinse with water. 8. Dry the skin and tube site with a dry gauze pad or soft paper towel. Do not use antibiotic ointments at the tube site. 9. If the skin is red, apply petroleum jelly in a circular motion, using a cotton swab. Your doctor may suggest a different cream or ointment. Use what the doctor suggests. 10. Apply a new pre-cut foam pad or gauze around the tube. Tape the edges down. Foam pads or gauze may be left off if there is no fluid at the tube site. 11. Use tape or a device that will attach your feeding tube to your skin or do as directed. Rotate where you tape the tube. 12. Sit the person up. 13. Throw away used supplies. 14. Remove gloves. 15. Wash hands. Supplies needed to flush a feeding tube:  Clean gloves.  60 mL syringe (that connects to feeding tube).  Towel.  Water. Flushing a feeding tube 1. Have all supplies ready. 2. Wash hands well. 3. Put on clean gloves. 4. Pull 30 mL of water into the syringe. 5. Bend (kink) the feeding tube while disconnecting it from the feeding-bag tubing or while removing the plug at the end of the tube. 6. Insert the tip of the syringe into the end of the feeding tube. Stop bending the tube. Slowly inject the water. 7. If you cannot inject the water, the  person with the feeding tube should lay on their left side.  Do not use a syringe smaller than 60 mL to flush the tubing.  Do not inject the water with force. 8. After injecting the water, remove the syringe. 9. Always flush the tube before giving the first medicine, between medicines, and after the final medicine before starting a feeding.  Do not mix medicines with liquid food (formula) before giving medicines.  Do not mix medicines with other medicines before giving medicines.  Completely flush medicines through the tube so they do not mix with the liquid food. 10. Throw away used supplies. 11. Remove gloves. 12. Wash hands. This information is not intended to replace advice given to you by your health care provider. Make sure you discuss any questions you have with your health care provider. Document Released: 12/14/2011 Document Revised: 08/27/2015 Document Reviewed: 11/03/2011 Elsevier Interactive Patient Education  2017 [employer].

## 2016-08-05 NOTE — Sedation Documentation (Signed)
Patient is resting comfortably. 

## 2016-08-05 NOTE — Progress Notes (Signed)
Oncology Nurse Navigator Documentation  Patient and wife arrived [facility] for PEG education. Using  PEG teaching device   and Teach Back, provided education for PEG use and care, including: hand hygiene, gravity bolus administration of daily water flushes, nutritional supplement, fluids and medications; care of tube insertion site including daily dressing change and cleaning; S&S of infection.  They correctly verbalized dressing change and cleaning procedures, provided correct return demonstration of dressing change and gravity administration of water.  I provided written instructions for PEG flushing/dressing change in support of verbal instruction.  They understand I will be available for ongoing PEG educational support.  I discussed contents of PEG starter kit to be delivered tomorrow to [facility] Short Stay by [employer] (6 syringes, ca 20 packets 4x4 split gauze, 3 rolls paper tape, bag of gloves).  Gayleen Orem, RN, BSN, [place] [facility] Oncology Nurse [place] at [place] [phone]

## 2016-08-05 NOTE — Sedation Documentation (Signed)
Patient denies pain and is resting comfortably.  

## 2016-08-05 NOTE — Procedures (Signed)
Tonsillar carcinoma with left cervical adenopathy  Status post right IJ single lumen power port catheter  Status post fluoroscopic 20 French pull through gastrostomy  No immediate complication  EBL less than 10 mL  Port catheter ready for use  Gastrostomy ready for use in 24 hours  Full reports in PACs.

## 2016-08-05 NOTE — H&P (Signed)
Chief Complaint: Patient was seen in consultation today for tonsil cancer  Referring Physician(s):  [place]  Supervising Physician: Daryll Brod  Patient Status: [facility] - Out-pt  History of Present Illness: Douglas Norris is a 61 y.o. male with past medical history of appendectomy presents with newly diagnosed tonsil cancer.  IR consulted for Port-A-Cath placement as well as gastrostomy tube placement at the request of Dr. Alvy Bimler.   Patient has been NPO.  He does not take blood thinners.  He has been in his usual state of health.   Past Medical History:  Diagnosis Date  . Heart murmur    as a child  . 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 APPENDECTOMY  03/02/12  . LAPAROSCOPIC APPENDECTOMY  03/02/2012   Procedure: APPENDECTOMY LAPAROSCOPIC;  Surgeon: Joyice Faster. Cornett, MD;  Location: [place] OR;  Service: General;  Laterality: N/A;  . WISDOM TOOTH EXTRACTION     lower molars bilat    Allergies: Patient has no known allergies.  Medications: Prior to Admission medications   Medication Sig Start Date End Date Taking? Authorizing Provider  HYDROcodone-acetaminophen (NORCO/VICODIN) 5-325 MG tablet Take 1 tablet by mouth every 6 (six) hours as needed for moderate pain.    Historical Provider, MD  Melatonin 10 MG TABS Take by mouth.    Historical Provider, MD  Multiple Vitamin (MULTIVITAMIN) tablet Take 1 tablet by mouth daily.    Historical Provider, MD  Omega-3 Fatty Acids (FISH OIL OMEGA-3 PO) Take by mouth.    Historical Provider, MD  VALERIAN ROOT PO Take by mouth.    Historical Provider, MD     Family History  Problem Relation Age of Onset  . Cancer Mother     breast  . Cancer Father     pancreatic  . Cancer Sister     breast  . Cancer Sister     breast    Social History   Social History  . Marital status: Married    Spouse name: N/A  . Number of children: 0  . Years of education:  N/A   Occupational History  . unemployed    Social History Main Topics  . Smoking status: Former Smoker    Quit date: 07/04/2003  . Smokeless tobacco: Never Used  . Alcohol use No     Comment: Quit 2005  . Drug use: No  . Sexual activity: Yes     Comment: i partner in last 12 months   Other Topics Concern  . Not on file   Social History Narrative  . No narrative on file    Review of Systems  HENT: Positive for sore throat (related to mass).   Respiratory: Negative for cough and shortness of breath.   Cardiovascular: Negative for chest pain.  Psychiatric/Behavioral: Negative for behavioral problems and confusion.    Vital Signs: There were no vitals taken for this visit.  Physical Exam  Constitutional: He is oriented to person, place, and time. He appears well-developed.  Cardiovascular: Normal rate, regular rhythm and normal heart sounds.   Pulmonary/Chest: Effort normal and breath sounds normal. No respiratory distress.  Abdominal: Soft.  Neurological: He is alert and oriented to person, place, and time.  Skin: Skin is warm and dry.  Psychiatric: He has a normal mood and affect. His behavior is normal. Judgment and thought content normal.  Nursing note and vitals reviewed.   Mallampati Score:  MD Evaluation  Airway: WNL Heart: WNL Abdomen: WNL Chest/ Lungs: WNL ASA  Classification: 3 Mallampati/Airway Score: Two  Imaging: No results found.  Labs:  CBC:  Recent Labs  06/17/16 1501 08/05/16 1159  WBC 8.8 8.7  HGB 15.4 14.7  HCT 43.1* 43.6  PLT  --  269    COAGS:  Recent Labs  08/05/16 1159  INR 0.93    BMP:  Recent Labs  06/17/16 1507 07/26/16 1124 08/05/16 1159  NA 140  --  140  K 4.1  --  3.9  CL 98  --  105  CO2 24  --  25  GLUCOSE 86  --  98  BUN 19 20.4 24*  CALCIUM 9.3  --  9.3  CREATININE 0.88 1.1 0.88  GFRNONAA 95  --  >60  GFRAA 109  --  >60    LIVER FUNCTION TESTS: No results for input(s): BILITOT, AST, ALT,  ALKPHOS, PROT, ALBUMIN in the last 8760 hours.  TUMOR MARKERS: No results for input(s): AFPTM, CEA, CA199, CHROMGRNA in the last 8760 hours.  Assessment and Plan: Patient with history of tonsil cancer presents for Port-A-Cath placement as well as gastrostomy tube placement at the request of Dr. Alvy Bimler.  Patient presents today in his usual state of health.  He has been NPO and does not take blood thinners.  Risks and benefits discussed with the patient including, but not limited to bleeding, infection, pneumothorax, or fibrin sheath development and need for additional procedures related to the port placement. Risks and benefits discussed with the patient including, but not limited to the need for a barium enema during the procedure, bleeding, infection, peritonitis, or damage to adjacent structures related to the gastrostomy placement. All of the patient's questions were answered, patient is agreeable to proceed. Consent signed and in chart.  Thank you for this interesting consult.  I greatly enjoyed meeting Douglas Norris and look forward to participating in their care.  A copy of this report was sent to the requesting provider on this date.  Electronically Signed: Docia Barrier 08/05/2016, 1:20 PM   I spent a total of  30 Minutes   in face to face in clinical consultation, greater than 50% of which was counseling/coordinating care for tonsil cancer

## 2016-08-06 NOTE — Progress Notes (Signed)
Oncology Nurse Navigator Documentation  Met with Mr. Tiffany Kocher prior to PEG/PAC placement. Reviewed contents of PEG starter kit including case of Osmolite 1.5 which he understands will be used at a later date per guidance from Dory [place], [place]. Provided pair a mesh briefs for securing PEG tube when not in use. He understands to contact me with needs/concerns.  Gayleen Orem, RN, BSN, [place] [facility] Oncology Nurse [place] at [place] [phone]

## 2016-08-09 ENCOUNTER — Ambulatory Visit (HOSPITAL_BASED_OUTPATIENT_CLINIC_OR_DEPARTMENT_OTHER): Payer: BLUE CROSS/BLUE SHIELD | Admitting: Hematology and Oncology

## 2016-08-09 ENCOUNTER — Telehealth: Payer: Self-pay | Admitting: Hematology and Oncology

## 2016-08-09 ENCOUNTER — Other Ambulatory Visit: Payer: BLUE CROSS/BLUE SHIELD

## 2016-08-09 ENCOUNTER — Other Ambulatory Visit: Payer: Self-pay | Admitting: Hematology and Oncology

## 2016-08-09 ENCOUNTER — Ambulatory Visit (HOSPITAL_BASED_OUTPATIENT_CLINIC_OR_DEPARTMENT_OTHER): Payer: BLUE CROSS/BLUE SHIELD

## 2016-08-09 ENCOUNTER — Encounter: Payer: Self-pay | Admitting: *Deleted

## 2016-08-09 ENCOUNTER — Other Ambulatory Visit (HOSPITAL_BASED_OUTPATIENT_CLINIC_OR_DEPARTMENT_OTHER): Payer: BLUE CROSS/BLUE SHIELD

## 2016-08-09 VITALS — BP 144/82 | HR 79 | Temp 98.9°F | Resp 18 | Ht 69.0 in | Wt 199.5 lb

## 2016-08-09 DIAGNOSIS — Z95828 Presence of other vascular implants and grafts: Secondary | ICD-10-CM

## 2016-08-09 DIAGNOSIS — C770 Secondary and unspecified malignant neoplasm of lymph nodes of head, face and neck: Secondary | ICD-10-CM | POA: Diagnosis not present

## 2016-08-09 DIAGNOSIS — C090 Malignant neoplasm of tonsillar fossa: Secondary | ICD-10-CM

## 2016-08-09 LAB — COMPREHENSIVE METABOLIC PANEL
ALBUMIN: 3 g/dL — AB (ref 3.5–5.0)
ALK PHOS: 76 U/L (ref 40–150)
ALT: 61 U/L — ABNORMAL HIGH (ref 0–55)
AST: 24 U/L (ref 5–34)
Anion Gap: 10 mEq/L (ref 3–11)
BUN: 17.3 mg/dL (ref 7.0–26.0)
CO2: 25 mEq/L (ref 22–29)
Calcium: 9.3 mg/dL (ref 8.4–10.4)
Chloride: 105 mEq/L (ref 98–109)
Creatinine: 0.8 mg/dL (ref 0.7–1.3)
EGFR: >90 mL/min/{1.73_m2} (ref >90)
Glucose: 102 mg/dl (ref 70–140)
POTASSIUM: 3.4 meq/L — AB (ref 3.5–5.1)
SODIUM: 140 meq/L (ref 136–145)
Total Bilirubin: 0.39 mg/dL (ref 0.20–1.20)
Total Protein: 7.1 g/dL (ref 6.4–8.3)

## 2016-08-09 LAB — CBC WITH DIFFERENTIAL/PLATELET
BASO%: 0.2 % (ref 0.0–2.0)
BASOS ABS: 0 10*3/uL (ref 0.0–0.1)
EOS%: 4.4 % (ref 0.0–7.0)
Eosinophils Absolute: 0.5 10*3/uL (ref 0.0–0.5)
HCT: 39.6 % (ref 38.4–49.9)
HEMOGLOBIN: 13.1 g/dL (ref 13.0–17.1)
LYMPH#: 1.1 10*3/uL (ref 0.9–3.3)
LYMPH%: 9.1 % — ABNORMAL LOW (ref 14.0–49.0)
MCH: 29.6 pg (ref 27.2–33.4)
MCHC: 33.1 g/dL (ref 32.0–36.0)
MCV: 89.3 fL (ref 79.3–98.0)
MONO#: 1.3 10*3/uL — ABNORMAL HIGH (ref 0.1–0.9)
MONO%: 11.4 % (ref 0.0–14.0)
NEUT%: 74.9 % (ref 39.0–75.0)
NEUTROS ABS: 8.8 10*3/uL — AB (ref 1.5–6.5)
Platelets: 283 10*3/uL (ref 140–400)
RBC: 4.43 10*6/uL (ref 4.20–5.82)
RDW: 13.3 % (ref 11.0–14.6)
WBC: 11.7 10*3/uL — ABNORMAL HIGH (ref 4.0–10.3)

## 2016-08-09 LAB — MAGNESIUM: MAGNESIUM: 2.1 mg/dL (ref 1.5–2.5)

## 2016-08-09 MED ORDER — SODIUM CHLORIDE 0.9% FLUSH
10.0000 mL | INTRAVENOUS | Status: DC | PRN
  Administered 2016-08-09: 10 mL via INTRAVENOUS
  Filled 2016-08-09: qty 10

## 2016-08-09 MED ORDER — HEPARIN SOD (PORK) LOCK FLUSH 100 UNIT/ML IV SOLN
500.0000 [IU] | Freq: Once | INTRAVENOUS | Status: AC
  Administered 2016-08-09: 500 [IU] via INTRAVENOUS
  Filled 2016-08-09: qty 5

## 2016-08-09 MED ORDER — LIDOCAINE-PRILOCAINE 2.5-2.5 % EX CREA: TOPICAL_CREAM | CUTANEOUS | 3 refills | Status: DC

## 2016-08-09 MED ORDER — ONDANSETRON HCL 8 MG PO TABS: 8.0000 mg | ORAL_TABLET | Freq: Two times a day (BID) | ORAL | 1 refills | Status: DC | PRN

## 2016-08-09 MED ORDER — PROCHLORPERAZINE MALEATE 10 MG PO TABS: 10.0000 mg | ORAL_TABLET | Freq: Four times a day (QID) | ORAL | 1 refills | Status: DC | PRN

## 2016-08-09 NOTE — Telephone Encounter (Signed)
Appointments scheduled per 08/09/16 LOS. Patient given AVS report and calendars with future scheduled appointments.  °

## 2016-08-10 ENCOUNTER — Ambulatory Visit
Admission: RE | Admit: 2016-08-10 | Discharge: 2016-08-10 | Disposition: A | Discharge status: Home / Self Care | Payer: BLUE CROSS/BLUE SHIELD | Source: Ambulatory Visit | Attending: Radiation Oncology | Admitting: Radiation Oncology

## 2016-08-10 ENCOUNTER — Telehealth: Payer: Self-pay | Admitting: *Deleted

## 2016-08-10 ENCOUNTER — Encounter: Payer: Self-pay | Admitting: Hematology and Oncology

## 2016-08-10 DIAGNOSIS — C770 Secondary and unspecified malignant neoplasm of lymph nodes of head, face and neck: Secondary | ICD-10-CM | POA: Insufficient documentation

## 2016-08-10 DIAGNOSIS — Z510 Encounter for antineoplastic radiation therapy: Secondary | ICD-10-CM | POA: Diagnosis not present

## 2016-08-10 NOTE — Assessment & Plan Note (Signed)
We discussed the role of chemotherapy. The intent is of curative intent.  We discussed some of the risks, benefits, side-effects of cisplatin  Some of the short term side-effects included, though not limited to, including weight loss, life threatening infections, risk of allergic reactions, need for transfusions of blood products, nausea, vomiting, change in bowel habits, loss of hair, admission to hospital for various reasons, and risks of death.   Long term side-effects are also discussed including risks of infertility, permanent damage to nerve function, hearing loss, chronic fatigue, kidney damage with possibility needing hemodialysis, and rare secondary malignancy including bone marrow disorders.  The patient is aware that the response rates discussed earlier is not guaranteed.  After a long discussion, patient made an informed decision to proceed with the prescribed plan of care.   Patient education material was dispensed. He will start treatment this week.  I will see him with blood draw on a weekly basis.

## 2016-08-10 NOTE — Telephone Encounter (Signed)
A user error has taken place: encounter opened in error, closed for administrative reasons.

## 2016-08-10 NOTE — Telephone Encounter (Signed)
Oncology Nurse Navigator Documentation  Returned patient's call, informed he can see Nutrition after RT tomorrow, will adjust post-SIM ed appt now scheduled for 1400.  Gayleen Orem, RN, BSN, [facility] Nurse [facility] [place] [phone]

## 2016-08-10 NOTE — Assessment & Plan Note (Signed)
He has palpable neck lymph node on exam. He is not symptomatic.  We will observe closely.

## 2016-08-10 NOTE — Progress Notes (Signed)
[facility] OFFICE PROGRESS NOTE  Patient Care Team: Patient, No Pcp Per as PCP - General (General Practice) Rozetta Nunnery, MD as Consulting Physician (Otolaryngology) Eppie Gibson, MD as Attending Physician (Radiation Oncology) Heath Lark, MD as Consulting Physician (Hematology and Oncology) Lenn Cal, DDS as Consulting Physician (Dentistry) Leota Sauers, RN as Oncology Nurse Navigator Karie Mainland, RD as Dietitian (Nutrition) Valentino Saxon [place], CCC-SLP as Speech Language Pathologist (Speech Pathology) Jomarie Longs, PT as Physical Therapist (Physical Therapy) Kennith Center, LCSW as Social Worker  SUMMARY OF ONCOLOGIC HISTORY:   Cancer of tonsillar fossa ([place])   06/27/2016 Imaging    Ct neck: Multiple malignant appearing lymph nodes in the left cervical chain as described, up to 23 mm in diameter. Possible early nodal disease in the right level 2 neck. Asymmetric fullness of the left tonsillar fossa, suspect squamous cell carcinoma primary.       06/28/2016 Pathology Results    Diagnosis Tonsil, biopsy, left - INVASIVE SQUAMOUS CELL CARCINOMA. - SEE COMMENT. Microscopic Comment The carcinoma appears moderately differentiated and is non-keratinizing. A p16 stain will be performed and the results reported separately The malignant cells are strongly and diffusely positive for p16.      07/05/2016 PET scan    1. Hypermetabolism in the region of the left palatini tonsil, consistent with known primary malignancy. 2. Hypermetabolic left-sided level II and III metastatic cervical lymphadenopathy. 3. 6 mm posterior left lower lobe pulmonary nodule. Attention on follow-up recommended as metastatic disease not excluded. 4. Cholelithiasis. 5. Nephrolithiasis. 6. Coronary artery and thoracoabdominal aortic atherosclerosis.      08/02/2016 Miscellaneous    Baseline hearing test is near normal      08/05/2016 Procedure    Fluoroscopic insertion  of a 20-French "pull-through" gastrostomy      08/05/2016 Procedure    Ultrasound and fluoroscopically guided right internal jugular single lumen power port catheter insertion. Tip in the SVC/RA junction. Catheter ready for use.       INTERVAL HISTORY: Please see below for problem oriented charting. He returns with his wife to discuss chemotherapy and for consent. He tolerated recent procedure well. Has minimum pain. No significant changes to the neck lymph node.  REVIEW OF SYSTEMS:   Constitutional: Denies fevers, chills or abnormal weight loss Eyes: Denies blurriness of vision Ears, nose, mouth, throat, and face: Denies mucositis or sore throat Respiratory: Denies cough, dyspnea or wheezes Cardiovascular: Denies palpitation, chest discomfort or lower extremity swelling Gastrointestinal:  Denies nausea, heartburn or change in bowel habits Skin: Denies abnormal skin rashes Neurological:Denies numbness, tingling or new weaknesses Behavioral/Psych: Mood is stable, no new changes  All other systems were reviewed with the patient and are negative.  I have reviewed the past medical history, past surgical history, social history and family history with the patient and they are unchanged from previous note.  ALLERGIES:  has No Known Allergies.  MEDICATIONS:  Current Outpatient Prescriptions  Medication Sig Dispense Refill  . acetaminophen (TYLENOL) 325 MG tablet Take 650 mg by mouth every 6 (six) hours as needed. Pt states he takes 3 tabs when he takes    . HYDROcodone-acetaminophen (NORCO/VICODIN) 5-325 MG tablet Take 1 tablet by mouth every 6 (six) hours as needed for moderate pain.    Marland Kitchen lidocaine-prilocaine (EMLA) cream Apply to affected area once 30 g 3  . Melatonin 10 MG TABS Take by mouth.    . Multiple Vitamin (MULTIVITAMIN) tablet Take 1 tablet by mouth  daily.    . Omega-3 Fatty Acids (FISH OIL OMEGA-3 PO) Take by mouth.    . ondansetron (ZOFRAN) 8 MG tablet Take 1 tablet (8  mg total) by mouth 2 (two) times daily as needed. Start on the third day after chemotherapy. 30 tablet 1  . prochlorperazine (COMPAZINE) 10 MG tablet Take 1 tablet (10 mg total) by mouth every 6 (six) hours as needed (Nausea or vomiting). 30 tablet 1  . VALERIAN ROOT PO Take by mouth.     No current facility-administered medications for this visit.     PHYSICAL EXAMINATION: ECOG PERFORMANCE STATUS: 0 - Asymptomatic  Vitals:   08/09/16 1359  BP: (!) 144/82  Pulse: 79  Resp: 18  Temp: 98.9 F (37.2 C)   Filed Weights   08/09/16 1359  Weight: 199 lb 8 oz (90.5 kg)    GENERAL:alert, no distress and comfortable SKIN: skin color, texture, turgor are normal, no rashes or significant lesions EYES: normal, Conjunctiva are pink and non-injected, sclera clear OROPHARYNX:no exudate, no erythema and lips, buccal mucosa, and tongue normal  NECK: supple, thyroid normal size, non-tender, without nodularity LYMPH: Palpable lymphadenopathy in the neck. LUNGS: clear to auscultation and percussion with normal breathing effort HEART: regular rate & rhythm and no murmurs and no lower extremity edema ABDOMEN:abdomen soft, non-tender and normal bowel sounds.  Feeding tube site looks okay Musculoskeletal:no cyanosis of digits and no clubbing  NEURO: alert & oriented x 3 with fluent speech, no focal motor/sensory deficits  LABORATORY DATA:  I have reviewed the data as listed    Component Value Date/Time   NA 140 08/09/2016 1325   K 3.4 (L) 08/09/2016 1325   CL 105 08/05/2016 1159   CO2 25 08/09/2016 1325   GLUCOSE 102 08/09/2016 1325   BUN 17.3 08/09/2016 1325   CREATININE 0.8 08/09/2016 1325   CALCIUM 9.3 08/09/2016 1325   PROT 7.1 08/09/2016 1325   ALBUMIN 3.0 (L) 08/09/2016 1325   AST 24 08/09/2016 1325   ALT 61 (H) 08/09/2016 1325   ALKPHOS 76 08/09/2016 1325   BILITOT 0.39 08/09/2016 1325   GFRNONAA >60 08/05/2016 1159   GFRAA >60 08/05/2016 1159    No results found for: SPEP,  UPEP  Lab Results  Component Value Date   WBC 11.7 (H) 08/09/2016   NEUTROABS 8.8 (H) 08/09/2016   HGB 13.1 08/09/2016   HCT 39.6 08/09/2016   MCV 89.3 08/09/2016   PLT 283 08/09/2016      Chemistry      Component Value Date/Time   NA 140 08/09/2016 1325   K 3.4 (L) 08/09/2016 1325   CL 105 08/05/2016 1159   CO2 25 08/09/2016 1325   BUN 17.3 08/09/2016 1325   CREATININE 0.8 08/09/2016 1325      Component Value Date/Time   CALCIUM 9.3 08/09/2016 1325   ALKPHOS 76 08/09/2016 1325   AST 24 08/09/2016 1325   ALT 61 (H) 08/09/2016 1325   BILITOT 0.39 08/09/2016 1325       RADIOGRAPHIC STUDIES: I have personally reviewed the radiological images as listed and agreed with the findings in the report. Ir Gastrostomy Tube Mod Sed  Result Date: 08/05/2016 INDICATION: Tonsillar squamous cell carcinoma EXAM: FLUOROSCOPIC 20 FRENCH PULL-THROUGH GASTROSTOMY Date:  5/4/20185/07/2016 3:37 pm Radiologist:  M. Daryll Brod, MD Guidance:  Fluoroscopic MEDICATIONS: Ancef 2 g; Antibiotics were administered within 1 hour of the procedure. Glucagon 0.5 mg IV ANESTHESIA/SEDATION: Versed 1.0 mg IV; Fentanyl 25 mcg IV Moderate Sedation Time:  10 minutes The patient was continuously monitored during the procedure by the interventional radiology nurse under my direct supervision. CONTRAST:  56mL ISOVUE-300 IOPAMIDOL (ISOVUE-300) INJECTION 61% - administered into the gastric lumen. FLUOROSCOPY TIME:  Fluoroscopy Time: 1 minutes 30 seconds (21 mGy). COMPLICATIONS: None immediate. PROCEDURE: Informed consent was obtained from the patient following explanation of the procedure, risks, benefits and alternatives. The patient understands, agrees and consents for the procedure. All questions were addressed. A time out was performed. Maximal barrier sterile technique utilized including caps, mask, sterile gowns, sterile gloves, large sterile drape, hand hygiene, and betadine prep. The left upper quadrant was sterilely  prepped and draped. An oral gastric catheter was inserted into the stomach under fluoroscopy. The existing nasogastric feeding tube was removed. Air was injected into the stomach for insufflation and visualization under fluoroscopy. The air distended stomach was confirmed beneath the anterior abdominal wall in the frontal and lateral projections. Under sterile conditions and local anesthesia, a 20 gauge trocar needle was utilized to access the stomach percutaneously beneath the left subcostal margin. Needle position was confirmed within the stomach under biplane fluoroscopy. Contrast injection confirmed position also. A single T tack was deployed for gastropexy. Over an Amplatz guide wire, a 9-French sheath was inserted into the stomach. A snare device was utilized to capture the oral gastric catheter. The snare device was pulled retrograde from the stomach up the esophagus and out the oropharynx. The 20-French pull-through gastrostomy was connected to the snare device and pulled antegrade through the oropharynx down the esophagus into the stomach and then through the percutaneous tract external to the patient. The gastrostomy was assembled externally. Contrast injection confirms position in the stomach. Images were obtained for documentation. The patient tolerated procedure well. No immediate complication. IMPRESSION: Fluoroscopic insertion of a 20-French "pull-through" gastrostomy. Electronically Signed   By: Jerilynn Mages.  Shick M.D.   On: 08/05/2016 15:49   Ir US Guide Vasc Access Right  Result Date: 08/05/2016 CLINICAL DATA:  Squamous cell tonsillar carcinoma EXAM: RIGHT INTERNAL JUGULAR SINGLE LUMEN POWER PORT CATHETER INSERTION Date:  5/4/20185/07/2016 3:38 pm Radiologist:  M. Daryll Brod, MD Guidance:  Ultrasound and fluoroscopic MEDICATIONS: Ancef 2 g; The antibiotic was administered within an appropriate time interval prior to skin puncture. ANESTHESIA/SEDATION: Versed 4 mg IV; Fentanyl 75 mcg IV; Moderate  Sedation Time:  35 minutes The patient was continuously monitored during the procedure by the interventional radiology nurse under my direct supervision. FLUOROSCOPY TIME:  24 seconds (4 mGy) COMPLICATIONS: None immediate. CONTRAST:  None. PROCEDURE: Informed consent was obtained from the patient following explanation of the procedure, risks, benefits and alternatives. The patient understands, agrees and consents for the procedure. All questions were addressed. A time out was performed. Maximal barrier sterile technique utilized including caps, mask, sterile gowns, sterile gloves, large sterile drape, hand hygiene, and 2% chlorhexidine scrub. Under sterile conditions and local anesthesia, right internal jugular micropuncture venous access was performed. Access was performed with ultrasound. Images were obtained for documentation. A guide wire was inserted followed by a transitional dilator. This allowed insertion of a guide wire and catheter into the IVC. Measurements were obtained from the SVC / RA junction back to the right IJ venotomy site. In the right infraclavicular chest, a subcutaneous pocket was created over the second anterior rib. This was done under sterile conditions and local anesthesia. 1% lidocaine with epinephrine was utilized for this. A 2.5 cm incision was made in the skin. Blunt dissection was performed to create a subcutaneous pocket  over the right pectoralis major muscle. The pocket was flushed with saline vigorously. There was adequate hemostasis. The port catheter was assembled and checked for leakage. The port catheter was secured in the pocket with two retention sutures. The tubing was tunneled subcutaneously to the right venotomy site and inserted into the SVC/RA junction through a valved peel-away sheath. Position was confirmed with fluoroscopy. Images were obtained for documentation. The patient tolerated the procedure well. No immediate complications. Incisions were closed in a two  layer fashion with 4 - 0 Vicryl suture. Dermabond was applied to the skin. The port catheter was accessed, blood was aspirated followed by saline and heparin flushes. Needle was removed. A dry sterile dressing was applied. IMPRESSION: Ultrasound and fluoroscopically guided right internal jugular single lumen power port catheter insertion. Tip in the SVC/RA junction. Catheter ready for use. Electronically Signed   By: Jerilynn Mages.  Shick M.D.   On: 08/05/2016 15:50   Ir Fluoro Guide Port Insertion Right  Result Date: 08/05/2016 CLINICAL DATA:  Squamous cell tonsillar carcinoma EXAM: RIGHT INTERNAL JUGULAR SINGLE LUMEN POWER PORT CATHETER INSERTION Date:  5/4/20185/07/2016 3:38 pm Radiologist:  M. Daryll Brod, MD Guidance:  Ultrasound and fluoroscopic MEDICATIONS: Ancef 2 g; The antibiotic was administered within an appropriate time interval prior to skin puncture. ANESTHESIA/SEDATION: Versed 4 mg IV; Fentanyl 75 mcg IV; Moderate Sedation Time:  35 minutes The patient was continuously monitored during the procedure by the interventional radiology nurse under my direct supervision. FLUOROSCOPY TIME:  24 seconds (4 mGy) COMPLICATIONS: None immediate. CONTRAST:  None. PROCEDURE: Informed consent was obtained from the patient following explanation of the procedure, risks, benefits and alternatives. The patient understands, agrees and consents for the procedure. All questions were addressed. A time out was performed. Maximal barrier sterile technique utilized including caps, mask, sterile gowns, sterile gloves, large sterile drape, hand hygiene, and 2% chlorhexidine scrub. Under sterile conditions and local anesthesia, right internal jugular micropuncture venous access was performed. Access was performed with ultrasound. Images were obtained for documentation. A guide wire was inserted followed by a transitional dilator. This allowed insertion of a guide wire and catheter into the IVC. Measurements were obtained from the SVC / RA  junction back to the right IJ venotomy site. In the right infraclavicular chest, a subcutaneous pocket was created over the second anterior rib. This was done under sterile conditions and local anesthesia. 1% lidocaine with epinephrine was utilized for this. A 2.5 cm incision was made in the skin. Blunt dissection was performed to create a subcutaneous pocket over the right pectoralis major muscle. The pocket was flushed with saline vigorously. There was adequate hemostasis. The port catheter was assembled and checked for leakage. The port catheter was secured in the pocket with two retention sutures. The tubing was tunneled subcutaneously to the right venotomy site and inserted into the SVC/RA junction through a valved peel-away sheath. Position was confirmed with fluoroscopy. Images were obtained for documentation. The patient tolerated the procedure well. No immediate complications. Incisions were closed in a two layer fashion with 4 - 0 Vicryl suture. Dermabond was applied to the skin. The port catheter was accessed, blood was aspirated followed by saline and heparin flushes. Needle was removed. A dry sterile dressing was applied. IMPRESSION: Ultrasound and fluoroscopically guided right internal jugular single lumen power port catheter insertion. Tip in the SVC/RA junction. Catheter ready for use. Electronically Signed   By: Jerilynn Mages.  Shick M.D.   On: 08/05/2016 15:50    ASSESSMENT & PLAN:  Cancer of tonsillar fossa ([place]) We discussed the role of chemotherapy. The intent is of curative intent.  We discussed some of the risks, benefits, side-effects of cisplatin  Some of the short term side-effects included, though not limited to, including weight loss, life threatening infections, risk of allergic reactions, need for transfusions of blood products, nausea, vomiting, change in bowel habits, loss of hair, admission to hospital for various reasons, and risks of death.   Long term side-effects are also  discussed including risks of infertility, permanent damage to nerve function, hearing loss, chronic fatigue, kidney damage with possibility needing hemodialysis, and rare secondary malignancy including bone marrow disorders.  The patient is aware that the response rates discussed earlier is not guaranteed.  After a long discussion, patient made an informed decision to proceed with the prescribed plan of care.   Patient education material was dispensed. He will start treatment this week.  I will see him with blood draw on a weekly basis.   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 He is not symptomatic.  We will observe closely.   Orders Placed This Encounter  Procedures  . Comprehensive metabolic panel    Standing Status:   Standing    Number of Occurrences:   22    Standing Expiration Date:   08/09/2017  . Magnesium    Standing Status:   Standing    Number of Occurrences:   22    Standing Expiration Date:   08/09/2017  . CBC with Differential    Standing Status:   Standing    Number of Occurrences:   20    Standing Expiration Date:   08/10/2017   All questions were answered. The patient knows to call the clinic with any problems, questions or concerns. No barriers to learning was detected. I spent 20 minutes counseling the patient face to face. The total time spent in the appointment was 30 minutes and more than 50% was on counseling and review of test results     Heath Lark, MD 08/10/2016 6:57 AM

## 2016-08-11 ENCOUNTER — Ambulatory Visit
Admission: RE | Admit: 2016-08-11 | Discharge: 2016-08-11 | Disposition: A | Discharge status: Home / Self Care | Payer: BLUE CROSS/BLUE SHIELD | Source: Ambulatory Visit | Attending: Radiation Oncology | Admitting: Radiation Oncology

## 2016-08-11 ENCOUNTER — Ambulatory Visit: Payer: BLUE CROSS/BLUE SHIELD | Admitting: Nutrition

## 2016-08-11 ENCOUNTER — Inpatient Hospital Stay: Admission: RE | Admit: 2016-08-11 | Payer: Self-pay | Source: Ambulatory Visit

## 2016-08-11 DIAGNOSIS — C770 Secondary and unspecified malignant neoplasm of lymph nodes of head, face and neck: Secondary | ICD-10-CM

## 2016-08-11 DIAGNOSIS — Z510 Encounter for antineoplastic radiation therapy: Secondary | ICD-10-CM | POA: Diagnosis not present

## 2016-08-11 DIAGNOSIS — C090 Malignant neoplasm of tonsillar fossa: Secondary | ICD-10-CM

## 2016-08-11 MED ORDER — BIAFINE EX EMUL
CUTANEOUS | Status: DC | PRN
  Administered 2016-08-11: 15:00:00 via TOPICAL

## 2016-08-11 NOTE — Progress Notes (Signed)
Pt here for patient teaching.  Pt given Radiation and You booklet, Managing Acute Radiation Side Effects for Head and Neck Cancer handout, skin care instructions, Alra deodorant and Sonafine.  Reviewed areas of pertinence such as fatigue, hair loss, mouth changes, skin changes, throat changes and taste changes . Pt able to give teach back of to pat skin, use unscented/gentle soap and drink plenty of water,apply Sonafine bid, avoid applying anything to skin within 4 hours of treatment and to use an electric razor if they must shave. Pt verbalizes understanding of information given and will contact nursing with any questions or concerns.     Http://rtanswers.org/treatmentinformation/whattoexpect/index  Managing Acute Radiation Side Effects for Head and Neck Cancer  Skin irritation:  . Sonafine  Topical Emulsion: First-line topical cream to help soothe skin irritation.  Apply to skin in radiation fields at least 4 hours before radiotherapy, or any time after treatments during the rest of the day.  . Triple Antibiotic Ointment (Neosporin): Apply to areas of skin with moist breakdown to prevent infection.  . 1% hydrocortisone cream: Apply to areas of skin that are itching, up to three times a day.  . Silvadene (Silver Sulfadiazine): Used in select cases if large patches of skin develop moist breakdown (let physician or nurse know if you have a "sulfa" drug allergy)  Soreness in mouth or throat: . Baking Soda Rinse: a home remedy to soothe/cleanse mouth and loosen thick saliva.  Mix 1/2 teaspoon salt, 1/2 teaspoon baking soda, 1 pint water (16 oz or two cups).  Swish, gargle and spit as needed to soothe/cleanse mouth. Use as often as you want.  . Sucralfate (Carafate): coats throat to soothe it before meals or any time of day. Crush 1 tablet in 10 mL H20 and swallow up to four times a day.  . 2% viscous Lidocaine (Magic Mouth Wash): Soothes mouth and/or throat by numbing your mucous membranes. Mix 1  part 2% viscous lidocaine (Magic Mouth Wash), 1 part H20. Swish and/or swallow 10mL of this mixture, 30min before meals and at bedtime, up to four times a day. Alternate with Sucralfate (Carafate).  . Narcotics: Various short acting and long acting narcotics can be prescribed.  Often, medical oncology will prescribe these if you are receiving chemotherapy concurrently. Narcotics may cause constipation. It may be helpful to take a stool softener (Docusate Sodium) or gentle laxative (ie Senna or Polyethylene Glycol) to prevent constipation.  Having food in your stomach before ingesting a narcotic may reduce risk of stomach upset.  Thick Saliva: . Baking Soda Rinse: a home remedy to soothe/cleanse mouth and loosen thick saliva.  Mix 1/2 teaspoon salt, 1/2 teaspoon baking soda, 1 pint water (16 oz or two cups).  Swish, gargle, and spit as needed to soothe/cleanse mouth. Use as often as you want.  . Some patients find Diet Ginger Ale or Papaya Juice to be helpful.  . In extreme cases, your physician may consider prescribing a Scopolamine transdermal patch which dries up your saliva.     Poor taste, or lack of taste:   . There are no well-established medications to combat taste bud changes from radiotherapy.  It often takes weeks to months to regain taste function.  Eating bland foods and drinking nutritional shakes  may help you maintain your weight when food is not enjoyable.  Some patients supplement their oral intake with a feeding tube.  Fatigue and weakness: . There is not a well-established safe and effective medication to combat radiation-induced fatigue.    However, if you are able to perform light exercise (such as a daily walk, yoga, recumbent stationary bicycling), this may combat fatigue and help you maintain muscle mass during treatment.  . Maintaining hydration and nutrition are also important.  If you have not been referred to a nutritionist and would like a referral, please let your nurse  or physician know.  . Try to get at least 8 hours of sleep each night. You may need a daily nap, but try not to nap so late that it interferes with your nightly sleep schedule.       

## 2016-08-11 NOTE — Progress Notes (Signed)
Nutrition follow-up completed with patient diagnosed with tonsil cancer. Patient will be receiving concurrent chemoradiation therapy. Weight was documented as 199.5 pounds on May 8. Patient status post PEG and port Friday, May 4.  Patient reports feeding tube does not seem to be  working correctly.  Nutrition Diagnosis: Predicted Suboptimal Energy Intake Continues.  Intervention: Provided Support and Encouragement for patient. Enforced previous education. [place], Head and neck navigator, to evaluate feeding tube when patient is here for chemotherapy.  Monitoring, evaluation, goals: Patient will tolerate adequate calories and protein for minimal weight loss.  Next visit: Thursday, May 17 after infusion.  **Disclaimer: This note was dictated with voice recognition software. Similar sounding words can inadvertently be transcribed and this note may contain transcription errors which may not have been corrected upon publication of note.**

## 2016-08-12 ENCOUNTER — Ambulatory Visit (HOSPITAL_BASED_OUTPATIENT_CLINIC_OR_DEPARTMENT_OTHER): Payer: BLUE CROSS/BLUE SHIELD

## 2016-08-12 ENCOUNTER — Encounter: Payer: Self-pay | Admitting: *Deleted

## 2016-08-12 ENCOUNTER — Ambulatory Visit
Admission: RE | Admit: 2016-08-12 | Discharge: 2016-08-12 | Disposition: A | Discharge status: Home / Self Care | Payer: BLUE CROSS/BLUE SHIELD | Source: Ambulatory Visit | Attending: Radiation Oncology | Admitting: Radiation Oncology

## 2016-08-12 VITALS — BP 128/87 | HR 76 | Temp 98.5°F | Resp 18

## 2016-08-12 DIAGNOSIS — C770 Secondary and unspecified malignant neoplasm of lymph nodes of head, face and neck: Secondary | ICD-10-CM | POA: Diagnosis not present

## 2016-08-12 DIAGNOSIS — Z5111 Encounter for antineoplastic chemotherapy: Secondary | ICD-10-CM

## 2016-08-12 DIAGNOSIS — C090 Malignant neoplasm of tonsillar fossa: Secondary | ICD-10-CM | POA: Diagnosis not present

## 2016-08-12 DIAGNOSIS — Z510 Encounter for antineoplastic radiation therapy: Secondary | ICD-10-CM | POA: Diagnosis not present

## 2016-08-12 MED ORDER — PALONOSETRON HCL INJECTION 0.25 MG/5ML
INTRAVENOUS | Status: AC
  Filled 2016-08-12: qty 5

## 2016-08-12 MED ORDER — MANNITOL 25 % IV SOLN
Freq: Once | INTRAVENOUS | Status: AC
  Administered 2016-08-12: 10:00:00 via INTRAVENOUS
  Filled 2016-08-12: qty 10

## 2016-08-12 MED ORDER — PALONOSETRON HCL INJECTION 0.25 MG/5ML
0.2500 mg | Freq: Once | INTRAVENOUS | Status: AC
  Administered 2016-08-12: 0.25 mg via INTRAVENOUS

## 2016-08-12 MED ORDER — SODIUM CHLORIDE 0.9% FLUSH
10.0000 mL | INTRAVENOUS | Status: DC | PRN
  Administered 2016-08-12: 10 mL
  Filled 2016-08-12: qty 10

## 2016-08-12 MED ORDER — SODIUM CHLORIDE 0.9 % IV SOLN
95.0000 mg/m2 | Freq: Once | INTRAVENOUS | Status: AC
  Administered 2016-08-12: 200 mg via INTRAVENOUS
  Filled 2016-08-12: qty 100

## 2016-08-12 MED ORDER — FOSAPREPITANT DIMEGLUMINE INJECTION 150 MG
Freq: Once | INTRAVENOUS | Status: AC
  Administered 2016-08-12: 12:00:00 via INTRAVENOUS
  Filled 2016-08-12: qty 5

## 2016-08-12 MED ORDER — HEPARIN SOD (PORK) LOCK FLUSH 100 UNIT/ML IV SOLN
500.0000 [IU] | Freq: Once | INTRAVENOUS | Status: AC | PRN
Start: 2016-08-12 — End: 2016-08-12
  Administered 2016-08-12: 500 [IU]
  Filled 2016-08-12: qty 5

## 2016-08-12 NOTE — Patient Instructions (Signed)
[facility] Discharge Instructions for Patients Receiving Chemotherapy  Today you received the following chemotherapy agent: Cisplatin.  To help prevent nausea and vomiting after your treatment, we encourage you to take your nausea medication as prescribed.   If you develop nausea and vomiting that is not controlled by your nausea medication, call the clinic.   BELOW ARE SYMPTOMS THAT SHOULD BE REPORTED IMMEDIATELY:  *FEVER GREATER THAN 100.5 F  *CHILLS WITH OR WITHOUT FEVER  NAUSEA AND VOMITING THAT IS NOT CONTROLLED WITH YOUR NAUSEA MEDICATION  *UNUSUAL SHORTNESS OF BREATH  *UNUSUAL BRUISING OR BLEEDING  TENDERNESS IN MOUTH AND THROAT WITH OR WITHOUT PRESENCE OF ULCERS  *URINARY PROBLEMS  *BOWEL PROBLEMS  UNUSUAL RASH Items with * indicate a potential emergency and should be followed up as soon as possible.  Feel free to call the clinic you have any questions or concerns. The clinic phone number is [phone].  Please show the CHEMO ALERT CARD at check-in to the Emergency Department and triage nurse.  Cisplatin injection What is this medicine? CISPLATIN (SIS pla tin) is a chemotherapy drug. It targets fast dividing cells, like cancer cells, and causes these cells to die. This medicine is used to treat many types of cancer like bladder, ovarian, and testicular cancers. This medicine may be used for other purposes; ask your health care provider or pharmacist if you have questions. COMMON BRAND NAME(S): Platinol, Platinol -AQ What should I tell my health care provider before I take this medicine? They need to know if you have any of these conditions: -blood disorders -hearing problems -kidney disease -recent or ongoing radiation therapy -an unusual or allergic reaction to cisplatin, carboplatin, other chemotherapy, other medicines, foods, dyes, or preservatives -pregnant or trying to get pregnant -breast-feeding How should I use this medicine? This  drug is given as an infusion into a vein. It is administered in a hospital or clinic by a specially trained health care professional. Talk to your pediatrician regarding the use of this medicine in children. Special care may be needed. Overdosage: If you think you have taken too much of this medicine contact a poison control center or emergency room at once. NOTE: This medicine is only for you. Do not share this medicine with others. What if I miss a dose? It is important not to miss a dose. Call your doctor or health care professional if you are unable to keep an appointment. What may interact with this medicine? -dofetilide -foscarnet -medicines for seizures -medicines to increase blood counts like filgrastim, pegfilgrastim, sargramostim -probenecid -pyridoxine used with altretamine -rituximab -some antibiotics like amikacin, gentamicin, neomycin, polymyxin B, streptomycin, tobramycin -sulfinpyrazone -vaccines -zalcitabine Talk to your doctor or health care professional before taking any of these medicines: -acetaminophen -aspirin -ibuprofen -ketoprofen -naproxen This list may not describe all possible interactions. Give your health care provider a list of all the medicines, herbs, non-prescription drugs, or dietary supplements you use. Also tell them if you smoke, drink alcohol, or use illegal drugs. Some items may interact with your medicine. What should I watch for while using this medicine? Your condition will be monitored carefully while you are receiving this medicine. You will need important blood work done while you are taking this medicine. This drug may make you feel generally unwell. This is not uncommon, as chemotherapy can affect healthy cells as well as cancer cells. Report any side effects. Continue your course of treatment even though you feel ill unless your doctor tells you to stop. In   some cases, you may be given additional medicines to help with side effects. Follow  all directions for their use. Call your doctor or health care professional for advice if you get a fever, chills or sore throat, or other symptoms of a cold or flu. Do not treat yourself. This drug decreases your body's ability to fight infections. Try to avoid being around people who are sick. This medicine may increase your risk to bruise or bleed. Call your doctor or health care professional if you notice any unusual bleeding. Be careful brushing and flossing your teeth or using a toothpick because you may get an infection or bleed more easily. If you have any dental work done, tell your dentist you are receiving this medicine. Avoid taking products that contain aspirin, acetaminophen, ibuprofen, naproxen, or ketoprofen unless instructed by your doctor. These medicines may hide a fever. Do not become pregnant while taking this medicine. Women should inform their doctor if they wish to become pregnant or think they might be pregnant. There is a potential for serious side effects to an unborn child. Talk to your health care professional or pharmacist for more information. Do not breast-feed an infant while taking this medicine. Drink fluids as directed while you are taking this medicine. This will help protect your kidneys. Call your doctor or health care professional if you get diarrhea. Do not treat yourself. What side effects may I notice from receiving this medicine? Side effects that you should report to your doctor or health care professional as soon as possible: -allergic reactions like skin rash, itching or hives, swelling of the face, lips, or tongue -signs of infection - fever or chills, cough, sore throat, pain or difficulty passing urine -signs of decreased platelets or bleeding - bruising, pinpoint red spots on the skin, black, tarry stools, nosebleeds -signs of decreased red blood cells - unusually weak or tired, fainting spells, lightheadedness -breathing problems -changes in  hearing -gout pain -low blood counts - This drug may decrease the number of white blood cells, red blood cells and platelets. You may be at increased risk for infections and bleeding. -nausea and vomiting -pain, swelling, redness or irritation at the injection site -pain, tingling, numbness in the hands or feet -problems with balance, movement -trouble passing urine or change in the amount of urine Side effects that usually do not require medical attention (report to your doctor or health care professional if they continue or are bothersome): -changes in vision -loss of appetite -metallic taste in the mouth or changes in taste This list may not describe all possible side effects. Call your doctor for medical advice about side effects. You may report side effects to FDA at 1-800-FDA-1088. Where should I keep my medicine? This drug is given in a hospital or clinic and will not be stored at home. NOTE: This sheet is a summary. It may not cover all possible information. If you have questions about this medicine, talk to your doctor, pharmacist, or health care provider.  2018 Elsevier/Gold Standard (2007-06-26 14:40:54)  

## 2016-08-13 ENCOUNTER — Ambulatory Visit (HOSPITAL_BASED_OUTPATIENT_CLINIC_OR_DEPARTMENT_OTHER): Payer: BLUE CROSS/BLUE SHIELD

## 2016-08-13 VITALS — BP 126/84 | HR 77 | Temp 98.6°F | Resp 18

## 2016-08-13 DIAGNOSIS — C090 Malignant neoplasm of tonsillar fossa: Secondary | ICD-10-CM

## 2016-08-13 MED ORDER — SODIUM CHLORIDE 0.9% FLUSH
10.0000 mL | INTRAVENOUS | Status: DC | PRN
  Administered 2016-08-13: 10 mL
  Filled 2016-08-13: qty 10

## 2016-08-13 MED ORDER — SODIUM CHLORIDE 0.9 % IV SOLN: Freq: Once | INTRAVENOUS | Status: DC

## 2016-08-13 MED ORDER — SODIUM CHLORIDE 0.9 % IV SOLN
Freq: Once | INTRAVENOUS | Status: AC
  Administered 2016-08-13: 09:00:00 via INTRAVENOUS

## 2016-08-13 MED ORDER — HEPARIN SOD (PORK) LOCK FLUSH 100 UNIT/ML IV SOLN
500.0000 [IU] | Freq: Once | INTRAVENOUS | Status: AC | PRN
  Administered 2016-08-13: 500 [IU]
  Filled 2016-08-13: qty 5

## 2016-08-13 NOTE — Patient Instructions (Signed)
Dehydration, Adult Dehydration is when there is not enough fluid or water in your body. This happens when you lose more fluids than you take in. Dehydration can range from mild to very bad. It should be treated right away to keep it from getting very bad. Symptoms of mild dehydration may include:   Thirst.  Dry lips.  Slightly dry mouth.  Dry, warm skin.  Dizziness. Symptoms of moderate dehydration may include:   Very dry mouth.  Muscle cramps.  Dark pee (urine). Pee may be the color of tea.  Your body making less pee.  Your eyes making fewer tears.  Heartbeat that is uneven or faster than normal (palpitations).  Headache.  Light-headedness, especially when you stand up from sitting.  Fainting (syncope). Symptoms of very bad dehydration may include:   Changes in skin, such as:  Cold and clammy skin.  Blotchy (mottled) or pale skin.  Skin that does not quickly return to normal after being lightly pinched and let go (poor skin turgor).  Changes in body fluids, such as:  Feeling very thirsty.  Your eyes making fewer tears.  Not sweating when body temperature is high, such as in hot weather.  Your body making very little pee.  Changes in vital signs, such as:  Weak pulse.  Pulse that is more than 100 beats a minute when you are sitting still.  Fast breathing.  Low blood pressure.  Other changes, such as:  Sunken eyes.  Cold hands and feet.  Confusion.  Lack of energy (lethargy).  Trouble waking up from sleep.  Short-term weight loss.  Unconsciousness. Follow these instructions at home:  If told by your doctor, drink an ORS:  Make an ORS by using instructions on the package.  Start by drinking small amounts, about  cup (120 mL) every 5-10 minutes.  Slowly drink more until you have had the amount that your doctor said to have.  Drink enough clear fluid to keep your pee clear or pale yellow. If you were told to drink an ORS, finish the  ORS first, then start slowly drinking clear fluids. Drink fluids such as:  Water. Do not drink only water by itself. Doing that can make the salt (sodium) level in your body get too low (hyponatremia).  Ice chips.  Fruit juice that you have added water to (diluted).  Low-calorie sports drinks.  Avoid:  Alcohol.  Drinks that have a lot of sugar. These include high-calorie sports drinks, fruit juice that does not have water added, and soda.  Caffeine.  Foods that are greasy or have a lot of fat or sugar.  Take over-the-counter and prescription medicines only as told by your doctor.  Do not take salt tablets. Doing that can make the salt level in your body get too high (hypernatremia).  Eat foods that have minerals (electrolytes). Examples include bananas, oranges, potatoes, tomatoes, and spinach.  Keep all follow-up visits as told by your doctor. This is important. Contact a doctor if:  You have belly (abdominal) pain that:  Gets worse.  Stays in one area (localizes).  You have a rash.  You have a stiff neck.  You get angry or annoyed more easily than normal (irritability).  You are more sleepy than normal.  You have a harder time waking up than normal.  You feel:  Weak.  Dizzy.  Very thirsty.  You have peed (urinated) only a small amount of very dark pee during 6-8 hours. Get help right away if:  You   have symptoms of very bad dehydration.  You cannot drink fluids without throwing up (vomiting).  Your symptoms get worse with treatment.  You have a fever.  You have a very bad headache.  You are throwing up or having watery poop (diarrhea) and it:  Gets worse.  Does not go away.  You have blood or something green (bile) in your throw-up.  You have blood in your poop (stool). This may cause poop to look black and tarry.  You have not peed in 6-8 hours.  You pass out (faint).  Your heart rate when you are sitting still is more than 100 beats a  minute.  You have trouble breathing. This information is not intended to replace advice given to you by your health care provider. Make sure you discuss any questions you have with your health care provider. Document Released: 01/15/2009 Document Revised: 10/09/2015 Document Reviewed: 05/15/2015 Elsevier Interactive Patient Education  2017 [employer]  

## 2016-08-15 ENCOUNTER — Telehealth: Payer: Self-pay

## 2016-08-15 ENCOUNTER — Ambulatory Visit
Admission: RE | Admit: 2016-08-15 | Discharge: 2016-08-15 | Disposition: A | Discharge status: Home / Self Care | Payer: BLUE CROSS/BLUE SHIELD | Source: Ambulatory Visit | Attending: Radiation Oncology | Admitting: Radiation Oncology

## 2016-08-15 DIAGNOSIS — Z510 Encounter for antineoplastic radiation therapy: Secondary | ICD-10-CM | POA: Diagnosis not present

## 2016-08-15 NOTE — Telephone Encounter (Signed)
Called and left message to call for questions or concerns, and this was a follow up call to see how the patient was doing.

## 2016-08-15 NOTE — Telephone Encounter (Signed)
-----   Message from Ma Hillock, RN sent at 08/12/2016  4:20 PM EDT ----- Regarding: Alvy Bimler: 1st Chemo F/U Patient received 1st dose of cisplatin on 08/12/16. Patient tolerated the infusion with no difficulty. Please call patient to follow up.

## 2016-08-16 ENCOUNTER — Ambulatory Visit
Admission: RE | Admit: 2016-08-16 | Discharge: 2016-08-16 | Disposition: A | Discharge status: Home / Self Care | Payer: BLUE CROSS/BLUE SHIELD | Source: Ambulatory Visit | Attending: Radiation Oncology | Admitting: Radiation Oncology

## 2016-08-16 DIAGNOSIS — Z510 Encounter for antineoplastic radiation therapy: Secondary | ICD-10-CM | POA: Diagnosis not present

## 2016-08-17 ENCOUNTER — Other Ambulatory Visit (HOSPITAL_BASED_OUTPATIENT_CLINIC_OR_DEPARTMENT_OTHER): Payer: BLUE CROSS/BLUE SHIELD

## 2016-08-17 ENCOUNTER — Ambulatory Visit
Admission: RE | Admit: 2016-08-17 | Discharge: 2016-08-17 | Disposition: A | Discharge status: Home / Self Care | Payer: BLUE CROSS/BLUE SHIELD | Source: Ambulatory Visit | Attending: Radiation Oncology | Admitting: Radiation Oncology

## 2016-08-17 DIAGNOSIS — C090 Malignant neoplasm of tonsillar fossa: Secondary | ICD-10-CM | POA: Diagnosis not present

## 2016-08-17 DIAGNOSIS — Z510 Encounter for antineoplastic radiation therapy: Secondary | ICD-10-CM | POA: Diagnosis not present

## 2016-08-17 LAB — CBC WITH DIFFERENTIAL/PLATELET
BASO%: 0.2 % (ref 0.0–2.0)
Basophils Absolute: 0 10*3/uL (ref 0.0–0.1)
EOS%: 1.5 % (ref 0.0–7.0)
Eosinophils Absolute: 0.1 10*3/uL (ref 0.0–0.5)
HCT: 42.1 % (ref 38.4–49.9)
HGB: 14 g/dL (ref 13.0–17.1)
LYMPH%: 8.5 % — AB (ref 14.0–49.0)
MCH: 29.7 pg (ref 27.2–33.4)
MCHC: 33.4 g/dL (ref 32.0–36.0)
MCV: 89.1 fL (ref 79.3–98.0)
MONO#: 0.6 10*3/uL (ref 0.1–0.9)
MONO%: 8.2 % (ref 0.0–14.0)
NEUT%: 81.6 % — AB (ref 39.0–75.0)
NEUTROS ABS: 6.3 10*3/uL (ref 1.5–6.5)
PLATELETS: 370 10*3/uL (ref 140–400)
RBC: 4.72 10*6/uL (ref 4.20–5.82)
RDW: 13.2 % (ref 11.0–14.6)
WBC: 7.7 10*3/uL (ref 4.0–10.3)
lymph#: 0.7 10*3/uL — ABNORMAL LOW (ref 0.9–3.3)

## 2016-08-17 LAB — COMPREHENSIVE METABOLIC PANEL
ALT: 34 U/L (ref 0–55)
ANION GAP: 7 meq/L (ref 3–11)
AST: 16 U/L (ref 5–34)
Albumin: 3.2 g/dL — ABNORMAL LOW (ref 3.5–5.0)
Alkaline Phosphatase: 58 U/L (ref 40–150)
BUN: 19.3 mg/dL (ref 7.0–26.0)
CHLORIDE: 101 meq/L (ref 98–109)
CO2: 27 meq/L (ref 22–29)
Calcium: 8.8 mg/dL (ref 8.4–10.4)
Creatinine: 0.9 mg/dL (ref 0.7–1.3)
GLUCOSE: 103 mg/dL (ref 70–140)
Potassium: 4 mEq/L (ref 3.5–5.1)
SODIUM: 135 meq/L — AB (ref 136–145)
TOTAL PROTEIN: 6.9 g/dL (ref 6.4–8.3)
Total Bilirubin: 0.29 mg/dL (ref 0.20–1.20)

## 2016-08-17 LAB — MAGNESIUM: Magnesium: 1.9 mg/dl (ref 1.5–2.5)

## 2016-08-17 NOTE — Progress Notes (Signed)
Oncology Nurse Navigator Documentation  Met with Mr. Douglas Norris in Infusion during initial chemo to check on his well-being, to address concerns re PEG. He reported tolerating chemo without difficulty. He expressed concern water is instilling too slowly during flushes, his wife has been using syringe.  I evaluated PEG, outward appearance WNL.  He demonstrated PEG flush, I reminded him to fully extend tube, water passed at acceptable rate for gravity feed.  He voiced understanding PEG functioning as expected. He denied other concerns/needs, understands I can be contacted.  Gayleen Orem, RN, BSN, [place] [facility] Nurse [place] [facility] [place] [phone]

## 2016-08-18 ENCOUNTER — Ambulatory Visit (HOSPITAL_BASED_OUTPATIENT_CLINIC_OR_DEPARTMENT_OTHER): Payer: BLUE CROSS/BLUE SHIELD | Admitting: Hematology and Oncology

## 2016-08-18 ENCOUNTER — Ambulatory Visit: Payer: BLUE CROSS/BLUE SHIELD | Admitting: Nutrition

## 2016-08-18 ENCOUNTER — Encounter: Payer: Self-pay | Admitting: Hematology and Oncology

## 2016-08-18 ENCOUNTER — Ambulatory Visit
Admission: RE | Admit: 2016-08-18 | Discharge: 2016-08-18 | Disposition: A | Discharge status: Home / Self Care | Payer: BLUE CROSS/BLUE SHIELD | Source: Ambulatory Visit | Attending: Radiation Oncology | Admitting: Radiation Oncology

## 2016-08-18 DIAGNOSIS — R634 Abnormal weight loss: Secondary | ICD-10-CM

## 2016-08-18 DIAGNOSIS — C770 Secondary and unspecified malignant neoplasm of lymph nodes of head, face and neck: Secondary | ICD-10-CM

## 2016-08-18 DIAGNOSIS — Z510 Encounter for antineoplastic radiation therapy: Secondary | ICD-10-CM | POA: Diagnosis not present

## 2016-08-18 DIAGNOSIS — C090 Malignant neoplasm of tonsillar fossa: Secondary | ICD-10-CM | POA: Diagnosis not present

## 2016-08-18 NOTE — Progress Notes (Signed)
[facility] OFFICE PROGRESS NOTE  Patient Care Team: Patient, No Pcp Per as PCP - General (General Practice) Rozetta Nunnery, MD as Consulting Physician (Otolaryngology) Eppie Gibson, MD as Attending Physician (Radiation Oncology) Heath Lark, MD as Consulting Physician (Hematology and Oncology) Lenn Cal, DDS as Consulting Physician (Dentistry) Leota Sauers, RN as Oncology Nurse Navigator Karie Mainland, RD as Dietitian (Nutrition) Valentino Saxon Perry Mount, CCC-SLP as Speech Language Pathologist (Speech Pathology) Jomarie Longs, PT as Physical Therapist (Physical Therapy) Kennith Center, LCSW as Social Worker  SUMMARY OF ONCOLOGIC HISTORY:   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 Possible early nodal disease in the right level 2 neck. Asymmetric fullness of the left tonsillar fossa, suspect squamous cell carcinoma primary.       06/28/2016 Pathology Results    Diagnosis Tonsil, biopsy, left - INVASIVE SQUAMOUS CELL CARCINOMA. - SEE COMMENT. Microscopic Comment The carcinoma appears moderately differentiated and is non-keratinizing. A p16 stain will be performed and the results reported separately The malignant cells are strongly and diffusely positive for p16.      07/05/2016 PET scan    1. Hypermetabolism in the region of the left palatini tonsil, consistent with known primary malignancy. 2. Hypermetabolic left-sided level II and III metastatic cervical lymphadenopathy. 3. 6 mm posterior left lower lobe pulmonary nodule. Attention on follow-up recommended as metastatic disease not excluded. 4. Cholelithiasis. 5. Nephrolithiasis. 6. Coronary artery and thoracoabdominal aortic atherosclerosis.      08/02/2016 Miscellaneous    Baseline hearing test is near normal      08/05/2016 Procedure    Fluoroscopic insertion  of a 20-French "pull-through" gastrostomy      08/05/2016 Procedure    Ultrasound and fluoroscopically guided right internal jugular single lumen power port catheter insertion. Tip in the SVC/RA junction. Catheter ready for use.      08/12/2016 -  Chemotherapy    Started chemo cisplatin       INTERVAL HISTORY: Please see below for problem oriented charting. He returns for further follow-up He tolerated treatment well Had some very minimum nausea Denies pain His appetite is stable but yet he has lost a lot of weight. He denies peripheral neuropathy or hearing problems  REVIEW OF SYSTEMS:   Constitutional: Denies fevers, chills Eyes: Denies blurriness of vision Ears, nose, mouth, throat, and face: Denies mucositis or sore throat Respiratory: Denies cough, dyspnea or wheezes Cardiovascular: Denies palpitation, chest discomfort or lower extremity swelling Skin: Denies abnormal skin rashes Lymphatics: Denies new lymphadenopathy or easy bruising Neurological:Denies numbness, tingling or new weaknesses Behavioral/Psych: Mood is stable, no new changes  All other systems were reviewed with the patient and are negative.  I have reviewed the past medical history, past surgical history, social history and family history with the patient and they are unchanged from previous note.  ALLERGIES:  has No Known Allergies.  MEDICATIONS:  Current Outpatient Prescriptions  Medication Sig Dispense Refill  . ondansetron (ZOFRAN) 8 MG tablet Take 1 tablet (8 mg total) by mouth 2 (two) times daily as needed. Start on the third day after chemotherapy. 30 tablet 1  . acetaminophen (TYLENOL) 325 MG tablet Take 650 mg by mouth every 6 (six) hours as needed. Pt states he takes 3 tabs when he takes    . HYDROcodone-acetaminophen (NORCO/VICODIN) 5-325 MG tablet Take 1 tablet  by mouth every 6 (six) hours as needed for moderate pain.    Marland Kitchen lidocaine-prilocaine (EMLA) cream Apply to affected area once (Patient  not taking: Reported on 08/18/2016) 30 g 3  . Melatonin 10 MG TABS Take by mouth.    . Multiple Vitamin (MULTIVITAMIN) tablet Take 1 tablet by mouth daily.    . Omega-3 Fatty Acids (FISH OIL OMEGA-3 PO) Take by mouth.    . prochlorperazine (COMPAZINE) 10 MG tablet Take 1 tablet (10 mg total) by mouth every 6 (six) hours as needed (Nausea or vomiting). (Patient not taking: Reported on 08/18/2016) 30 tablet 1  . VALERIAN ROOT PO Take by mouth.     No current facility-administered medications for this visit.     PHYSICAL EXAMINATION: ECOG PERFORMANCE STATUS: 1 - Symptomatic but completely ambulatory  Vitals:   08/18/16 1219  BP: 127/83  Pulse: 87  Resp: 18  Temp: 97.5 F (36.4 C)   Filed Weights   08/18/16 1219  Weight: 191 lb 11.2 oz (87 kg)    GENERAL:alert, no distress and comfortable SKIN: skin color, texture, turgor are normal, no rashes or significant lesions EYES: normal, Conjunctiva are pink and non-injected, sclera clear OROPHARYNX:no exudate, no erythema and lips, buccal mucosa, and tongue normal  NECK: supple, thyroid normal size, non-tender, without nodularity LYMPH: Noted large, persistent palpable lymphadenopathy on the left side of his neck LUNGS: clear to auscultation and percussion with normal breathing effort HEART: regular rate & rhythm and no murmurs and no lower extremity edema ABDOMEN:abdomen soft, non-tender and normal bowel sounds.  Feeding tube site looks okay Musculoskeletal:no cyanosis of digits and no clubbing  NEURO: alert & oriented x 3 with fluent speech, no focal motor/sensory deficits  LABORATORY DATA:  I have reviewed the data as listed    Component Value Date/Time   NA 135 (L) 08/17/2016 1431   K 4.0 08/17/2016 1431   CL 105 08/05/2016 1159   CO2 27 08/17/2016 1431   GLUCOSE 103 08/17/2016 1431   BUN 19.3 08/17/2016 1431   CREATININE 0.9 08/17/2016 1431   CALCIUM 8.8 08/17/2016 1431   PROT 6.9 08/17/2016 1431   ALBUMIN 3.2 (L)  08/17/2016 1431   AST 16 08/17/2016 1431   ALT 34 08/17/2016 1431   ALKPHOS 58 08/17/2016 1431   BILITOT 0.29 08/17/2016 1431   GFRNONAA >60 08/05/2016 1159   GFRAA >60 08/05/2016 1159    No results found for: SPEP, UPEP  Lab Results  Component Value Date   WBC 7.7 08/17/2016   NEUTROABS 6.3 08/17/2016   HGB 14.0 08/17/2016   HCT 42.1 08/17/2016   MCV 89.1 08/17/2016   PLT 370 08/17/2016      Chemistry      Component Value Date/Time   NA 135 (L) 08/17/2016 1431   K 4.0 08/17/2016 1431   CL 105 08/05/2016 1159   CO2 27 08/17/2016 1431   BUN 19.3 08/17/2016 1431   CREATININE 0.9 08/17/2016 1431      Component Value Date/Time   CALCIUM 8.8 08/17/2016 1431   ALKPHOS 58 08/17/2016 1431   AST 16 08/17/2016 1431   ALT 34 08/17/2016 1431   BILITOT 0.29 08/17/2016 1431       RADIOGRAPHIC STUDIES: I have personally reviewed the radiological images as listed and agreed with the findings in the report. Ir Gastrostomy Tube Mod Sed  Result Date: 08/05/2016 INDICATION: Tonsillar squamous cell carcinoma EXAM: FLUOROSCOPIC 20 FRENCH PULL-THROUGH GASTROSTOMY Date:  5/4/20185/07/2016 3:37 pm Radiologist:  M. Tomasita Crumble  Shick, MD Guidance:  Fluoroscopic MEDICATIONS: Ancef 2 g; Antibiotics were administered within 1 hour of the procedure. Glucagon 0.5 mg IV ANESTHESIA/SEDATION: Versed 1.0 mg IV; Fentanyl 25 mcg IV Moderate Sedation Time:  10 minutes The patient was continuously monitored during the procedure by the interventional radiology nurse under my direct supervision. CONTRAST:  48mL ISOVUE-300 IOPAMIDOL (ISOVUE-300) INJECTION 61% - administered into the gastric lumen. FLUOROSCOPY TIME:  Fluoroscopy Time: 1 minutes 30 seconds (21 mGy). COMPLICATIONS: None immediate. PROCEDURE: Informed consent was obtained from the patient following explanation of the procedure, risks, benefits and alternatives. The patient understands, agrees and consents for the procedure. All questions were addressed. A  time out was performed. Maximal barrier sterile technique utilized including caps, mask, sterile gowns, sterile gloves, large sterile drape, hand hygiene, and betadine prep. The left upper quadrant was sterilely prepped and draped. An oral gastric catheter was inserted into the stomach under fluoroscopy. The existing nasogastric feeding tube was removed. Air was injected into the stomach for insufflation and visualization under fluoroscopy. The air distended stomach was confirmed beneath the anterior abdominal wall in the frontal and lateral projections. Under sterile conditions and local anesthesia, a 18 gauge trocar needle was utilized to access the stomach percutaneously beneath the left subcostal margin. Needle position was confirmed within the stomach under biplane fluoroscopy. Contrast injection confirmed position also. A single T tack was deployed for gastropexy. Over an Amplatz guide wire, a 9-French sheath was inserted into the stomach. A snare device was utilized to capture the oral gastric catheter. The snare device was pulled retrograde from the stomach up the esophagus and out the oropharynx. The 20-French pull-through gastrostomy was connected to the snare device and pulled antegrade through the oropharynx down the esophagus into the stomach and then through the percutaneous tract external to the patient. The gastrostomy was assembled externally. Contrast injection confirms position in the stomach. Images were obtained for documentation. The patient tolerated procedure well. No immediate complication. IMPRESSION: Fluoroscopic insertion of a 20-French "pull-through" gastrostomy. Electronically Signed   By: Jerilynn Mages.  Shick M.D.   On: 08/05/2016 15:49   Ir US Guide Vasc Access Right  Result Date: 08/05/2016 CLINICAL DATA:  Squamous cell tonsillar carcinoma EXAM: RIGHT INTERNAL JUGULAR SINGLE LUMEN POWER PORT CATHETER INSERTION Date:  5/4/20185/07/2016 3:38 pm Radiologist:  M. Daryll Brod, MD Guidance:   Ultrasound and fluoroscopic MEDICATIONS: Ancef 2 g; The antibiotic was administered within an appropriate time interval prior to skin puncture. ANESTHESIA/SEDATION: Versed 4 mg IV; Fentanyl 75 mcg IV; Moderate Sedation Time:  35 minutes The patient was continuously monitored during the procedure by the interventional radiology nurse under my direct supervision. FLUOROSCOPY TIME:  24 seconds (4 mGy) COMPLICATIONS: None immediate. CONTRAST:  None. PROCEDURE: Informed consent was obtained from the patient following explanation of the procedure, risks, benefits and alternatives. The patient understands, agrees and consents for the procedure. All questions were addressed. A time out was performed. Maximal barrier sterile technique utilized including caps, mask, sterile gowns, sterile gloves, large sterile drape, hand hygiene, and 2% chlorhexidine scrub. Under sterile conditions and local anesthesia, right internal jugular micropuncture venous access was performed. Access was performed with ultrasound. Images were obtained for documentation. A guide wire was inserted followed by a transitional dilator. This allowed insertion of a guide wire and catheter into the IVC. Measurements were obtained from the SVC / RA junction back to the right IJ venotomy site. In the right infraclavicular chest, a subcutaneous pocket was created over the second anterior rib.  This was done under sterile conditions and local anesthesia. 1% lidocaine with epinephrine was utilized for this. A 2.5 cm incision was made in the skin. Blunt dissection was performed to create a subcutaneous pocket over the right pectoralis major muscle. The pocket was flushed with saline vigorously. There was adequate hemostasis. The port catheter was assembled and checked for leakage. The port catheter was secured in the pocket with two retention sutures. The tubing was tunneled subcutaneously to the right venotomy site and inserted into the SVC/RA junction through a  valved peel-away sheath. Position was confirmed with fluoroscopy. Images were obtained for documentation. The patient tolerated the procedure well. No immediate complications. Incisions were closed in a two layer fashion with 4 - 0 Vicryl suture. Dermabond was applied to the skin. The port catheter was accessed, blood was aspirated followed by saline and heparin flushes. Needle was removed. A dry sterile dressing was applied. IMPRESSION: Ultrasound and fluoroscopically guided right internal jugular single lumen power port catheter insertion. Tip in the SVC/RA junction. Catheter ready for use. Electronically Signed   By: Jerilynn Mages.  Shick M.D.   On: 08/05/2016 15:50   Ir Fluoro Guide Port Insertion Right  Result Date: 08/05/2016 CLINICAL DATA:  Squamous cell tonsillar carcinoma EXAM: RIGHT INTERNAL JUGULAR SINGLE LUMEN POWER PORT CATHETER INSERTION Date:  5/4/20185/07/2016 3:38 pm Radiologist:  M. Daryll Brod, MD Guidance:  Ultrasound and fluoroscopic MEDICATIONS: Ancef 2 g; The antibiotic was administered within an appropriate time interval prior to skin puncture. ANESTHESIA/SEDATION: Versed 4 mg IV; Fentanyl 75 mcg IV; Moderate Sedation Time:  35 minutes The patient was continuously monitored during the procedure by the interventional radiology nurse under my direct supervision. FLUOROSCOPY TIME:  24 seconds (4 mGy) COMPLICATIONS: None immediate. CONTRAST:  None. PROCEDURE: Informed consent was obtained from the patient following explanation of the procedure, risks, benefits and alternatives. The patient understands, agrees and consents for the procedure. All questions were addressed. A time out was performed. Maximal barrier sterile technique utilized including caps, mask, sterile gowns, sterile gloves, large sterile drape, hand hygiene, and 2% chlorhexidine scrub. Under sterile conditions and local anesthesia, right internal jugular micropuncture venous access was performed. Access was performed with ultrasound.  Images were obtained for documentation. A guide wire was inserted followed by a transitional dilator. This allowed insertion of a guide wire and catheter into the IVC. Measurements were obtained from the SVC / RA junction back to the right IJ venotomy site. In the right infraclavicular chest, a subcutaneous pocket was created over the second anterior rib. This was done under sterile conditions and local anesthesia. 1% lidocaine with epinephrine was utilized for this. A 2.5 cm incision was made in the skin. Blunt dissection was performed to create a subcutaneous pocket over the right pectoralis major muscle. The pocket was flushed with saline vigorously. There was adequate hemostasis. The port catheter was assembled and checked for leakage. The port catheter was secured in the pocket with two retention sutures. The tubing was tunneled subcutaneously to the right venotomy site and inserted into the SVC/RA junction through a valved peel-away sheath. Position was confirmed with fluoroscopy. Images were obtained for documentation. The patient tolerated the procedure well. No immediate complications. Incisions were closed in a two layer fashion with 4 - 0 Vicryl suture. Dermabond was applied to the skin. The port catheter was accessed, blood was aspirated followed by saline and heparin flushes. Needle was removed. A dry sterile dressing was applied. IMPRESSION: Ultrasound and fluoroscopically guided right internal jugular single  lumen power port catheter insertion. Tip in the SVC/RA junction. Catheter ready for use. Electronically Signed   By: Jerilynn Mages.  Shick M.D.   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   No orders of the defined types were placed in this encounter.  All questions were answered. The patient knows to call the clinic with any problems, questions or concerns. No barriers to learning was detected. I spent 15 minutes counseling the patient face to face. The total time spent in the appointment was 20 minutes and more than 50% was on counseling and review of test results     Heath Lark, MD 08/18/2016 1:11 PM

## 2016-08-18 NOTE — Assessment & Plan Note (Signed)
He has significant weight loss since treatment started We discussed importance of increasing oral intake as tolerated He will see dietitian on a weekly basis.

## 2016-08-18 NOTE — Progress Notes (Signed)
Nutrition follow-up completed with patient diagnosed with tonsil cancer. He is receiving concurrent chemoradiation therapy. Weight decreased and documented as 191.7 pounds on May 17 decreased from 199.5 pounds on May 8. Noted sodium 135 and albumin 3.2. Patient complains of dry mouth. He has nausea but states it is "bearable". Dietary recall reveals patient consumed about 750 cal yesterday.  States he has no appetite. He has not started drinking oral nutrition supplements or using his feeding tube. Patient appeared "down", voice was monotone, he would not look me in the eye.  Estimated nutrition needs 2400-2600 calories, 110-125 grams protein, 2.6 L fluid.  Nutrition diagnosis:  Predicted suboptimal energy intake has evolved into inadequate oral intake related to tonsil cancer and associated treatments as evidenced by 4% weight loss over 3 weeks.  Intervention: Educated patient on the importance of minimizing further weight loss. Recommended patient consume 3-4 Ensure Plus daily. Encouraged patient to try using his feeding tube over the weekend by instilling one bottle Osmolite 1.5 with 60 mL free water before and after bolus feeding. Encouraged patient to continue rinsing his mouth out with baking soda/saltwater Provided support and encouragement. Will refer patient to social worker for follow-up secondary to decreased mood.  Monitoring, evaluation, goals: Patient will increase oral intake to minimize further weight loss.  Next visit: Tuesday, May 22, after radiation therapy.  **Disclaimer: This note was dictated with voice recognition software. Similar sounding words can inadvertently be transcribed and this note may contain transcription errors which may not have been corrected upon publication of note.**

## 2016-08-18 NOTE — Assessment & Plan Note (Signed)
The neck mass is softer Continue close observation

## 2016-08-18 NOTE — Assessment & Plan Note (Signed)
So far, he tolerated treatment well except for profound weight loss. We discussed importance of increasing oral intake as tolerated I will continue to see him on a weekly basis for supportive care

## 2016-08-19 ENCOUNTER — Ambulatory Visit
Admission: RE | Admit: 2016-08-19 | Discharge: 2016-08-19 | Disposition: A | Discharge status: Home / Self Care | Payer: BLUE CROSS/BLUE SHIELD | Source: Ambulatory Visit | Attending: Radiation Oncology | Admitting: Radiation Oncology

## 2016-08-19 DIAGNOSIS — Z510 Encounter for antineoplastic radiation therapy: Secondary | ICD-10-CM | POA: Diagnosis not present

## 2016-08-22 ENCOUNTER — Ambulatory Visit
Admission: RE | Admit: 2016-08-22 | Discharge: 2016-08-22 | Disposition: A | Discharge status: Home / Self Care | Payer: BLUE CROSS/BLUE SHIELD | Source: Ambulatory Visit | Attending: Radiation Oncology | Admitting: Radiation Oncology

## 2016-08-22 ENCOUNTER — Encounter: Payer: Self-pay | Admitting: Radiation Oncology

## 2016-08-22 VITALS — Temp 98.3°F | Ht 69.0 in | Wt 189.2 lb

## 2016-08-22 DIAGNOSIS — C090 Malignant neoplasm of tonsillar fossa: Secondary | ICD-10-CM

## 2016-08-22 DIAGNOSIS — Z510 Encounter for antineoplastic radiation therapy: Secondary | ICD-10-CM | POA: Diagnosis not present

## 2016-08-22 MED ORDER — LIDOCAINE VISCOUS 2 % MT SOLN: OROMUCOSAL | 5 refills | Status: DC

## 2016-08-22 NOTE — Progress Notes (Signed)
Douglas Norris is here for his 9th fraction of radiation to his head and neck, left tonsil. He reports throat pain a 4/10 which increases when he swallows. He is taking tylenol for relief before he eats. He is trying to eat, but he needs to chew often to swallow. He is frustrated because he is unable to eat well enough to match his caloric needs. He is instilling one can of a nutritional supplement daily. He is drinking water daily. The skin to his neck is intact and he is using biafine twice daily.   Temp 98.3 F (36.8 C)   Ht 5\' 9"  (1.753 m)   Wt 189 lb 3.2 oz (85.8 kg)   SpO2 98% Comment: room air  BMI 27.94 kg/m    Orthostatics: BP sitting 132/94 pulse 98, BP standing 124/99 pulse 96.   08/16/16 194.8 lb 08/22/16 189.2 lb

## 2016-08-23 ENCOUNTER — Ambulatory Visit
Admission: RE | Admit: 2016-08-23 | Discharge: 2016-08-23 | Disposition: A | Discharge status: Home / Self Care | Payer: BLUE CROSS/BLUE SHIELD | Source: Ambulatory Visit | Attending: Radiation Oncology | Admitting: Radiation Oncology

## 2016-08-23 ENCOUNTER — Ambulatory Visit: Payer: BLUE CROSS/BLUE SHIELD | Admitting: Nutrition

## 2016-08-23 DIAGNOSIS — Z510 Encounter for antineoplastic radiation therapy: Secondary | ICD-10-CM | POA: Diagnosis not present

## 2016-08-23 NOTE — Progress Notes (Signed)
Nutrition follow-up completed with patient diagnosed with tonsil cancer. He is receiving concurrent chemoradiation therapy. Weight decreased and documented as 189 pounds on May 24, down from 191.7 pounds may 17. Patient reports nausea is better this week. He reports increasing difficulty with swallowing but is tolerating pudding and a little bit of ensure. He has used his feeding tube without difficulty but is not putting any consistent amount in his tube.  Estimated nutrition needs: 2400-2600 calories, 100 110-125 g protein, 2.6 L fluid.  Nutrition diagnosis: Inadequate oral intake continues.  Intervention: Educated patient on the importance of beginning tube feeding and Ensure Plus by mouth as his main source nutrition. Encouraged patient to continue to try soft moist foods as tolerated as well as water by mouth. Provided patient with scheduled for tube feeding as follows: Patient will begin one bottle Osmolite 1.5 4 times a day and increase to a bottle and a half 4 times a day as tolerated. He will flush feeding tube with 60 mL free water before and after his feedings. Encouraged patient to continue oral intake to provide additional calories and protein. When he is no longer able to eat orally, patient will need to increase Osmolite 1.5-7 bottles daily. This provides 2485 kcal, 104 grams protein, and 2707 mL free water. In addition patient requires additional 32 ounces of water by mouth or through PEG. Schedule was written out for patient.  Questions were answered.  Teach back method used.   Provided patient with complementary case of Ensure Plus as well as samples of Osmolite 1.5.  Monitoring, evaluation, goals: Patient will tolerate tube feeding plus oral intake to minimize further weight loss.  Next visit: Thursday, May 31.  After radiation therapy.  **Disclaimer: This note was dictated with voice recognition software. Similar sounding words can inadvertently be transcribed and  this note may contain transcription errors which may not have been corrected upon publication of note.**

## 2016-08-23 NOTE — Progress Notes (Signed)
   Weekly Management Note:  outpatient    ICD-9-CM ICD-10-CM   1. Cancer of tonsillar fossa ([facility]CC) 146.1 C09.0 lidocaine (XYLOCAINE) 2 % solution    Current Dose:  18 Gy  Projected Dose: 70 Gy   Narrative:  The patient presents for routine under treatment assessment.  CBCT/MVCT images/Port film x-rays were reviewed.  The chart was checked. Doing well overall, but lost 5 lbs.  Mild throat maid.    Physical Findings:  Wt Readings from Last 3 Encounters:  08/22/16 189 lb 3.2 oz (85.8 kg)  08/18/16 191 lb 11.2 oz (87 kg)  08/09/16 199 lb 8 oz (90.5 kg)    height is 5\' 9"  (1.753 m) and weight is 189 lb 3.2 oz (85.8 kg). His temperature is 98.3 F (36.8 C). His oxygen saturation is 98%.  NAD, no mucositis in oropharynx, no skin irritation over neck  CBC    Component Value Date/Time   WBC 7.7 08/17/2016 1431   WBC 8.7 08/05/2016 1159   RBC 4.72 08/17/2016 1431   RBC 4.88 08/05/2016 1159   HGB 14.0 08/17/2016 1431   HCT 42.1 08/17/2016 1431   PLT 370 08/17/2016 1431   MCV 89.1 08/17/2016 1431   MCH 29.7 08/17/2016 1431   MCH 30.1 08/05/2016 1159   MCHC 33.4 08/17/2016 1431   MCHC 33.7 08/05/2016 1159   RDW 13.2 08/17/2016 1431   LYMPHSABS 0.7 (L) 08/17/2016 1431   MONOABS 0.6 08/17/2016 1431   EOSABS 0.1 08/17/2016 1431   BASOSABS 0.0 08/17/2016 1431     CMP     Component Value Date/Time   NA 135 (L) 08/17/2016 1431   K 4.0 08/17/2016 1431   CL 105 08/05/2016 1159   CO2 27 08/17/2016 1431   GLUCOSE 103 08/17/2016 1431   BUN 19.3 08/17/2016 1431   CREATININE 0.9 08/17/2016 1431   CALCIUM 8.8 08/17/2016 1431   PROT 6.9 08/17/2016 1431   ALBUMIN 3.2 (L) 08/17/2016 1431   AST 16 08/17/2016 1431   ALT 34 08/17/2016 1431   ALKPHOS 58 08/17/2016 1431   BILITOT 0.29 08/17/2016 1431   GFRNONAA >60 08/05/2016 1159   GFRAA >60 08/05/2016 1159     Impression:  The patient is tolerating radiotherapy.   Plan:  Continue radiotherapy as planned.  Lidocaine Rx today for sore  throat.  Push intake, use PEG as needed.  -----------------------------------  Eppie Gibson, MD

## 2016-08-24 ENCOUNTER — Ambulatory Visit
Admission: RE | Admit: 2016-08-24 | Discharge: 2016-08-24 | Disposition: A | Discharge status: Home / Self Care | Payer: BLUE CROSS/BLUE SHIELD | Source: Ambulatory Visit | Attending: Radiation Oncology | Admitting: Radiation Oncology

## 2016-08-24 ENCOUNTER — Other Ambulatory Visit (HOSPITAL_BASED_OUTPATIENT_CLINIC_OR_DEPARTMENT_OTHER): Payer: BLUE CROSS/BLUE SHIELD

## 2016-08-24 DIAGNOSIS — C090 Malignant neoplasm of tonsillar fossa: Secondary | ICD-10-CM

## 2016-08-24 DIAGNOSIS — Z510 Encounter for antineoplastic radiation therapy: Secondary | ICD-10-CM | POA: Diagnosis not present

## 2016-08-24 LAB — CBC WITH DIFFERENTIAL/PLATELET
BASO%: 0.4 % (ref 0.0–2.0)
Basophils Absolute: 0 10*3/uL (ref 0.0–0.1)
EOS%: 0.4 % (ref 0.0–7.0)
Eosinophils Absolute: 0 10*3/uL (ref 0.0–0.5)
HCT: 41.6 % (ref 38.4–49.9)
HGB: 13.9 g/dL (ref 13.0–17.1)
LYMPH%: 6.5 % — AB (ref 14.0–49.0)
MCH: 30 pg (ref 27.2–33.4)
MCHC: 33.4 g/dL (ref 32.0–36.0)
MCV: 89.8 fL (ref 79.3–98.0)
MONO#: 1.1 10*3/uL — ABNORMAL HIGH (ref 0.1–0.9)
MONO%: 15.5 % — ABNORMAL HIGH (ref 0.0–14.0)
NEUT#: 5.3 10*3/uL (ref 1.5–6.5)
NEUT%: 77.2 % — AB (ref 39.0–75.0)
Platelets: 229 10*3/uL (ref 140–400)
RBC: 4.63 10*6/uL (ref 4.20–5.82)
RDW: 12.9 % (ref 11.0–14.6)
WBC: 6.8 10*3/uL (ref 4.0–10.3)
lymph#: 0.4 10*3/uL — ABNORMAL LOW (ref 0.9–3.3)

## 2016-08-24 LAB — COMPREHENSIVE METABOLIC PANEL
ALK PHOS: 52 U/L (ref 40–150)
ALT: 21 U/L (ref 0–55)
AST: 13 U/L (ref 5–34)
Albumin: 3.5 g/dL (ref 3.5–5.0)
Anion Gap: 12 mEq/L — ABNORMAL HIGH (ref 3–11)
BILIRUBIN TOTAL: 0.27 mg/dL (ref 0.20–1.20)
BUN: 24.4 mg/dL (ref 7.0–26.0)
CALCIUM: 9.6 mg/dL (ref 8.4–10.4)
CO2: 28 mEq/L (ref 22–29)
Chloride: 99 mEq/L (ref 98–109)
Creatinine: 0.9 mg/dL (ref 0.7–1.3)
EGFR: >90 mL/min/{1.73_m2} (ref >90)
Glucose: 96 mg/dl (ref 70–140)
POTASSIUM: 4.3 meq/L (ref 3.5–5.1)
Sodium: 139 mEq/L (ref 136–145)
Total Protein: 7.1 g/dL (ref 6.4–8.3)

## 2016-08-24 LAB — MAGNESIUM: MAGNESIUM: 2.2 mg/dL (ref 1.5–2.5)

## 2016-08-25 ENCOUNTER — Ambulatory Visit
Admission: RE | Admit: 2016-08-25 | Discharge: 2016-08-25 | Disposition: A | Discharge status: Home / Self Care | Payer: BLUE CROSS/BLUE SHIELD | Source: Ambulatory Visit | Attending: Radiation Oncology | Admitting: Radiation Oncology

## 2016-08-25 ENCOUNTER — Encounter: Payer: Self-pay | Admitting: Hematology and Oncology

## 2016-08-25 ENCOUNTER — Ambulatory Visit (HOSPITAL_BASED_OUTPATIENT_CLINIC_OR_DEPARTMENT_OTHER): Payer: BLUE CROSS/BLUE SHIELD | Admitting: Hematology and Oncology

## 2016-08-25 DIAGNOSIS — C090 Malignant neoplasm of tonsillar fossa: Secondary | ICD-10-CM | POA: Diagnosis not present

## 2016-08-25 DIAGNOSIS — R634 Abnormal weight loss: Secondary | ICD-10-CM | POA: Diagnosis not present

## 2016-08-25 DIAGNOSIS — C770 Secondary and unspecified malignant neoplasm of lymph nodes of head, face and neck: Secondary | ICD-10-CM

## 2016-08-25 DIAGNOSIS — K1231 Oral mucositis (ulcerative) due to antineoplastic therapy: Secondary | ICD-10-CM | POA: Insufficient documentation

## 2016-08-25 DIAGNOSIS — Z510 Encounter for antineoplastic radiation therapy: Secondary | ICD-10-CM | POA: Diagnosis not present

## 2016-08-25 MED ORDER — FENTANYL 25 MCG/HR TD PT72: 25.0000 ug | MEDICATED_PATCH | TRANSDERMAL | 0 refills | Status: DC

## 2016-08-25 MED ORDER — MORPHINE SULFATE (CONCENTRATE) 10 MG /0.5 ML PO SOLN: 10.0000 mg | ORAL | 0 refills | Status: DC | PRN

## 2016-08-25 MED FILL — fentaNYL 25 MCG/HR PT72: 25 | 15 days supply | Qty: 5 | Fill #0

## 2016-08-25 MED FILL — MORPHINE SULF 100 MG/5 ML S: 100 | 30 days supply | Qty: 180 | Fill #0

## 2016-08-25 NOTE — Assessment & Plan Note (Signed)
The neck mass is softer Continue close observation

## 2016-08-25 NOTE — Assessment & Plan Note (Signed)
So far, he tolerated treatment well except for recent weight loss and worsening mucositis. We discussed importance of increasing oral intake as tolerated I also recommend he start taking fentanyl patch Oral liquid morphine sulfate as needed for pain control I will continue to see him on a weekly basis for supportive care and will reassess pain control next week

## 2016-08-25 NOTE — Assessment & Plan Note (Signed)
He has recent eight loss since treatment started We discussed importance of increasing oral intake as tolerated He will see dietitian on a weekly basis.

## 2016-08-25 NOTE — Progress Notes (Signed)
[facility] OFFICE PROGRESS NOTE  Patient Care Team: Patient, No Pcp Per as PCP - General (General Practice) Rozetta Nunnery, MD as Consulting Physician (Otolaryngology) Eppie Gibson, MD as Attending Physician (Radiation Oncology) Heath Lark, MD as Consulting Physician (Hematology and Oncology) Lenn Cal, DDS as Consulting Physician (Dentistry) Leota Sauers, RN as Oncology Nurse Navigator Karie Mainland, RD as Dietitian (Nutrition) Valentino Saxon Perry [place], CCC-SLP as Speech Language Pathologist (Speech Pathology) Jomarie Longs, PT as Physical Therapist (Physical Therapy) Kennith Center, LCSW as Social Worker  SUMMARY OF ONCOLOGIC HISTORY:   Cancer of tonsillar fossa ([place])   06/27/2016 Imaging    Ct neck: Multiple malignant appearing lymph nodes in the left cervical chain as described, up to 23 mm in diameter. Possible early nodal disease in the right level 2 neck. Asymmetric fullness of the left tonsillar fossa, suspect squamous cell carcinoma primary.       06/28/2016 Pathology Results    Diagnosis Tonsil, biopsy, left - INVASIVE SQUAMOUS CELL CARCINOMA. - SEE COMMENT. Microscopic Comment The carcinoma appears moderately differentiated and is non-keratinizing. A p16 stain will be performed and the results reported separately The malignant cells are strongly and diffusely positive for p16.      07/05/2016 PET scan    1. Hypermetabolism in the region of the left palatini tonsil, consistent with known primary malignancy. 2. Hypermetabolic left-sided level II and III metastatic cervical lymphadenopathy. 3. 6 mm posterior left lower lobe pulmonary nodule. Attention on follow-up recommended as metastatic disease not excluded. 4. Cholelithiasis. 5. Nephrolithiasis. 6. Coronary artery and thoracoabdominal aortic atherosclerosis.      08/02/2016 Miscellaneous    Baseline hearing test is near normal      08/05/2016 Procedure    Fluoroscopic insertion  of a 20-French "pull-through" gastrostomy      08/05/2016 Procedure    Ultrasound and fluoroscopically guided right internal jugular single lumen power port catheter insertion. Tip in the SVC/RA junction. Catheter ready for use.      08/12/2016 -  Chemotherapy    Started chemo cisplatin       INTERVAL HISTORY: Please see below for problem oriented charting. He returns for further follow-up and for his weekly supportive care visit He started to experience worsening pain and sore throat He is able to call and try to increase oral intake as tolerated He has not lost weight this week He denies nausea  REVIEW OF SYSTEMS:   Constitutional: Denies fevers, chills or abnormal weight loss Eyes: Denies blurriness of vision Respiratory: Denies cough, dyspnea or wheezes Cardiovascular: Denies palpitation, chest discomfort or lower extremity swelling Gastrointestinal:  Denies nausea, heartburn or change in bowel habits Skin: Denies abnormal skin rashes Lymphatics: Denies new lymphadenopathy or easy bruising Neurological:Denies numbness, tingling or new weaknesses Behavioral/Psych: Mood is stable, no new changes  All other systems were reviewed with the patient and are negative.  I have reviewed the past medical history, past surgical history, social history and family history with the patient and they are unchanged from previous note.  ALLERGIES:  has No Known Allergies.  MEDICATIONS:  Current Outpatient Prescriptions  Medication Sig Dispense Refill  . acetaminophen (TYLENOL) 325 MG tablet Take 650 mg by mouth every 6 (six) hours as needed. Pt states he takes 3 tabs when he takes    . fentaNYL (DURAGESIC - DOSED MCG/HR) 25 MCG/HR patch Place 1 patch (25 mcg total) onto the skin every 3 (three) days. 5 patch 0  . HYDROcodone-acetaminophen (NORCO/VICODIN)  5-325 MG tablet Take 1 tablet by mouth every 6 (six) hours as needed for moderate pain.    Marland Kitchen lidocaine (XYLOCAINE) 2 % solution Patient:  Mix 1part 2% viscous lidocaine, 1part H20. Swish & swallow 6mL of diluted mixture, 15min before meals and at bedtime, up to QID 100 mL 5  . lidocaine-prilocaine (EMLA) cream Apply to affected area once (Patient not taking: Reported on 08/18/2016) 30 g 3  . Melatonin 10 MG TABS Take by mouth.    . Morphine Sulfate (MORPHINE CONCENTRATE) 10 mg / 0.5 ml concentrated solution Take 0.5 mLs (10 mg total) by mouth every 2 (two) hours as needed for severe pain. 240 mL 0  . Multiple Vitamin (MULTIVITAMIN) tablet Take 1 tablet by mouth daily.    . Omega-3 Fatty Acids (FISH OIL OMEGA-3 PO) Take by mouth.    . ondansetron (ZOFRAN) 8 MG tablet Take 1 tablet (8 mg total) by mouth 2 (two) times daily as needed. Start on the third day after chemotherapy. 30 tablet 1  . prochlorperazine (COMPAZINE) 10 MG tablet Take 1 tablet (10 mg total) by mouth every 6 (six) hours as needed (Nausea or vomiting). (Patient not taking: Reported on 08/18/2016) 30 tablet 1  . VALERIAN ROOT PO Take by mouth.     No current facility-administered medications for this visit.     PHYSICAL EXAMINATION: ECOG PERFORMANCE STATUS: 1 - Symptomatic but completely ambulatory  Vitals:   08/25/16 1237  BP: 117/81  Pulse: 86  Resp: 18  Temp: 99 F (37.2 C)   Filed Weights   08/25/16 1237  Weight: 191 lb 8 oz (86.9 kg)    GENERAL:alert, no distress and comfortable SKIN: Noted mild dermatitis around his neck  EYES: normal, Conjunctiva are pink and non-injected, sclera clear OROPHARYNX: Noted some mild mucositis change.  No bleeding NECK: supple, thyroid normal size, non-tender, without nodularity LYMPH: He has palpable lymphadenopathy in the neck, stable LUNGS: clear to auscultation and percussion with normal breathing effort HEART: regular rate & rhythm and no murmurs and no lower extremity edema ABDOMEN:abdomen soft, non-tender and normal bowel sounds Musculoskeletal:no cyanosis of digits and no clubbing  NEURO: alert & oriented  x 3 with fluent speech, no focal motor/sensory deficits  LABORATORY DATA:  I have reviewed the data as listed    Component Value Date/Time   NA 139 08/24/2016 1418   K 4.3 08/24/2016 1418   CL 105 08/05/2016 1159   CO2 28 08/24/2016 1418   GLUCOSE 96 08/24/2016 1418   BUN 24.4 08/24/2016 1418   CREATININE 0.9 08/24/2016 1418   CALCIUM 9.6 08/24/2016 1418   PROT 7.1 08/24/2016 1418   ALBUMIN 3.5 08/24/2016 1418   AST 13 08/24/2016 1418   ALT 21 08/24/2016 1418   ALKPHOS 52 08/24/2016 1418   BILITOT 0.27 08/24/2016 1418   GFRNONAA >60 08/05/2016 1159   GFRAA >60 08/05/2016 1159    No results found for: SPEP, UPEP  Lab Results  Component Value Date   WBC 6.8 08/24/2016   NEUTROABS 5.3 08/24/2016   HGB 13.9 08/24/2016   HCT 41.6 08/24/2016   MCV 89.8 08/24/2016   PLT 229 08/24/2016      Chemistry      Component Value Date/Time   NA 139 08/24/2016 1418   K 4.3 08/24/2016 1418   CL 105 08/05/2016 1159   CO2 28 08/24/2016 1418   BUN 24.4 08/24/2016 1418   CREATININE 0.9 08/24/2016 1418      Component Value Date/Time  CALCIUM 9.6 08/24/2016 1418   ALKPHOS 52 08/24/2016 1418   AST 13 08/24/2016 1418   ALT 21 08/24/2016 1418   BILITOT 0.27 08/24/2016 1418       RADIOGRAPHIC STUDIES: I have personally reviewed the radiological images as listed and agreed with the findings in the report. Ir Gastrostomy Tube Mod Sed  Result Date: 08/05/2016 INDICATION: Tonsillar squamous cell carcinoma EXAM: FLUOROSCOPIC 20 FRENCH PULL-THROUGH GASTROSTOMY Date:  5/4/20185/07/2016 3:37 pm Radiologist:  M. Daryll Brod, MD Guidance:  Fluoroscopic MEDICATIONS: Ancef 2 g; Antibiotics were administered within 1 hour of the procedure. Glucagon 0.5 mg IV ANESTHESIA/SEDATION: Versed 1.0 mg IV; Fentanyl 25 mcg IV Moderate Sedation Time:  10 minutes The patient was continuously monitored during the procedure by the interventional radiology nurse under my direct supervision. CONTRAST:  47mL  ISOVUE-300 IOPAMIDOL (ISOVUE-300) INJECTION 61% - administered into the gastric lumen. FLUOROSCOPY TIME:  Fluoroscopy Time: 1 minutes 30 seconds (21 mGy). COMPLICATIONS: None immediate. PROCEDURE: Informed consent was obtained from the patient following explanation of the procedure, risks, benefits and alternatives. The patient understands, agrees and consents for the procedure. All questions were addressed. A time out was performed. Maximal barrier sterile technique utilized including caps, mask, sterile gowns, sterile gloves, large sterile drape, hand hygiene, and betadine prep. The left upper quadrant was sterilely prepped and draped. An oral gastric catheter was inserted into the stomach under fluoroscopy. The existing nasogastric feeding tube was removed. Air was injected into the stomach for insufflation and visualization under fluoroscopy. The air distended stomach was confirmed beneath the anterior abdominal wall in the frontal and lateral projections. Under sterile conditions and local anesthesia, a 70 gauge trocar needle was utilized to access the stomach percutaneously beneath the left subcostal margin. Needle position was confirmed within the stomach under biplane fluoroscopy. Contrast injection confirmed position also. A single T tack was deployed for gastropexy. Over an Amplatz guide wire, a 9-French sheath was inserted into the stomach. A snare device was utilized to capture the oral gastric catheter. The snare device was pulled retrograde from the stomach up the esophagus and out the oropharynx. The 20-French pull-through gastrostomy was connected to the snare device and pulled antegrade through the oropharynx down the esophagus into the stomach and then through the percutaneous tract external to the patient. The gastrostomy was assembled externally. Contrast injection confirms position in the stomach. Images were obtained for documentation. The patient tolerated procedure well. No immediate  complication. IMPRESSION: Fluoroscopic insertion of a 20-French "pull-through" gastrostomy. Electronically Signed   By: Jerilynn Mages.  Shick M.D.   On: 08/05/2016 15:49   Ir US Guide Vasc Access Right  Result Date: 08/05/2016 CLINICAL DATA:  Squamous cell tonsillar carcinoma EXAM: RIGHT INTERNAL JUGULAR SINGLE LUMEN POWER PORT CATHETER INSERTION Date:  5/4/20185/07/2016 3:38 pm Radiologist:  M. Daryll Brod, MD Guidance:  Ultrasound and fluoroscopic MEDICATIONS: Ancef 2 g; The antibiotic was administered within an appropriate time interval prior to skin puncture. ANESTHESIA/SEDATION: Versed 4 mg IV; Fentanyl 75 mcg IV; Moderate Sedation Time:  35 minutes The patient was continuously monitored during the procedure by the interventional radiology nurse under my direct supervision. FLUOROSCOPY TIME:  24 seconds (4 mGy) COMPLICATIONS: None immediate. CONTRAST:  None. PROCEDURE: Informed consent was obtained from the patient following explanation of the procedure, risks, benefits and alternatives. The patient understands, agrees and consents for the procedure. All questions were addressed. A time out was performed. Maximal barrier sterile technique utilized including caps, mask, sterile gowns, sterile gloves, large sterile drape, hand  hygiene, and 2% chlorhexidine scrub. Under sterile conditions and local anesthesia, right internal jugular micropuncture venous access was performed. Access was performed with ultrasound. Images were obtained for documentation. A guide wire was inserted followed by a transitional dilator. This allowed insertion of a guide wire and catheter into the IVC. Measurements were obtained from the SVC / RA junction back to the right IJ venotomy site. In the right infraclavicular chest, a subcutaneous pocket was created over the second anterior rib. This was done under sterile conditions and local anesthesia. 1% lidocaine with epinephrine was utilized for this. A 2.5 cm incision was made in the skin. Blunt  dissection was performed to create a subcutaneous pocket over the right pectoralis major muscle. The pocket was flushed with saline vigorously. There was adequate hemostasis. The port catheter was assembled and checked for leakage. The port catheter was secured in the pocket with two retention sutures. The tubing was tunneled subcutaneously to the right venotomy site and inserted into the SVC/RA junction through a valved peel-away sheath. Position was confirmed with fluoroscopy. Images were obtained for documentation. The patient tolerated the procedure well. No immediate complications. Incisions were closed in a two layer fashion with 4 - 0 Vicryl suture. Dermabond was applied to the skin. The port catheter was accessed, blood was aspirated followed by saline and heparin flushes. Needle was removed. A dry sterile dressing was applied. IMPRESSION: Ultrasound and fluoroscopically guided right internal jugular single lumen power port catheter insertion. Tip in the SVC/RA junction. Catheter ready for use. Electronically Signed   By: Jerilynn Mages.  Shick M.D.   On: 08/05/2016 15:50   Ir Fluoro Guide Port Insertion Right  Result Date: 08/05/2016 CLINICAL DATA:  Squamous cell tonsillar carcinoma EXAM: RIGHT INTERNAL JUGULAR SINGLE LUMEN POWER PORT CATHETER INSERTION Date:  5/4/20185/07/2016 3:38 pm Radiologist:  M. Daryll Brod, MD Guidance:  Ultrasound and fluoroscopic MEDICATIONS: Ancef 2 g; The antibiotic was administered within an appropriate time interval prior to skin puncture. ANESTHESIA/SEDATION: Versed 4 mg IV; Fentanyl 75 mcg IV; Moderate Sedation Time:  35 minutes The patient was continuously monitored during the procedure by the interventional radiology nurse under my direct supervision. FLUOROSCOPY TIME:  24 seconds (4 mGy) COMPLICATIONS: None immediate. CONTRAST:  None. PROCEDURE: Informed consent was obtained from the patient following explanation of the procedure, risks, benefits and alternatives. The patient  understands, agrees and consents for the procedure. All questions were addressed. A time out was performed. Maximal barrier sterile technique utilized including caps, mask, sterile gowns, sterile gloves, large sterile drape, hand hygiene, and 2% chlorhexidine scrub. Under sterile conditions and local anesthesia, right internal jugular micropuncture venous access was performed. Access was performed with ultrasound. Images were obtained for documentation. A guide wire was inserted followed by a transitional dilator. This allowed insertion of a guide wire and catheter into the IVC. Measurements were obtained from the SVC / RA junction back to the right IJ venotomy site. In the right infraclavicular chest, a subcutaneous pocket was created over the second anterior rib. This was done under sterile conditions and local anesthesia. 1% lidocaine with epinephrine was utilized for this. A 2.5 cm incision was made in the skin. Blunt dissection was performed to create a subcutaneous pocket over the right pectoralis major muscle. The pocket was flushed with saline vigorously. There was adequate hemostasis. The port catheter was assembled and checked for leakage. The port catheter was secured in the pocket with two retention sutures. The tubing was tunneled subcutaneously to the right venotomy site  and inserted into the SVC/RA junction through a valved peel-away sheath. Position was confirmed with fluoroscopy. Images were obtained for documentation. The patient tolerated the procedure well. No immediate complications. Incisions were closed in a two layer fashion with 4 - 0 Vicryl suture. Dermabond was applied to the skin. The port catheter was accessed, blood was aspirated followed by saline and heparin flushes. Needle was removed. A dry sterile dressing was applied. IMPRESSION: Ultrasound and fluoroscopically guided right internal jugular single lumen power port catheter insertion. Tip in the SVC/RA junction. Catheter ready for  use. Electronically Signed   By: Jerilynn Mages.  Shick M.D.   On: 08/05/2016 15:50    ASSESSMENT & PLAN:  Cancer of tonsillar fossa ([facility]CC) So far, he tolerated treatment well except for recent weight loss and worsening mucositis. 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  Mucositis due to antineoplastic therapy The patient had significant mucositis pain We discussed pain management We discussed narcotic refill policy I warned her about risk of sedation, nausea and constipation with treatment.   No orders of the defined types were placed in this encounter.  All questions were answered. The patient knows to call the clinic with any problems, questions or concerns. No barriers to learning was detected. I spent 15 minutes counseling the patient face to face. The total time spent in the appointment was 20 minutes and more than 50% was on counseling and review of test results     Heath Lark, MD 08/25/2016 2:35 PM

## 2016-08-25 NOTE — Assessment & Plan Note (Signed)
The patient had significant mucositis pain We discussed pain management We discussed narcotic refill policy I warned her about risk of sedation, nausea and constipation with treatment.

## 2016-08-26 ENCOUNTER — Ambulatory Visit
Admission: RE | Admit: 2016-08-26 | Discharge: 2016-08-26 | Disposition: A | Discharge status: Home / Self Care | Payer: BLUE CROSS/BLUE SHIELD | Source: Ambulatory Visit | Attending: Radiation Oncology | Admitting: Radiation Oncology

## 2016-08-26 DIAGNOSIS — Z510 Encounter for antineoplastic radiation therapy: Secondary | ICD-10-CM | POA: Diagnosis not present

## 2016-08-30 ENCOUNTER — Encounter: Payer: Self-pay | Admitting: Radiation Oncology

## 2016-08-30 ENCOUNTER — Ambulatory Visit
Admission: RE | Admit: 2016-08-30 | Discharge: 2016-08-30 | Disposition: A | Discharge status: Home / Self Care | Payer: BLUE CROSS/BLUE SHIELD | Source: Ambulatory Visit | Attending: Radiation Oncology | Admitting: Radiation Oncology

## 2016-08-30 VITALS — BP 138/96 | HR 93 | Temp 98.3°F | Ht 69.0 in | Wt 190.0 lb

## 2016-08-30 DIAGNOSIS — C090 Malignant neoplasm of tonsillar fossa: Secondary | ICD-10-CM

## 2016-08-30 DIAGNOSIS — Z510 Encounter for antineoplastic radiation therapy: Secondary | ICD-10-CM | POA: Diagnosis not present

## 2016-08-30 NOTE — Progress Notes (Signed)
Mr. [facility] presents for his 14th fraction of radiation to his Left Tonsil. He denies pain. He has burning to the left side of his tongue. He has some fatigue. He is eating food orally. He is instilling 4 cans of osmolite daily. He is drinking and instilling about 32 ounces of water daily. He reports thick saliva, which causes nausea at times. His neck is slightly red, and he is using biafine twice daily.   BP (!) 138/96   Pulse 93   Temp 98.3 F (36.8 C)   Ht 5\' 9"  (1.753 m)   Wt 190 lb (86.2 kg)   SpO2 99% Comment: rroom air  BMI 28.06 kg/m    Wt Readings from Last 3 Encounters:  08/30/16 190 lb (86.2 kg)  08/25/16 191 lb 8 oz (86.9 kg)  08/22/16 189 lb 3.2 oz (85.8 kg)

## 2016-08-30 NOTE — Progress Notes (Signed)
   Weekly Management Note:  outpatient    ICD-9-CM ICD-10-CM   1. Cancer of tonsillar fossa (HCC) 146.1 C09.0     Current Dose:  28 Gy  Projected Dose: 70 Gy   Narrative:  The patient presents for routine under treatment assessment.  CBCT/MVCT images/Port film x-rays were reviewed.  The chart was checked.   The patient presents today for his 14th fraction of radiation to the left tonsil. He denies pain, though he reports burning to the left side of his tongue. He is eating food orally. He instills 4 cans of Osmolite daily. He drinks and/or instills at least 32 ounces of water daily. He reports thick saliva, which causes nausea at times. He is using Biafine twice daily.   Physical Findings:  Wt Readings from Last 3 Encounters:  08/30/16 190 lb (86.2 kg)  08/25/16 191 lb 8 oz (86.9 kg)  08/22/16 189 lb 3.2 oz (85.8 kg)    height is 5\' 9"  (1.753 m) and weight is 190 lb (86.2 kg). His temperature is 98.3 F (36.8 C). His blood pressure is 138/96 (abnormal) and his pulse is 93. His oxygen saturation is 99%.   Patchy mucositis with erythema in the throat. Mucous membranes moist. Skin slightly erythematous but intact. Left upper neck mass is shrinking.   CBC    Component Value Date/Time   WBC 6.8 08/24/2016 1418   WBC 8.7 08/05/2016 1159   RBC 4.63 08/24/2016 1418   RBC 4.88 08/05/2016 1159   HGB 13.9 08/24/2016 1418   HCT 41.6 08/24/2016 1418   PLT 229 08/24/2016 1418   MCV 89.8 08/24/2016 1418   MCH 30.0 08/24/2016 1418   MCH 30.1 08/05/2016 1159   MCHC 33.4 08/24/2016 1418   MCHC 33.7 08/05/2016 1159   RDW 12.9 08/24/2016 1418   LYMPHSABS 0.4 (L) 08/24/2016 1418   MONOABS 1.1 (H) 08/24/2016 1418   EOSABS 0.0 08/24/2016 1418   BASOSABS 0.0 08/24/2016 1418     CMP     Component Value Date/Time   NA 139 08/24/2016 1418   K 4.3 08/24/2016 1418   CL 105 08/05/2016 1159   CO2 28 08/24/2016 1418   GLUCOSE 96 08/24/2016 1418   BUN 24.4 08/24/2016 1418   CREATININE 0.9  08/24/2016 1418   CALCIUM 9.6 08/24/2016 1418   PROT 7.1 08/24/2016 1418   ALBUMIN 3.5 08/24/2016 1418   AST 13 08/24/2016 1418   ALT 21 08/24/2016 1418   ALKPHOS 52 08/24/2016 1418   BILITOT 0.27 08/24/2016 1418   GFRNONAA >60 08/05/2016 1159   GFRAA >60 08/05/2016 1159     Impression:  The patient is tolerating radiotherapy.   Plan:  Continue radiotherapy as planned. Patient may drink diet ginger ale or papaya juice for thick salivary secretions. He will continue to follow closely with medical oncology for management of pain medication.  -----------------------------------  Eppie Gibson, MD  This document serves as a record of services personally performed by Eppie Gibson, MD. It was created on her behalf by Maryla Morrow, a trained medical scribe. The creation of this record is based on the scribe's personal observations and the provider's statements to them. This document has been checked and approved by the attending provider.

## 2016-08-31 ENCOUNTER — Other Ambulatory Visit (HOSPITAL_BASED_OUTPATIENT_CLINIC_OR_DEPARTMENT_OTHER): Payer: BLUE CROSS/BLUE SHIELD

## 2016-08-31 ENCOUNTER — Ambulatory Visit
Admission: RE | Admit: 2016-08-31 | Discharge: 2016-08-31 | Disposition: A | Discharge status: Home / Self Care | Payer: BLUE CROSS/BLUE SHIELD | Source: Ambulatory Visit | Attending: Radiation Oncology | Admitting: Radiation Oncology

## 2016-08-31 DIAGNOSIS — C090 Malignant neoplasm of tonsillar fossa: Secondary | ICD-10-CM | POA: Diagnosis not present

## 2016-08-31 DIAGNOSIS — Z510 Encounter for antineoplastic radiation therapy: Secondary | ICD-10-CM | POA: Diagnosis not present

## 2016-08-31 LAB — COMPREHENSIVE METABOLIC PANEL
ALBUMIN: 3.6 g/dL (ref 3.5–5.0)
ALT: 16 U/L (ref 0–55)
ANION GAP: 8 meq/L (ref 3–11)
AST: 14 U/L (ref 5–34)
Alkaline Phosphatase: 51 U/L (ref 40–150)
BUN: 22.5 mg/dL (ref 7.0–26.0)
CO2: 27 mEq/L (ref 22–29)
CREATININE: 0.9 mg/dL (ref 0.7–1.3)
Calcium: 9.4 mg/dL (ref 8.4–10.4)
Chloride: 104 mEq/L (ref 98–109)
GLUCOSE: 121 mg/dL (ref 70–140)
Potassium: 4.1 mEq/L (ref 3.5–5.1)
Sodium: 139 mEq/L (ref 136–145)
TOTAL PROTEIN: 7.2 g/dL (ref 6.4–8.3)
Total Bilirubin: <0.22 mg/dL (ref 0.20–1.20)

## 2016-08-31 LAB — CBC WITH DIFFERENTIAL/PLATELET
BASO%: 0.4 % (ref 0.0–2.0)
BASOS ABS: 0 10*3/uL (ref 0.0–0.1)
EOS ABS: 0 10*3/uL (ref 0.0–0.5)
EOS%: 1.2 % (ref 0.0–7.0)
HCT: 41.9 % (ref 38.4–49.9)
HEMOGLOBIN: 14 g/dL (ref 13.0–17.1)
LYMPH%: 5.8 % — AB (ref 14.0–49.0)
MCH: 29.7 pg (ref 27.2–33.4)
MCHC: 33.5 g/dL (ref 32.0–36.0)
MCV: 88.9 fL (ref 79.3–98.0)
MONO#: 0.5 10*3/uL (ref 0.1–0.9)
MONO%: 16.5 % — ABNORMAL HIGH (ref 0.0–14.0)
NEUT%: 76.1 % — ABNORMAL HIGH (ref 39.0–75.0)
NEUTROS ABS: 2.5 10*3/uL (ref 1.5–6.5)
PLATELETS: 158 10*3/uL (ref 140–400)
RBC: 4.72 10*6/uL (ref 4.20–5.82)
RDW: 13 % (ref 11.0–14.6)
WBC: 3.2 10*3/uL — AB (ref 4.0–10.3)
lymph#: 0.2 10*3/uL — ABNORMAL LOW (ref 0.9–3.3)

## 2016-08-31 LAB — MAGNESIUM: Magnesium: 2.3 mg/dl (ref 1.5–2.5)

## 2016-09-01 ENCOUNTER — Ambulatory Visit (HOSPITAL_BASED_OUTPATIENT_CLINIC_OR_DEPARTMENT_OTHER): Payer: BLUE CROSS/BLUE SHIELD | Admitting: Hematology and Oncology

## 2016-09-01 ENCOUNTER — Other Ambulatory Visit: Payer: Self-pay | Admitting: Hematology and Oncology

## 2016-09-01 ENCOUNTER — Ambulatory Visit: Payer: BLUE CROSS/BLUE SHIELD | Admitting: Nutrition

## 2016-09-01 ENCOUNTER — Ambulatory Visit
Admission: RE | Admit: 2016-09-01 | Discharge: 2016-09-01 | Disposition: A | Discharge status: Home / Self Care | Payer: BLUE CROSS/BLUE SHIELD | Source: Ambulatory Visit | Attending: Radiation Oncology | Admitting: Radiation Oncology

## 2016-09-01 ENCOUNTER — Encounter: Payer: Self-pay | Admitting: Hematology and Oncology

## 2016-09-01 DIAGNOSIS — K1231 Oral mucositis (ulcerative) due to antineoplastic therapy: Secondary | ICD-10-CM

## 2016-09-01 DIAGNOSIS — C090 Malignant neoplasm of tonsillar fossa: Secondary | ICD-10-CM | POA: Diagnosis not present

## 2016-09-01 DIAGNOSIS — C770 Secondary and unspecified malignant neoplasm of lymph nodes of head, face and neck: Secondary | ICD-10-CM

## 2016-09-01 DIAGNOSIS — D701 Agranulocytosis secondary to cancer chemotherapy: Secondary | ICD-10-CM

## 2016-09-01 DIAGNOSIS — Z510 Encounter for antineoplastic radiation therapy: Secondary | ICD-10-CM | POA: Diagnosis not present

## 2016-09-01 DIAGNOSIS — R634 Abnormal weight loss: Secondary | ICD-10-CM | POA: Diagnosis not present

## 2016-09-01 DIAGNOSIS — T451X5A Adverse effect of antineoplastic and immunosuppressive drugs, initial encounter: Secondary | ICD-10-CM

## 2016-09-01 NOTE — Assessment & Plan Note (Signed)
This is likely due to recent treatment. The patient denies recent history of fevers, cough, chills, diarrhea or dysuria. He is asymptomatic from the leukopenia. I will observe for now.  I will continue the chemotherapy at current dose without dosage adjustment.  If the leukopenia gets progressive worse in the future, I might have to delay his treatment or adjust the chemotherapy dose. 

## 2016-09-01 NOTE — Progress Notes (Signed)
[place] OFFICE PROGRESS NOTE  Patient Care Team: Patient, No Pcp Per as PCP - General (General Practice) Rozetta Nunnery, MD as Consulting Physician (Otolaryngology) Eppie Gibson, MD as Attending Physician (Radiation Oncology) Heath Lark, MD as Consulting Physician (Hematology and Oncology) Lenn Cal, DDS as Consulting Physician (Dentistry) Leota Sauers, RN as Oncology Nurse Navigator Karie Mainland, RD as Dietitian (Nutrition) Valentino Saxon Perry Mount, CCC-SLP as Speech Language Pathologist (Speech Pathology) Jomarie Longs, PT as Physical Therapist (Physical Therapy) Kennith Center, LCSW as Social Worker  SUMMARY OF ONCOLOGIC HISTORY:   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 Possible early nodal disease in the right level 2 neck. Asymmetric fullness of the left tonsillar fossa, suspect squamous cell carcinoma primary.       06/28/2016 Pathology Results    Diagnosis Tonsil, biopsy, left - INVASIVE SQUAMOUS CELL CARCINOMA. - SEE COMMENT. Microscopic Comment The carcinoma appears moderately differentiated and is non-keratinizing. A p16 stain will be performed and the results reported separately The malignant cells are strongly and diffusely positive for p16.      07/05/2016 PET scan    1. Hypermetabolism in the region of the left palatini tonsil, consistent with known primary malignancy. 2. Hypermetabolic left-sided level II and III metastatic cervical lymphadenopathy. 3. 6 mm posterior left lower lobe pulmonary nodule. Attention on follow-up recommended as metastatic disease not excluded. 4. Cholelithiasis. 5. Nephrolithiasis. 6. Coronary artery and thoracoabdominal aortic atherosclerosis.      08/02/2016 Miscellaneous    Baseline hearing test is near normal      08/05/2016 Procedure    Fluoroscopic insertion  of a 20-French "pull-through" gastrostomy      08/05/2016 Procedure    Ultrasound and fluoroscopically guided right internal jugular single lumen power port catheter insertion. Tip in the SVC/RA junction. Catheter ready for use.      08/12/2016 -  Chemotherapy    Started chemo cisplatin       INTERVAL HISTORY: Please see below for problem oriented charting. He is seen prior to cycle 2 of treatment He has some dysgeusia, pain and mild nausea with treatment Denies hearing problem or neuropathy He felt that the neck swelling is improving He denies constipation  REVIEW OF SYSTEMS:   Constitutional: Denies fevers, chills or abnormal weight loss Eyes: Denies blurriness of vision Respiratory: Denies cough, dyspnea or wheezes Cardiovascular: Denies palpitation, chest discomfort or lower extremity swelling Skin: Denies abnormal skin rashes Lymphatics: Denies new lymphadenopathy or easy bruising Neurological:Denies numbness, tingling or new weaknesses Behavioral/Psych: Mood is stable, no new changes  All other systems were reviewed with the patient and are negative.  I have reviewed the past medical history, past surgical history, social history and family history with the patient and they are unchanged from previous note.  ALLERGIES:  has No Known Allergies.  MEDICATIONS:  Current Outpatient Prescriptions  Medication Sig Dispense Refill  . acetaminophen (TYLENOL) 325 MG tablet Take 650 mg by mouth every 6 (six) hours as needed. Pt states he takes 3 tabs when he takes    . lidocaine-prilocaine (EMLA) cream Apply to affected area once 30 g 3  . Melatonin 10 MG TABS Take by mouth.    . Multiple Vitamin (MULTIVITAMIN) tablet Take 1 tablet by mouth daily.    . Omega-3 Fatty Acids (FISH OIL OMEGA-3 PO) Take by  mouth.    . ondansetron (ZOFRAN) 8 MG tablet Take 1 tablet (8 mg total) by mouth 2 (two) times daily as needed. Start on the third day after chemotherapy. 30 tablet 1  . VALERIAN  ROOT PO Take by mouth.    . fentaNYL (DURAGESIC - DOSED MCG/HR) 25 MCG/HR patch Place 1 patch (25 mcg total) onto the skin every 3 (three) days. (Patient not taking: Reported on 08/30/2016) 5 patch 0  . lidocaine (XYLOCAINE) 2 % solution Patient: Mix 1part 2% viscous lidocaine, 1part H20. Swish & swallow 6mL of diluted mixture, 26min before meals and at bedtime, up to QID (Patient not taking: Reported on 08/30/2016) 100 mL 5  . Morphine Sulfate (MORPHINE CONCENTRATE) 10 mg / 0.5 ml concentrated solution Take 0.5 mLs (10 mg total) by mouth every 2 (two) hours as needed for severe pain. (Patient not taking: Reported on 08/30/2016) 240 mL 0  . prochlorperazine (COMPAZINE) 10 MG tablet Take 1 tablet (10 mg total) by mouth every 6 (six) hours as needed (Nausea or vomiting). (Patient not taking: Reported on 08/18/2016) 30 tablet 1   No current facility-administered medications for this visit.     PHYSICAL EXAMINATION: ECOG PERFORMANCE STATUS: 1 - Symptomatic but completely ambulatory  Vitals:   09/01/16 1225  BP: 133/87  Pulse: (!) 101  Resp: 18  Temp: 99.3 F (37.4 C)   Filed Weights   09/01/16 1225  Weight: 191 lb 12.8 oz (87 kg)    GENERAL:alert, no distress and comfortable SKIN: skin color, texture, turgor are normal, no rashes or significant lesions EYES: normal, Conjunctiva are pink and non-injected, sclera clear OROPHARYNX:no exudate, no erythema and lips, buccal mucosa, and tongue normal  NECK: supple, thyroid normal size, non-tender, without nodularity LYMPH: He has persistent bilateral neck lymphadenopathy, reduce in size LUNGS: clear to auscultation and percussion with normal breathing effort HEART: regular rate & rhythm and no murmurs and no lower extremity edema ABDOMEN:abdomen soft, non-tender and normal bowel sounds Musculoskeletal:no cyanosis of digits and no clubbing  NEURO: alert & oriented x 3 with fluent speech, no focal motor/sensory deficits  LABORATORY DATA:  I  have reviewed the data as listed    Component Value Date/Time   NA 139 08/31/2016 1430   K 4.1 08/31/2016 1430   CL 105 08/05/2016 1159   CO2 27 08/31/2016 1430   GLUCOSE 121 08/31/2016 1430   BUN 22.5 08/31/2016 1430   CREATININE 0.9 08/31/2016 1430   CALCIUM 9.4 08/31/2016 1430   PROT 7.2 08/31/2016 1430   ALBUMIN 3.6 08/31/2016 1430   AST 14 08/31/2016 1430   ALT 16 08/31/2016 1430   ALKPHOS 51 08/31/2016 1430   BILITOT <0.22 08/31/2016 1430   GFRNONAA >60 08/05/2016 1159   GFRAA >60 08/05/2016 1159    No results found for: SPEP, UPEP  Lab Results  Component Value Date   WBC 3.2 (L) 08/31/2016   NEUTROABS 2.5 08/31/2016   HGB 14.0 08/31/2016   HCT 41.9 08/31/2016   MCV 88.9 08/31/2016   PLT 158 08/31/2016      Chemistry      Component Value Date/Time   NA 139 08/31/2016 1430   K 4.1 08/31/2016 1430   CL 105 08/05/2016 1159   CO2 27 08/31/2016 1430   BUN 22.5 08/31/2016 1430   CREATININE 0.9 08/31/2016 1430      Component Value Date/Time   CALCIUM 9.4 08/31/2016 1430   ALKPHOS 51 08/31/2016 1430   AST 14 08/31/2016 1430  ALT 16 08/31/2016 1430   BILITOT <0.22 08/31/2016 1430       RADIOGRAPHIC STUDIES: I have personally reviewed the radiological images as listed and agreed with the findings in the report. Ir Gastrostomy Tube Mod Sed  Result Date: 08/05/2016 INDICATION: Tonsillar squamous cell carcinoma EXAM: FLUOROSCOPIC 20 FRENCH PULL-THROUGH GASTROSTOMY Date:  5/4/20185/07/2016 3:37 pm Radiologist:  M. Daryll Brod, MD Guidance:  Fluoroscopic MEDICATIONS: Ancef 2 g; Antibiotics were administered within 1 hour of the procedure. Glucagon 0.5 mg IV ANESTHESIA/SEDATION: Versed 1.0 mg IV; Fentanyl 25 mcg IV Moderate Sedation Time:  10 minutes The patient was continuously monitored during the procedure by the interventional radiology nurse under my direct supervision. CONTRAST:  81mL ISOVUE-300 IOPAMIDOL (ISOVUE-300) INJECTION 61% - administered into the gastric  lumen. FLUOROSCOPY TIME:  Fluoroscopy Time: 1 minutes 30 seconds (21 mGy). COMPLICATIONS: None immediate. PROCEDURE: Informed consent was obtained from the patient following explanation of the procedure, risks, benefits and alternatives. The patient understands, agrees and consents for the procedure. All questions were addressed. A time out was performed. Maximal barrier sterile technique utilized including caps, mask, sterile gowns, sterile gloves, large sterile drape, hand hygiene, and betadine prep. The left upper quadrant was sterilely prepped and draped. An oral gastric catheter was inserted into the stomach under fluoroscopy. The existing nasogastric feeding tube was removed. Air was injected into the stomach for insufflation and visualization under fluoroscopy. The air distended stomach was confirmed beneath the anterior abdominal wall in the frontal and lateral projections. Under sterile conditions and local anesthesia, a 54 gauge trocar needle was utilized to access the stomach percutaneously beneath the left subcostal margin. Needle position was confirmed within the stomach under biplane fluoroscopy. Contrast injection confirmed position also. A single T tack was deployed for gastropexy. Over an Amplatz guide wire, a 9-French sheath was inserted into the stomach. A snare device was utilized to capture the oral gastric catheter. The snare device was pulled retrograde from the stomach up the esophagus and out the oropharynx. The 20-French pull-through gastrostomy was connected to the snare device and pulled antegrade through the oropharynx down the esophagus into the stomach and then through the percutaneous tract external to the patient. The gastrostomy was assembled externally. Contrast injection confirms position in the stomach. Images were obtained for documentation. The patient tolerated procedure well. No immediate complication. IMPRESSION: Fluoroscopic insertion of a 20-French "pull-through"  gastrostomy. Electronically Signed   By: Jerilynn Mages.  Shick M.D.   On: 08/05/2016 15:49   Ir US Guide Vasc Access Right  Result Date: 08/05/2016 CLINICAL DATA:  Squamous cell tonsillar carcinoma EXAM: RIGHT INTERNAL JUGULAR SINGLE LUMEN POWER PORT CATHETER INSERTION Date:  5/4/20185/07/2016 3:38 pm Radiologist:  M. Daryll Brod, MD Guidance:  Ultrasound and fluoroscopic MEDICATIONS: Ancef 2 g; The antibiotic was administered within an appropriate time interval prior to skin puncture. ANESTHESIA/SEDATION: Versed 4 mg IV; Fentanyl 75 mcg IV; Moderate Sedation Time:  35 minutes The patient was continuously monitored during the procedure by the interventional radiology nurse under my direct supervision. FLUOROSCOPY TIME:  24 seconds (4 mGy) COMPLICATIONS: None immediate. CONTRAST:  None. PROCEDURE: Informed consent was obtained from the patient following explanation of the procedure, risks, benefits and alternatives. The patient understands, agrees and consents for the procedure. All questions were addressed. A time out was performed. Maximal barrier sterile technique utilized including caps, mask, sterile gowns, sterile gloves, large sterile drape, hand hygiene, and 2% chlorhexidine scrub. Under sterile conditions and local anesthesia, right internal jugular micropuncture venous access was  performed. Access was performed with ultrasound. Images were obtained for documentation. A guide wire was inserted followed by a transitional dilator. This allowed insertion of a guide wire and catheter into the IVC. Measurements were obtained from the SVC / RA junction back to the right IJ venotomy site. In the right infraclavicular chest, a subcutaneous pocket was created over the second anterior rib. This was done under sterile conditions and local anesthesia. 1% lidocaine with epinephrine was utilized for this. A 2.5 cm incision was made in the skin. Blunt dissection was performed to create a subcutaneous pocket over the right  pectoralis major muscle. The pocket was flushed with saline vigorously. There was adequate hemostasis. The port catheter was assembled and checked for leakage. The port catheter was secured in the pocket with two retention sutures. The tubing was tunneled subcutaneously to the right venotomy site and inserted into the SVC/RA junction through a valved peel-away sheath. Position was confirmed with fluoroscopy. Images were obtained for documentation. The patient tolerated the procedure well. No immediate complications. Incisions were closed in a two layer fashion with 4 - 0 Vicryl suture. Dermabond was applied to the skin. The port catheter was accessed, blood was aspirated followed by saline and heparin flushes. Needle was removed. A dry sterile dressing was applied. IMPRESSION: Ultrasound and fluoroscopically guided right internal jugular single lumen power port catheter insertion. Tip in the SVC/RA junction. Catheter ready for use. Electronically Signed   By: Jerilynn Mages.  Shick M.D.   On: 08/05/2016 15:50   Ir Fluoro Guide Port Insertion Right  Result Date: 08/05/2016 CLINICAL DATA:  Squamous cell tonsillar carcinoma EXAM: RIGHT INTERNAL JUGULAR SINGLE LUMEN POWER PORT CATHETER INSERTION Date:  5/4/20185/07/2016 3:38 pm Radiologist:  M. Daryll Brod, MD Guidance:  Ultrasound and fluoroscopic MEDICATIONS: Ancef 2 g; The antibiotic was administered within an appropriate time interval prior to skin puncture. ANESTHESIA/SEDATION: Versed 4 mg IV; Fentanyl 75 mcg IV; Moderate Sedation Time:  35 minutes The patient was continuously monitored during the procedure by the interventional radiology nurse under my direct supervision. FLUOROSCOPY TIME:  24 seconds (4 mGy) COMPLICATIONS: None immediate. CONTRAST:  None. PROCEDURE: Informed consent was obtained from the patient following explanation of the procedure, risks, benefits and alternatives. The patient understands, agrees and consents for the procedure. All questions were  addressed. A time out was performed. Maximal barrier sterile technique utilized including caps, mask, sterile gowns, sterile gloves, large sterile drape, hand hygiene, and 2% chlorhexidine scrub. Under sterile conditions and local anesthesia, right internal jugular micropuncture venous access was performed. Access was performed with ultrasound. Images were obtained for documentation. A guide wire was inserted followed by a transitional dilator. This allowed insertion of a guide wire and catheter into the IVC. Measurements were obtained from the SVC / RA junction back to the right IJ venotomy site. In the right infraclavicular chest, a subcutaneous pocket was created over the second anterior rib. This was done under sterile conditions and local anesthesia. 1% lidocaine with epinephrine was utilized for this. A 2.5 cm incision was made in the skin. Blunt dissection was performed to create a subcutaneous pocket over the right pectoralis major muscle. The pocket was flushed with saline vigorously. There was adequate hemostasis. The port catheter was assembled and checked for leakage. The port catheter was secured in the pocket with two retention sutures. The tubing was tunneled subcutaneously to the right venotomy site and inserted into the SVC/RA junction through a valved peel-away sheath. Position was confirmed with fluoroscopy. Images were  obtained for documentation. The patient tolerated the procedure well. No immediate complications. Incisions were closed in a two layer fashion with 4 - 0 Vicryl suture. Dermabond was applied to the skin. The port catheter was accessed, blood was aspirated followed by saline and heparin flushes. Needle was removed. A dry sterile dressing was applied. IMPRESSION: Ultrasound and fluoroscopically guided right internal jugular single lumen power port catheter insertion. Tip in the SVC/RA junction. Catheter ready for use. Electronically Signed   By: Jerilynn Mages.  Shick M.D.   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 The patient denies recent history of fevers, cough, chills, diarrhea or dysuria. He is asymptomatic from the leukopenia. I will observe for now.  I will continue the chemotherapy at current dose without dosage adjustment.  If the leukopenia gets progressive worse in the future, I might have to delay his treatment or adjust the chemotherapy dose.     No orders of the defined types were placed in this encounter.  All questions were answered. The patient knows to call the clinic with any problems, questions or concerns. No barriers to learning was detected. I spent 15 minutes counseling the patient face to face. The total time spent in the appointment was 20 minutes and more than 50% was on counseling and review of test results     Heath Lark, MD 09/01/2016 12:47 PM

## 2016-09-01 NOTE — Assessment & Plan Note (Signed)
Recommend he takes prescription pain medicine as prescribed and I will reassess pain control next week

## 2016-09-01 NOTE — Assessment & Plan Note (Signed)
He had recent mild weight loss due to dysgeusia He will continue to increase oral intake as tolerated

## 2016-09-01 NOTE — Assessment & Plan Note (Signed)
So far, he tolerated treatment well except for recent weight loss and worsening mucositis. We discussed importance of increasing oral intake as tolerated I also recommend he start taking fentanyl patch and oral liquid morphine sulfate as needed for pain control I will continue to see him on a weekly basis for supportive care and will reassess pain control next week

## 2016-09-01 NOTE — Progress Notes (Signed)
Nutrition follow-up completed with patient after radiation therapy for tonsil cancer. Continues to receive both chemotherapy and radiation treatments. Weight improved documented as 191.8 pounds on May 31. Patient is able to eat soft foods.  He does not tolerate anything spicy. He is using 4 bottles of Osmolite 1.5 via PEG daily. He reports dry mouth at night. Denies nausea, vomiting, diarrhea, and constipation.  Nutrition diagnosis: Inadequate oral intake continues.  Intervention: Patient educated to continue 4 bottles Osmolite 1.5 4 times a day along with soft foods and Ensure Plus. Patient understands goal is weight maintenance. Encouraged patient to increase tube feedings when oral intake decreases. Recommended patient add coolmist humidifier to bedroom over night to decrease dry mouth Patient offered nutrition samples.  However, he declines.  Monitoring, evaluation, goals: Patient will tolerate tube feeding plus oral intake to promote weight maintenance.  Next visit: Thursday, June 7, after M.D. Visit..  **Disclaimer: This note was dictated with voice recognition software. Similar sounding words can inadvertently be transcribed and this note may contain transcription errors which may not have been corrected upon publication of note.**

## 2016-09-01 NOTE — Assessment & Plan Note (Signed)
The neck swelling has reduced in size Continue treatment

## 2016-09-02 ENCOUNTER — Ambulatory Visit
Admission: RE | Admit: 2016-09-02 | Discharge: 2016-09-02 | Disposition: A | Discharge status: Home / Self Care | Payer: BLUE CROSS/BLUE SHIELD | Source: Ambulatory Visit | Attending: Radiation Oncology | Admitting: Radiation Oncology

## 2016-09-02 ENCOUNTER — Ambulatory Visit: Payer: Self-pay

## 2016-09-02 ENCOUNTER — Telehealth: Payer: Self-pay | Admitting: *Deleted

## 2016-09-02 VITALS — BP 147/93 | HR 101 | Temp 99.1°F | Ht 69.0 in | Wt 191.2 lb

## 2016-09-02 DIAGNOSIS — Z510 Encounter for antineoplastic radiation therapy: Secondary | ICD-10-CM | POA: Diagnosis not present

## 2016-09-02 DIAGNOSIS — C090 Malignant neoplasm of tonsillar fossa: Secondary | ICD-10-CM

## 2016-09-02 NOTE — Telephone Encounter (Signed)
Pt states he does not feel good and does not want chemo today. Discussed possible IVF instead of infusion. Pt agitated, states he "just wants to go home, I told Dr Sharnese Heath Klippel I didn't want to come today". Pt declined going to infusion room, left with wife.

## 2016-09-02 NOTE — Progress Notes (Signed)
Patient has completed 16 fractions to his left tonsil.  He reports starting to feel sick yesterday.  This morning he woke up and had a 100.8 degree temperature.  He took 3 aspirin and called the doctor on call.  He said an hour later his temp was 100.2.  It was 99.1 when taken in the clinic.  He reports he is achy and feels hot.  He also had dry heaves this morning.  He denies having a cough or any respiratory symptoms.  He does report having thick saliva and a dry mouth.  He is scheduled to have chemotherapy today.  BP (!) 147/93 (BP Location: Right Arm, Patient Position: Sitting)   Pulse (!) 101   Temp 99.1 F (37.3 C) (Oral)   Ht 5\' 9"  (1.753 m)   Wt 191 lb 3.2 oz (86.7 kg)   SpO2 98%   BMI 28.24 kg/m    Wt Readings from Last 3 Encounters:  09/02/16 191 lb 3.2 oz (86.7 kg)  09/01/16 191 lb 12.8 oz (87 kg)  08/30/16 190 lb (86.2 kg)

## 2016-09-02 NOTE — Progress Notes (Signed)
   Weekly Management Note:  outpatient    ICD-9-CM ICD-10-CM   1. Cancer of tonsillar fossa ([facility]) 146.1 C09.0     Current Dose:  32 Gy  Projected Dose: 70 Gy   Narrative:  The patient presents for routine under treatment assessment.  CBCT/MVCT images/Port film x-rays were reviewed.  The chart was checked.   The patient presents today for his 17th fraction of radiation to the left tonsil.   He started to feel sick yesterday and woke with 100.47F temp this AM.  Took 3 aspirin, called Dr. Tammi Klippel on call who told him to report to me this AM.  Dry heaves this AM.  No cough or SOB.    Physical Findings:  Wt Readings from Last 3 Encounters:  09/02/16 191 lb 3.2 oz (86.7 kg)  09/01/16 191 lb 12.8 oz (87 kg)  08/30/16 190 lb (86.2 kg)    height is 5\' 9"  (1.753 m) and weight is 191 lb 3.2 oz (86.7 kg). His oral temperature is 99.1 F (37.3 C). His blood pressure is 147/93 (abnormal) and his pulse is 101 (abnormal). His oxygen saturation is 98%.   Patchy mucositis with erythema in the throat. Mucous membranes moist. Skin slightly erythematous but intact. Non toxic appearing, no dyspnea, he is ambulatory   CBC    Component Value Date/Time   WBC 3.2 (L) 08/31/2016 1430   WBC 8.7 08/05/2016 1159   RBC 4.72 08/31/2016 1430   RBC 4.88 08/05/2016 1159   HGB 14.0 08/31/2016 1430   HCT 41.9 08/31/2016 1430   PLT 158 08/31/2016 1430   MCV 88.9 08/31/2016 1430   MCH 29.7 08/31/2016 1430   MCH 30.1 08/05/2016 1159   MCHC 33.5 08/31/2016 1430   MCHC 33.7 08/05/2016 1159   RDW 13.0 08/31/2016 1430   LYMPHSABS 0.2 (L) 08/31/2016 1430   MONOABS 0.5 08/31/2016 1430   EOSABS 0.0 08/31/2016 1430   BASOSABS 0.0 08/31/2016 1430     CMP     Component Value Date/Time   NA 139 08/31/2016 1430   K 4.1 08/31/2016 1430   CL 105 08/05/2016 1159   CO2 27 08/31/2016 1430   GLUCOSE 121 08/31/2016 1430   BUN 22.5 08/31/2016 1430   CREATININE 0.9 08/31/2016 1430   CALCIUM 9.4 08/31/2016 1430   PROT  7.2 08/31/2016 1430   ALBUMIN 3.6 08/31/2016 1430   AST 14 08/31/2016 1430   ALT 16 08/31/2016 1430   ALKPHOS 51 08/31/2016 1430   BILITOT <0.22 08/31/2016 1430   GFRNONAA >60 08/05/2016 1159   GFRAA >60 08/05/2016 1159    Impression:  The patient is tolerating radiotherapy but reports temp recently of 100.8 and feels under the weather.   Plan:  Continue radiotherapy as planned.  He will see medical oncology this AM for possible chemotherapy, though this may be held, and further assessment may be performed in light of his symptoms/temperature. I will alert Dr Alvy Bimler -----------------------------------  Eppie Gibson, MD  This document serves as a record of services personally performed by Eppie Gibson, MD. It was created on her behalf by Maryla Morrow, a trained medical scribe. The creation of this record is based on the scribe's personal observations and the provider's statements to them. This document has been checked and approved by the attending provider.

## 2016-09-03 ENCOUNTER — Ambulatory Visit: Payer: BLUE CROSS/BLUE SHIELD

## 2016-09-05 ENCOUNTER — Ambulatory Visit
Admission: RE | Admit: 2016-09-05 | Discharge: 2016-09-05 | Disposition: A | Discharge status: Home / Self Care | Payer: BLUE CROSS/BLUE SHIELD | Source: Ambulatory Visit | Attending: Radiation Oncology | Admitting: Radiation Oncology

## 2016-09-05 ENCOUNTER — Telehealth: Payer: Self-pay | Admitting: Hematology and Oncology

## 2016-09-05 ENCOUNTER — Encounter: Payer: Self-pay | Admitting: Radiation Oncology

## 2016-09-05 VITALS — BP 126/90 | HR 89 | Temp 98.3°F | Resp 18 | Wt 189.0 lb

## 2016-09-05 DIAGNOSIS — Z510 Encounter for antineoplastic radiation therapy: Secondary | ICD-10-CM | POA: Diagnosis not present

## 2016-09-05 DIAGNOSIS — C090 Malignant neoplasm of tonsillar fossa: Secondary | ICD-10-CM

## 2016-09-05 NOTE — Telephone Encounter (Signed)
Left message re new appointments for 6/8 and 6/9. Patient to get new schedule at 6/6 or 6/7 visit.

## 2016-09-05 NOTE — Progress Notes (Signed)
   Weekly Management Note:  outpatient    ICD-9-CM ICD-10-CM   1. 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  CBCT/MVCT images/Port film x-rays were reviewed.  The chart was checked.   The patient presents today with another reported episode of fever in the past 2 days.  On/off dry heaves.  Weight is increased by 2 lbs.  No pain.   Physical Findings:  Wt Readings from Last 3 Encounters:  09/05/16 189 lb (85.7 kg)  09/02/16 191 lb 3.2 oz (86.7 kg)  09/01/16 191 lb 12.8 oz (87 kg)    weight is 189 lb (85.7 kg). His oral temperature is 98.3 F (36.8 C). His blood pressure is 126/90 and his pulse is 89. His respiration is 18 and oxygen saturation is 97%.   Patchy mucositis with erythema in the throat. Mucous membranes moist. Skin slightly dry/erythematous but intact. Still a palpable left upper neck mass. Non toxic appearing, no dyspnea, he is ambulatory   CBC    Component Value Date/Time   WBC 3.2 (L) 08/31/2016 1430   WBC 8.7 08/05/2016 1159   RBC 4.72 08/31/2016 1430   RBC 4.88 08/05/2016 1159   HGB 14.0 08/31/2016 1430   HCT 41.9 08/31/2016 1430   PLT 158 08/31/2016 1430   MCV 88.9 08/31/2016 1430   MCH 29.7 08/31/2016 1430   MCH 30.1 08/05/2016 1159   MCHC 33.5 08/31/2016 1430   MCHC 33.7 08/05/2016 1159   RDW 13.0 08/31/2016 1430   LYMPHSABS 0.2 (L) 08/31/2016 1430   MONOABS 0.5 08/31/2016 1430   EOSABS 0.0 08/31/2016 1430   BASOSABS 0.0 08/31/2016 1430     CMP     Component Value Date/Time   NA 139 08/31/2016 1430   K 4.1 08/31/2016 1430   CL 105 08/05/2016 1159   CO2 27 08/31/2016 1430   GLUCOSE 121 08/31/2016 1430   BUN 22.5 08/31/2016 1430   CREATININE 0.9 08/31/2016 1430   CALCIUM 9.4 08/31/2016 1430   PROT 7.2 08/31/2016 1430   ALBUMIN 3.6 08/31/2016 1430   AST 14 08/31/2016 1430   ALT 16 08/31/2016 1430   ALKPHOS 51  08/31/2016 1430   BILITOT <0.22 08/31/2016 1430   GFRNONAA >60 08/05/2016 1159   GFRAA >60 08/05/2016 1159    Impression:  The patient is tolerating radiotherapy but reports elevated temp recently , again  Plan:  Continue radiotherapy as planned.   I will again alert Dr Alvy Bimler about reported "fever" -----------------------------------  Eppie Gibson, MD

## 2016-09-05 NOTE — Progress Notes (Addendum)
Weekly rd txs left tomnsil, mild erythema on neck, using biafine cream daily,  Takes 4 cans osmolite 1.5 cl can with free water before and after  Via peg,  No rednessa round site, still eating stated,  Had a fever and nausea over the weekend, dry heaves stated since Friday, no c/o pain,  Gave supplies  Dressings for his peg site 2:16 PM BP 126/90   Pulse 89   Temp 98.3 F (36.8 C) (Oral)   Resp 18   Wt 189 lb (85.7 kg)   SpO2 97% Comment: room air  BMI 27.91 kg/m   Wt Readings from Last 3 Encounters:  09/05/16 189 lb (85.7 kg)  09/02/16 191 lb 3.2 oz (86.7 kg)  09/01/16 191 lb 12.8 oz (87 kg)

## 2016-09-06 ENCOUNTER — Ambulatory Visit
Admission: RE | Admit: 2016-09-06 | Discharge: 2016-09-06 | Disposition: A | Discharge status: Home / Self Care | Payer: BLUE CROSS/BLUE SHIELD | Source: Ambulatory Visit | Attending: Radiation Oncology | Admitting: Radiation Oncology

## 2016-09-06 DIAGNOSIS — Z510 Encounter for antineoplastic radiation therapy: Secondary | ICD-10-CM | POA: Diagnosis not present

## 2016-09-07 ENCOUNTER — Ambulatory Visit
Admission: RE | Admit: 2016-09-07 | Discharge: 2016-09-07 | Disposition: A | Discharge status: Home / Self Care | Payer: BLUE CROSS/BLUE SHIELD | Source: Ambulatory Visit | Attending: Radiation Oncology | Admitting: Radiation Oncology

## 2016-09-07 ENCOUNTER — Encounter: Payer: Self-pay | Admitting: *Deleted

## 2016-09-07 ENCOUNTER — Other Ambulatory Visit (HOSPITAL_BASED_OUTPATIENT_CLINIC_OR_DEPARTMENT_OTHER): Payer: BLUE CROSS/BLUE SHIELD

## 2016-09-07 ENCOUNTER — Telehealth: Payer: Self-pay | Admitting: *Deleted

## 2016-09-07 DIAGNOSIS — C090 Malignant neoplasm of tonsillar fossa: Secondary | ICD-10-CM | POA: Diagnosis not present

## 2016-09-07 DIAGNOSIS — Z510 Encounter for antineoplastic radiation therapy: Secondary | ICD-10-CM | POA: Diagnosis not present

## 2016-09-07 LAB — COMPREHENSIVE METABOLIC PANEL
ALT: 26 U/L (ref 0–55)
ANION GAP: 10 meq/L (ref 3–11)
AST: 18 U/L (ref 5–34)
Albumin: 3.8 g/dL (ref 3.5–5.0)
Alkaline Phosphatase: 42 U/L (ref 40–150)
BUN: 19.5 mg/dL (ref 7.0–26.0)
CALCIUM: 9.9 mg/dL (ref 8.4–10.4)
CHLORIDE: 100 meq/L (ref 98–109)
CO2: 27 meq/L (ref 22–29)
Creatinine: 0.9 mg/dL (ref 0.7–1.3)
Glucose: 85 mg/dl (ref 70–140)
Potassium: 4.4 mEq/L (ref 3.5–5.1)
Sodium: 137 mEq/L (ref 136–145)
Total Bilirubin: 0.38 mg/dL (ref 0.20–1.20)
Total Protein: 7.5 g/dL (ref 6.4–8.3)

## 2016-09-07 LAB — CBC WITH DIFFERENTIAL/PLATELET
BASO%: 0.3 % (ref 0.0–2.0)
BASOS ABS: 0 10*3/uL (ref 0.0–0.1)
EOS ABS: 0.1 10*3/uL (ref 0.0–0.5)
EOS%: 1.7 % (ref 0.0–7.0)
HEMATOCRIT: 41.3 % (ref 38.4–49.9)
HGB: 13.9 g/dL (ref 13.0–17.1)
LYMPH%: 6.8 % — AB (ref 14.0–49.0)
MCH: 29.8 pg (ref 27.2–33.4)
MCHC: 33.7 g/dL (ref 32.0–36.0)
MCV: 88.4 fL (ref 79.3–98.0)
MONO#: 0.8 10*3/uL (ref 0.1–0.9)
MONO%: 15.4 % — ABNORMAL HIGH (ref 0.0–14.0)
NEUT#: 3.8 10*3/uL (ref 1.5–6.5)
NEUT%: 75.8 % — ABNORMAL HIGH (ref 39.0–75.0)
PLATELETS: 210 10*3/uL (ref 140–400)
RBC: 4.67 10*6/uL (ref 4.20–5.82)
RDW: 13.4 % (ref 11.0–14.6)
WBC: 5.1 10*3/uL (ref 4.0–10.3)
lymph#: 0.3 10*3/uL — ABNORMAL LOW (ref 0.9–3.3)

## 2016-09-07 LAB — MAGNESIUM: MAGNESIUM: 2.3 mg/dL (ref 1.5–2.5)

## 2016-09-07 NOTE — Telephone Encounter (Signed)
Oncology Nurse Navigator Documentation  Per request received from patient 08/24/16, faxed letter (Dr. Isidore Moos) and documentation (07/08/16 Progress Note Dr. Enrique Sack, 07/13/16 Progress Note Dr. Isidore Moos) to [facility] in support of pre-radiotherapy medically necessary dental procedures for claim currently denied.  Notification of successful fax transmission received.  Copy of information provided to patient.  Gayleen Orem, RN, BSN, [place] Oncology Nurse [place] [facility] [place] [phone]

## 2016-09-08 ENCOUNTER — Ambulatory Visit: Payer: BLUE CROSS/BLUE SHIELD

## 2016-09-08 ENCOUNTER — Ambulatory Visit (HOSPITAL_BASED_OUTPATIENT_CLINIC_OR_DEPARTMENT_OTHER): Payer: BLUE CROSS/BLUE SHIELD | Admitting: Hematology and Oncology

## 2016-09-08 ENCOUNTER — Encounter: Payer: Self-pay | Admitting: Hematology and Oncology

## 2016-09-08 ENCOUNTER — Ambulatory Visit: Payer: BLUE CROSS/BLUE SHIELD | Admitting: Nutrition

## 2016-09-08 DIAGNOSIS — R110 Nausea: Secondary | ICD-10-CM

## 2016-09-08 DIAGNOSIS — C090 Malignant neoplasm of tonsillar fossa: Secondary | ICD-10-CM | POA: Diagnosis not present

## 2016-09-08 DIAGNOSIS — R432 Parageusia: Secondary | ICD-10-CM

## 2016-09-08 DIAGNOSIS — K1231 Oral mucositis (ulcerative) due to antineoplastic therapy: Secondary | ICD-10-CM | POA: Diagnosis not present

## 2016-09-08 NOTE — Progress Notes (Signed)
Nutrition follow-up completed with patient after radiation therapy for tonsil cancer. Weight decreased and documented as 189.8 pounds on June 7, down from 191.8 pounds May 31st. He reports all food tastes like cardboard. He is trying to eat food for lunch and dinner and will drink one bottle of Ensure Plus daily. He reports using 4 bottles of Osmolite 1.5 via PEG daily without nutrition impact symptoms. He denies nausea, vomiting, diarrhea, constipation.  Nutrition diagnosis: Inadequate oral intake continues.  Intervention: Educated patient to increase Osmolite 1.5/Ensure Plus to 6 bottles daily. Recommended he continue to try to eat food at lunch and dinner. Enforced strategies of adequate fluids and cool mist humidifier to decrease dry mouth. Teach back method used.  Monitoring, evaluation, goals: Patient will tolerate tube feeding plus oral intake to promote weight maintenance.  Next visit: Thursday, June 14.  **Disclaimer: This note was dictated with voice recognition software. Similar sounding words can inadvertently be transcribed and this note may contain transcription errors which may not have been corrected upon publication of note.**

## 2016-09-08 NOTE — Assessment & Plan Note (Signed)
I recommend he takes antiemetics.

## 2016-09-08 NOTE — Assessment & Plan Note (Signed)
Recommend he takes prescription pain medicine as prescribed and I will reassess pain control next week

## 2016-09-08 NOTE — Progress Notes (Signed)
[place] OFFICE PROGRESS NOTE  Patient Care Team: Patient, No Pcp Per as PCP - General (General Practice) Rozetta Nunnery, MD as Consulting Physician (Otolaryngology) Eppie Gibson, MD as Attending Physician (Radiation Oncology) Heath Lark, MD as Consulting Physician (Hematology and Oncology) Lenn Cal, DDS as Consulting Physician (Dentistry) Leota Sauers, RN as Oncology Nurse Navigator Karie Mainland, RD as Dietitian (Nutrition) Valentino Saxon [place], CCC-SLP as Speech Language Pathologist (Speech Pathology) Jomarie Longs, PT as Physical Therapist (Physical Therapy) Kennith Center, LCSW as Social Worker  SUMMARY OF ONCOLOGIC HISTORY:   Cancer of tonsillar fossa ([place])   06/27/2016 Imaging    Ct neck: Multiple malignant appearing lymph nodes in the left cervical chain as described, up to 23 mm in diameter. Possible early nodal disease in the right level 2 neck. Asymmetric fullness of the left tonsillar fossa, suspect squamous cell carcinoma primary.       06/28/2016 Pathology Results    Diagnosis Tonsil, biopsy, left - INVASIVE SQUAMOUS CELL CARCINOMA. - SEE COMMENT. Microscopic Comment The carcinoma appears moderately differentiated and is non-keratinizing. A p16 stain will be performed and the results reported separately The malignant cells are strongly and diffusely positive for p16.      07/05/2016 PET scan    1. Hypermetabolism in the region of the left palatini tonsil, consistent with known primary malignancy. 2. Hypermetabolic left-sided level II and III metastatic cervical lymphadenopathy. 3. 6 mm posterior left lower lobe pulmonary nodule. Attention on follow-up recommended as metastatic disease not excluded. 4. Cholelithiasis. 5. Nephrolithiasis. 6. Coronary artery and thoracoabdominal aortic atherosclerosis.      08/02/2016 Miscellaneous    Baseline hearing test is near normal      08/05/2016 Procedure    Fluoroscopic insertion  of a 20-French "pull-through" gastrostomy      08/05/2016 Procedure    Ultrasound and fluoroscopically guided right internal jugular single lumen power port catheter insertion. Tip in the SVC/RA junction. Catheter ready for use.      08/12/2016 -  Chemotherapy    Started chemo cisplatin      09/02/2016 Adverse Reaction    Second dose was delayed by patient no feeling well       INTERVAL HISTORY: Please see below for problem oriented charting. He is seen prior to cycle 2 of treatment. The patient cancelled 2nd treatment last week because of feeling unwell and mild fever. He had some nausea and poor appetite due to altered taste sensation. He has started to use feeding tube recently.  He also took liquid morphine sulfate but felt that it was inadequate to control his pain. He denies constipation  REVIEW OF SYSTEMS:   Eyes: Denies blurriness of vision Respiratory: Denies cough, dyspnea or wheezes Cardiovascular: Denies palpitation, chest discomfort or lower extremity swelling Skin: Denies abnormal skin rashes Lymphatics: Denies new lymphadenopathy or easy bruising Neurological:Denies numbness, tingling or new weaknesses Behavioral/Psych: Mood is stable, no new changes  All other systems were reviewed with the patient and are negative.  I have reviewed the past medical history, past surgical history, social history and family history with the patient and they are unchanged from previous note.  ALLERGIES:  has No Known Allergies.  MEDICATIONS:  Current Outpatient Prescriptions  Medication Sig Dispense Refill  . acetaminophen (TYLENOL) 325 MG tablet Take 650 mg by mouth every 6 (six) hours as needed. Pt states he takes 3 tabs when he takes    . fentaNYL (DURAGESIC - DOSED MCG/HR) 25 MCG/HR  patch Place 1 patch (25 mcg total) onto the skin every 3 (three) days. 5 patch 0  . lidocaine (XYLOCAINE) 2 % solution Patient: Mix 1part 2% viscous lidocaine, 1part H20. Swish & swallow 59mL of  diluted mixture, 61min before meals and at bedtime, up to QID 100 mL 5  . lidocaine-prilocaine (EMLA) cream Apply to affected area once 30 g 3  . Melatonin 10 MG TABS Take by mouth.    . Morphine Sulfate (MORPHINE CONCENTRATE) 10 mg / 0.5 ml concentrated solution Take 0.5 mLs (10 mg total) by mouth every 2 (two) hours as needed for severe pain. 240 mL 0  . Multiple Vitamin (MULTIVITAMIN) tablet Take 1 tablet by mouth daily.    . Omega-3 Fatty Acids (FISH OIL OMEGA-3 PO) Take by mouth.    . ondansetron (ZOFRAN) 8 MG tablet Take 1 tablet (8 mg total) by mouth 2 (two) times daily as needed. Start on the third day after chemotherapy. 30 tablet 1  . prochlorperazine (COMPAZINE) 10 MG tablet Take 1 tablet (10 mg total) by mouth every 6 (six) hours as needed (Nausea or vomiting). 30 tablet 1  . VALERIAN ROOT PO Take by mouth.     No current facility-administered medications for this visit.     PHYSICAL EXAMINATION: ECOG PERFORMANCE STATUS: 1 - Symptomatic but completely ambulatory  Vitals:   09/08/16 1342  BP: 123/85  Pulse: 100  Resp: 18  Temp: 98.7 F (37.1 C)   Filed Weights   09/08/16 1342  Weight: 189 lb 12.8 oz (86.1 kg)    GENERAL:alert, no distress and comfortable SKIN: Noted mild radiation induced skin changes around his neck EYES: normal, Conjunctiva are pink and non-injected, sclera clear OROPHARYNX: Noticed signs of mucositis.  No thrush NECK: supple, thyroid normal size, non-tender, without nodularity LYMPH: Previously palpable lymphadenopathy has reduced in size LUNGS: clear to auscultation and percussion with normal breathing effort HEART: regular rate & rhythm and no murmurs and no lower extremity edema ABDOMEN:abdomen soft, non-tender and normal bowel sounds Musculoskeletal:no cyanosis of digits and no clubbing  NEURO: alert & oriented x 3 with fluent speech, no focal motor/sensory deficits  LABORATORY DATA:  I have reviewed the data as listed    Component Value  Date/Time   NA 137 09/07/2016 1444   K 4.4 09/07/2016 1444   CL 105 08/05/2016 1159   CO2 27 09/07/2016 1444   GLUCOSE 85 09/07/2016 1444   BUN 19.5 09/07/2016 1444   CREATININE 0.9 09/07/2016 1444   CALCIUM 9.9 09/07/2016 1444   PROT 7.5 09/07/2016 1444   ALBUMIN 3.8 09/07/2016 1444   AST 18 09/07/2016 1444   ALT 26 09/07/2016 1444   ALKPHOS 42 09/07/2016 1444   BILITOT 0.38 09/07/2016 1444   GFRNONAA >60 08/05/2016 1159   GFRAA >60 08/05/2016 1159    No results found for: SPEP, UPEP  Lab Results  Component Value Date   WBC 5.1 09/07/2016   NEUTROABS 3.8 09/07/2016   HGB 13.9 09/07/2016   HCT 41.3 09/07/2016   MCV 88.4 09/07/2016   PLT 210 09/07/2016      Chemistry      Component Value Date/Time   NA 137 09/07/2016 1444   K 4.4 09/07/2016 1444   CL 105 08/05/2016 1159   CO2 27 09/07/2016 1444   BUN 19.5 09/07/2016 1444   CREATININE 0.9 09/07/2016 1444      Component Value Date/Time   CALCIUM 9.9 09/07/2016 1444   ALKPHOS 42 09/07/2016 1444  AST 18 09/07/2016 1444   ALT 26 09/07/2016 1444   BILITOT 0.38 09/07/2016 1444      ASSESSMENT & PLAN:  Cancer of tonsillar fossa ([place]) He continued to have mild side effects since last visit The patient cancelled his last treatment because he was not feeling well He felt he is ready to resume chemotherapy tomorrow I would proceed with same dose of treatment without dose adjustment  Mucositis due to antineoplastic therapy Recommend he takes prescription pain medicine as prescribed and I will reassess pain control next week  Nausea without vomiting I recommend he takes antiemetics.   Dysgeusia This is due to recent treatment. He has started using his feeding tube. I encouraged him to use it as tolerated   No orders of the defined types were placed in this encounter.  All questions were answered. The patient knows to call the clinic with any problems, questions or concerns. No barriers to learning was  detected. I spent 15 minutes counseling the patient face to face. The total time spent in the appointment was 20 minutes and more than 50% was on counseling and review of test results     Heath Lark, MD 09/08/2016 3:11 PM

## 2016-09-08 NOTE — Assessment & Plan Note (Signed)
He continued to have mild side effects since last visit The patient cancelled his last treatment because he was not feeling well He felt he is ready to resume chemotherapy tomorrow I would proceed with same dose of treatment without dose adjustment

## 2016-09-08 NOTE — Assessment & Plan Note (Signed)
This is due to recent treatment. He has started using his feeding tube. I encouraged him to use it as tolerated

## 2016-09-09 ENCOUNTER — Ambulatory Visit
Admission: RE | Admit: 2016-09-09 | Discharge: 2016-09-09 | Disposition: A | Discharge status: Home / Self Care | Payer: BLUE CROSS/BLUE SHIELD | Source: Ambulatory Visit | Attending: Radiation Oncology | Admitting: Radiation Oncology

## 2016-09-09 ENCOUNTER — Ambulatory Visit (HOSPITAL_BASED_OUTPATIENT_CLINIC_OR_DEPARTMENT_OTHER): Payer: BLUE CROSS/BLUE SHIELD

## 2016-09-09 VITALS — BP 126/89 | HR 85 | Temp 98.4°F | Resp 18

## 2016-09-09 DIAGNOSIS — C090 Malignant neoplasm of tonsillar fossa: Secondary | ICD-10-CM

## 2016-09-09 DIAGNOSIS — Z510 Encounter for antineoplastic radiation therapy: Secondary | ICD-10-CM | POA: Diagnosis not present

## 2016-09-09 DIAGNOSIS — Z5111 Encounter for antineoplastic chemotherapy: Secondary | ICD-10-CM | POA: Diagnosis not present

## 2016-09-09 MED ORDER — SODIUM CHLORIDE 0.9% FLUSH
10.0000 mL | INTRAVENOUS | Status: DC | PRN
  Administered 2016-09-09: 10 mL
  Filled 2016-09-09: qty 10

## 2016-09-09 MED ORDER — POTASSIUM CHLORIDE 2 MEQ/ML IV SOLN
Freq: Once | INTRAVENOUS | Status: AC
  Administered 2016-09-09: 09:00:00 via INTRAVENOUS
  Filled 2016-09-09: qty 10

## 2016-09-09 MED ORDER — PALONOSETRON HCL INJECTION 0.25 MG/5ML
INTRAVENOUS | Status: AC
  Filled 2016-09-09: qty 5

## 2016-09-09 MED ORDER — HEPARIN SOD (PORK) LOCK FLUSH 100 UNIT/ML IV SOLN
500.0000 [IU] | Freq: Once | INTRAVENOUS | Status: AC | PRN
  Administered 2016-09-09: 500 [IU]
  Filled 2016-09-09: qty 5

## 2016-09-09 MED ORDER — SODIUM CHLORIDE 0.9 % IV SOLN
Freq: Once | INTRAVENOUS | Status: AC
  Administered 2016-09-09: 09:00:00 via INTRAVENOUS

## 2016-09-09 MED ORDER — SODIUM CHLORIDE 0.9 % IV SOLN
Freq: Once | INTRAVENOUS | Status: AC
  Administered 2016-09-09: 12:00:00 via INTRAVENOUS
  Filled 2016-09-09: qty 5

## 2016-09-09 MED ORDER — PALONOSETRON HCL INJECTION 0.25 MG/5ML
0.2500 mg | Freq: Once | INTRAVENOUS | Status: AC
  Administered 2016-09-09: 0.25 mg via INTRAVENOUS

## 2016-09-09 MED ORDER — SODIUM CHLORIDE 0.9 % IV SOLN
95.0000 mg/m2 | Freq: Once | INTRAVENOUS | Status: AC
  Administered 2016-09-09: 200 mg via INTRAVENOUS
  Filled 2016-09-09: qty 200

## 2016-09-09 NOTE — Patient Instructions (Signed)
[facility] Discharge Instructions for Patients Receiving Chemotherapy  Today you received the following chemotherapy agents: Cisplatin   To help prevent nausea and vomiting after your treatment, we encourage you to take your nausea medication as directed.    If you develop nausea and vomiting that is not controlled by your nausea medication, call the clinic.   BELOW ARE SYMPTOMS THAT SHOULD BE REPORTED IMMEDIATELY:  *FEVER GREATER THAN 100.5 F  *CHILLS WITH OR WITHOUT FEVER  NAUSEA AND VOMITING THAT IS NOT CONTROLLED WITH YOUR NAUSEA MEDICATION  *UNUSUAL SHORTNESS OF BREATH  *UNUSUAL BRUISING OR BLEEDING  TENDERNESS IN MOUTH AND THROAT WITH OR WITHOUT PRESENCE OF ULCERS  *URINARY PROBLEMS  *BOWEL PROBLEMS  UNUSUAL RASH Items with * indicate a potential emergency and should be followed up as soon as possible.  Feel free to call the clinic you have any questions or concerns. The clinic phone number is [phone].  Please show the CHEMO ALERT CARD at check-in to the Emergency Department and triage nurse.   

## 2016-09-10 ENCOUNTER — Ambulatory Visit (HOSPITAL_BASED_OUTPATIENT_CLINIC_OR_DEPARTMENT_OTHER): Payer: BLUE CROSS/BLUE SHIELD

## 2016-09-10 VITALS — BP 130/86 | HR 96 | Temp 98.1°F | Resp 16

## 2016-09-10 DIAGNOSIS — C090 Malignant neoplasm of tonsillar fossa: Secondary | ICD-10-CM | POA: Diagnosis not present

## 2016-09-10 DIAGNOSIS — E860 Dehydration: Secondary | ICD-10-CM | POA: Diagnosis not present

## 2016-09-10 MED ORDER — HEPARIN SOD (PORK) LOCK FLUSH 100 UNIT/ML IV SOLN
500.0000 [IU] | Freq: Once | INTRAVENOUS | Status: AC | PRN
  Administered 2016-09-10: 500 [IU]
  Filled 2016-09-10: qty 5

## 2016-09-10 MED ORDER — SODIUM CHLORIDE 0.9 % IV SOLN
Freq: Once | INTRAVENOUS | Status: AC
  Administered 2016-09-10: 08:00:00 via INTRAVENOUS

## 2016-09-10 MED ORDER — SODIUM CHLORIDE 0.9% FLUSH
10.0000 mL | INTRAVENOUS | Status: DC | PRN
Start: 2016-09-10 — End: 2016-09-10
  Administered 2016-09-10: 10 mL
  Filled 2016-09-10: qty 10

## 2016-09-10 MED ORDER — SODIUM CHLORIDE 0.9 % IV SOLN: Freq: Once | INTRAVENOUS | Status: DC

## 2016-09-10 NOTE — Patient Instructions (Signed)
Dehydration, Adult Dehydration is when there is not enough fluid or water in your body. This happens when you lose more fluids than you take in. Dehydration can range from mild to very bad. It should be treated right away to keep it from getting very bad. Symptoms of mild dehydration may include:  Thirst.  Dry lips.  Slightly dry mouth.  Dry, warm skin.  Dizziness. Symptoms of moderate dehydration may include:  Very dry mouth.  Muscle cramps.  Dark pee (urine). Pee may be the color of tea.  Your body making less pee.  Your eyes making fewer tears.  Heartbeat that is uneven or faster than normal (palpitations).  Headache.  Light-headedness, especially when you stand up from sitting.  Fainting (syncope). Symptoms of very bad dehydration may include:  Changes in skin, such as: ? Cold and clammy skin. ? Blotchy (mottled) or pale skin. ? Skin that does not quickly return to normal after being lightly pinched and let go (poor skin turgor).  Changes in body fluids, such as: ? Feeling very thirsty. ? Your eyes making fewer tears. ? Not sweating when body temperature is high, such as in hot weather. ? Your body making very little pee.  Changes in vital signs, such as: ? Weak pulse. ? Pulse that is more than 100 beats a minute when you are sitting still. ? Fast breathing. ? Low blood pressure.  Other changes, such as: ? Sunken eyes. ? Cold hands and feet. ? Confusion. ? Lack of energy (lethargy). ? Trouble waking up from sleep. ? Short-term weight loss. ? Unconsciousness. Follow these instructions at home:  If told by your doctor, drink an ORS: ? Make an ORS by using instructions on the package. ? Start by drinking small amounts, about  cup (120 mL) every 5-10 minutes. ? Slowly drink more until you have had the amount that your doctor said to have.  Drink enough clear fluid to keep your pee clear or pale yellow. If you were told to drink an ORS, finish the ORS  first, then start slowly drinking clear fluids. Drink fluids such as: ? Water. Do not drink only water by itself. Doing that can make the salt (sodium) level in your body get too low (hyponatremia). ? Ice chips. ? Fruit juice that you have added water to (diluted). ? Low-calorie sports drinks.  Avoid: ? Alcohol. ? Drinks that have a lot of sugar. These include high-calorie sports drinks, fruit juice that does not have water added, and soda. ? Caffeine. ? Foods that are greasy or have a lot of fat or sugar.  Take over-the-counter and prescription medicines only as told by your doctor.  Do not take salt tablets. Doing that can make the salt level in your body get too high (hypernatremia).  Eat foods that have minerals (electrolytes). Examples include bananas, oranges, potatoes, tomatoes, and spinach.  Keep all follow-up visits as told by your doctor. This is important. Contact a doctor if:  You have belly (abdominal) pain that: ? Gets worse. ? Stays in one area (localizes).  You have a rash.  You have a stiff neck.  You get angry or annoyed more easily than normal (irritability).  You are more sleepy than normal.  You have a harder time waking up than normal.  You feel: ? Weak. ? Dizzy. ? Very thirsty.  You have peed (urinated) only a small amount of very dark pee during 6-8 hours. Get help right away if:  You have symptoms of   very bad dehydration.  You cannot drink fluids without throwing up (vomiting).  Your symptoms get worse with treatment.  You have a fever.  You have a very bad headache.  You are throwing up or having watery poop (diarrhea) and it: ? Gets worse. ? Does not go away.  You have blood or something green (bile) in your throw-up.  You have blood in your poop (stool). This may cause poop to look black and tarry.  You have not peed in 6-8 hours.  You pass out (faint).  Your heart rate when you are sitting still is more than 100 beats a  minute.  You have trouble breathing. This information is not intended to replace advice given to you by your health care provider. Make sure you discuss any questions you have with your health care provider. Document Released: 01/15/2009 Document Revised: 10/09/2015 Document Reviewed: 05/15/2015 Elsevier Interactive Patient Education  2018 [employer]  

## 2016-09-12 ENCOUNTER — Ambulatory Visit
Admission: RE | Admit: 2016-09-12 | Discharge: 2016-09-12 | Disposition: A | Discharge status: Home / Self Care | Payer: BLUE CROSS/BLUE SHIELD | Source: Ambulatory Visit | Attending: Radiation Oncology | Admitting: Radiation Oncology

## 2016-09-12 ENCOUNTER — Encounter: Payer: Self-pay | Admitting: Radiation Oncology

## 2016-09-12 VITALS — BP 141/99 | HR 93 | Temp 98.7°F | Ht 69.0 in | Wt 189.0 lb

## 2016-09-12 DIAGNOSIS — C090 Malignant neoplasm of tonsillar fossa: Secondary | ICD-10-CM

## 2016-09-12 DIAGNOSIS — Z510 Encounter for antineoplastic radiation therapy: Secondary | ICD-10-CM | POA: Diagnosis not present

## 2016-09-12 NOTE — Progress Notes (Signed)
Douglas Norris presents for his 22nd fraction of radiation to his Left Tonsil. He denies pain. He had chemotherapy on Friday and has felt badly since then. He has had nausea/ vomiting. He feels like this is due to chemotherapy and thick saliva which causes him to gag. His neck is red/hyperpigmented and he is using biafine twice daily. He is eating softer foods. He is instilling 5-6 cans daily of nutrition through his feeding tube. He has not eaten or used his feeding tube much since Friday due to effects from chemotherapy. He cannot take zofran until 3 days after chemotherapy per order, and feels like this is the medicine that helps him the most.   BP (!) 141/99   Pulse 93   Temp 98.7 F (37.1 C)   Ht 5\' 9"  (1.753 m)   Wt 189 lb (85.7 kg)   SpO2 97% Comment: room air  BMI 27.91 kg/m    Wt Readings from Last 3 Encounters:  09/12/16 189 lb (85.7 kg)  09/08/16 189 lb 12.8 oz (86.1 kg)  09/05/16 189 lb (85.7 kg)

## 2016-09-13 ENCOUNTER — Ambulatory Visit
Admission: RE | Admit: 2016-09-13 | Discharge: 2016-09-13 | Disposition: A | Discharge status: Home / Self Care | Payer: BLUE CROSS/BLUE SHIELD | Source: Ambulatory Visit | Attending: Radiation Oncology | Admitting: Radiation Oncology

## 2016-09-13 DIAGNOSIS — Z510 Encounter for antineoplastic radiation therapy: Secondary | ICD-10-CM | POA: Diagnosis not present

## 2016-09-13 NOTE — Progress Notes (Signed)
   Weekly Management Note:  outpatient    ICD-10-CM   1. Cancer of tonsillar fossa (HCC) C09.0     Current Dose: 44 Gy  Projected Dose: 70 Gy   Narrative:  The patient presents for routine under treatment assessment.  CBCT/MVCT images/Port film x-rays were reviewed.  The chart was checked.  Weight stable. Main complaint is nausea r/t recent chemotherapy - had meds to temper this.  Thick saliva.   Physical Findings:  Wt Readings from Last 3 Encounters:  09/12/16 189 lb (85.7 kg)  09/08/16 189 lb 12.8 oz (86.1 kg)  09/05/16 189 lb (85.7 kg)    height is 5\' 9"  (1.753 m) and weight is 189 lb (85.7 kg). His temperature is 98.7 F (37.1 C). His blood pressure is 141/99 (abnormal) and his pulse is 93. His oxygen saturation is 97%.   Patchy mucositis with erythema in the throat. Mucous membranes moist. Skin over neck is dry/erythematous but intact. Still a palpable left upper neck mass.    CBC    Component Value Date/Time   WBC 5.1 09/07/2016 1444   WBC 8.7 08/05/2016 1159   RBC 4.67 09/07/2016 1444   RBC 4.88 08/05/2016 1159   HGB 13.9 09/07/2016 1444   HCT 41.3 09/07/2016 1444   PLT 210 09/07/2016 1444   MCV 88.4 09/07/2016 1444   MCH 29.8 09/07/2016 1444   MCH 30.1 08/05/2016 1159   MCHC 33.7 09/07/2016 1444   MCHC 33.7 08/05/2016 1159   RDW 13.4 09/07/2016 1444   LYMPHSABS 0.3 (L) 09/07/2016 1444   MONOABS 0.8 09/07/2016 1444   EOSABS 0.1 09/07/2016 1444   BASOSABS 0.0 09/07/2016 1444     CMP     Component Value Date/Time   NA 137 09/07/2016 1444   K 4.4 09/07/2016 1444   CL 105 08/05/2016 1159   CO2 27 09/07/2016 1444   GLUCOSE 85 09/07/2016 1444   BUN 19.5 09/07/2016 1444   CREATININE 0.9 09/07/2016 1444   CALCIUM 9.9 09/07/2016 1444   PROT 7.5 09/07/2016 1444   ALBUMIN 3.8 09/07/2016 1444   AST 18 09/07/2016 1444   ALT 26 09/07/2016 1444   ALKPHOS 42 09/07/2016 1444   BILITOT 0.38 09/07/2016 1444   GFRNONAA >60 08/05/2016 1159   GFRAA >60 08/05/2016 1159      Impression:  The patient is tolerating radiotherapy   Plan:  Continue radiotherapy as planned.     Patient declines further interventions for thick saliva. Use Baking soda gargles PRN. -----------------------------------  Eppie Gibson, MD

## 2016-09-14 ENCOUNTER — Other Ambulatory Visit (HOSPITAL_BASED_OUTPATIENT_CLINIC_OR_DEPARTMENT_OTHER): Payer: BLUE CROSS/BLUE SHIELD

## 2016-09-14 ENCOUNTER — Ambulatory Visit
Admission: RE | Admit: 2016-09-14 | Discharge: 2016-09-14 | Disposition: A | Discharge status: Home / Self Care | Payer: BLUE CROSS/BLUE SHIELD | Source: Ambulatory Visit | Attending: Radiation Oncology | Admitting: Radiation Oncology

## 2016-09-14 DIAGNOSIS — Z510 Encounter for antineoplastic radiation therapy: Secondary | ICD-10-CM | POA: Diagnosis not present

## 2016-09-14 DIAGNOSIS — C090 Malignant neoplasm of tonsillar fossa: Secondary | ICD-10-CM | POA: Diagnosis not present

## 2016-09-14 LAB — COMPREHENSIVE METABOLIC PANEL
ALBUMIN: 3.7 g/dL (ref 3.5–5.0)
ALT: 52 U/L (ref 0–55)
AST: 28 U/L (ref 5–34)
Alkaline Phosphatase: 42 U/L (ref 40–150)
Anion Gap: 11 mEq/L (ref 3–11)
BUN: 25.6 mg/dL (ref 7.0–26.0)
CHLORIDE: 97 meq/L — AB (ref 98–109)
CO2: 28 mEq/L (ref 22–29)
CREATININE: 1.2 mg/dL (ref 0.7–1.3)
Calcium: 9.7 mg/dL (ref 8.4–10.4)
EGFR: 63 mL/min/{1.73_m2} — ABNORMAL LOW (ref >90)
GLUCOSE: 111 mg/dL (ref 70–140)
POTASSIUM: 3.6 meq/L (ref 3.5–5.1)
SODIUM: 137 meq/L (ref 136–145)
Total Bilirubin: 0.45 mg/dL (ref 0.20–1.20)
Total Protein: 7.3 g/dL (ref 6.4–8.3)

## 2016-09-14 LAB — CBC WITH DIFFERENTIAL/PLATELET
BASO%: 0.2 % (ref 0.0–2.0)
BASOS ABS: 0 10*3/uL (ref 0.0–0.1)
EOS%: 1 % (ref 0.0–7.0)
Eosinophils Absolute: 0.1 10*3/uL (ref 0.0–0.5)
HEMATOCRIT: 39.5 % (ref 38.4–49.9)
HEMOGLOBIN: 13.8 g/dL (ref 13.0–17.1)
LYMPH#: 0.7 10*3/uL — AB (ref 0.9–3.3)
LYMPH%: 14.3 % (ref 14.0–49.0)
MCH: 30.5 pg (ref 27.2–33.4)
MCHC: 34.9 g/dL (ref 32.0–36.0)
MCV: 87.4 fL (ref 79.3–98.0)
MONO#: 0.3 10*3/uL (ref 0.1–0.9)
MONO%: 6.7 % (ref 0.0–14.0)
NEUT#: 3.8 10*3/uL (ref 1.5–6.5)
NEUT%: 77.8 % — ABNORMAL HIGH (ref 39.0–75.0)
Platelets: 290 10*3/uL (ref 140–400)
RBC: 4.52 10*6/uL (ref 4.20–5.82)
RDW: 13.3 % (ref 11.0–14.6)
WBC: 4.9 10*3/uL (ref 4.0–10.3)

## 2016-09-14 LAB — MAGNESIUM: Magnesium: 2.2 mg/dl (ref 1.5–2.5)

## 2016-09-15 ENCOUNTER — Encounter: Payer: Self-pay | Admitting: Hematology and Oncology

## 2016-09-15 ENCOUNTER — Ambulatory Visit: Payer: BLUE CROSS/BLUE SHIELD | Admitting: Nutrition

## 2016-09-15 ENCOUNTER — Ambulatory Visit
Admission: RE | Admit: 2016-09-15 | Discharge: 2016-09-15 | Disposition: A | Discharge status: Home / Self Care | Payer: BLUE CROSS/BLUE SHIELD | Source: Ambulatory Visit | Attending: Radiation Oncology | Admitting: Radiation Oncology

## 2016-09-15 ENCOUNTER — Ambulatory Visit (HOSPITAL_BASED_OUTPATIENT_CLINIC_OR_DEPARTMENT_OTHER): Payer: BLUE CROSS/BLUE SHIELD | Admitting: Hematology and Oncology

## 2016-09-15 VITALS — BP 137/97 | HR 112 | Temp 99.4°F | Resp 20 | Ht 69.0 in | Wt 181.0 lb

## 2016-09-15 DIAGNOSIS — J3489 Other specified disorders of nose and nasal sinuses: Secondary | ICD-10-CM | POA: Diagnosis not present

## 2016-09-15 DIAGNOSIS — Z510 Encounter for antineoplastic radiation therapy: Secondary | ICD-10-CM | POA: Diagnosis not present

## 2016-09-15 DIAGNOSIS — C090 Malignant neoplasm of tonsillar fossa: Secondary | ICD-10-CM | POA: Diagnosis not present

## 2016-09-15 DIAGNOSIS — K1231 Oral mucositis (ulcerative) due to antineoplastic therapy: Secondary | ICD-10-CM | POA: Diagnosis not present

## 2016-09-15 DIAGNOSIS — R432 Parageusia: Secondary | ICD-10-CM

## 2016-09-15 DIAGNOSIS — R634 Abnormal weight loss: Secondary | ICD-10-CM

## 2016-09-15 DIAGNOSIS — R110 Nausea: Secondary | ICD-10-CM | POA: Diagnosis not present

## 2016-09-15 MED ORDER — SCOPOLAMINE 1 MG/3DAYS TD PT72: 1.0000 | MEDICATED_PATCH | TRANSDERMAL | 12 refills | Status: DC

## 2016-09-15 MED ORDER — TRIAMCINOLONE ACETONIDE 55 MCG/ACT NA AERO: 2.0000 | INHALATION_SPRAY | Freq: Every day | NASAL | 12 refills | Status: DC

## 2016-09-15 NOTE — Assessment & Plan Note (Signed)
I recommend he takes antiemetics. I recommend a trial of scopolamine patch He declined IV antiemetics We can try Reglan before meals if scopolamine patch is not helpful

## 2016-09-15 NOTE — Progress Notes (Signed)
Nutrition follow-up completed with patient receiving radiation therapy and chemotherapy for tonsil cancer. Weight decreased and documented as 181 pounds on June 14, down from 189 pounds on June 11. Patient reports he is unable to keep food and liquids down secondary to sinus drainage. He is gagging when he moves around and his "sinuses drain into his throat". He denies nausea. Patient reports he has refused IV fluids.  Nutrition diagnosis: Inadequate oral intake continues.  Intervention: Educated patient to take new medications as prescribed by physician. Encouraged patient to sit in a position that decreases gagging after giving tube feeding. Stressed the importance of increased hydration and encouraged patient to consider IV fluids if he is unable to increase oral intake. Educated patient about continuous tube feeding however, he was not interested. Teach back method was used.  Monitoring, evaluation, goals:  Patient will work to increase calories and protein as well as fluids to minimize weight loss and dehydration.  Next visit: Thursday, June 21.  After radiation therapy.  **Disclaimer: This note was dictated with voice recognition software. Similar sounding words can inadvertently be transcribed and this note may contain transcription errors which may not have been corrected upon publication of note.**

## 2016-09-15 NOTE — Assessment & Plan Note (Signed)
This is due to recent treatment.

## 2016-09-15 NOTE — Assessment & Plan Note (Signed)
The patient is clearly aggravated by unpleasant side effects from treatment Unfortunately, there is no antidote to reverse his symptoms I advocate for aggressive supportive care I offer him daily IV fluids but the patient declined I will continue to see him on a weekly basis The patient has made informed decision not to pursue further chemotherapy

## 2016-09-15 NOTE — Assessment & Plan Note (Signed)
He had recent mild weight loss due to dysgeusia, mucus gagging and postprandial vomiting I recommend IV fluid support, IV antiemetics and he will see a nutritionist on a weekly basis The patient declined IV fluid support

## 2016-09-15 NOTE — Assessment & Plan Note (Signed)
Recommend he takes prescription pain medicine as prescribed and I will reassess pain control next week

## 2016-09-15 NOTE — Progress Notes (Signed)
[place] OFFICE PROGRESS NOTE  Patient Care Team: Patient, No Pcp Per as PCP - General (General Practice) Rozetta Nunnery, MD as Consulting Physician (Otolaryngology) Eppie Gibson, MD as Attending Physician (Radiation Oncology) Heath Lark, MD as Consulting Physician (Hematology and Oncology) Lenn Cal, DDS as Consulting Physician (Dentistry) Leota Sauers, RN as Oncology Nurse Navigator Karie Mainland, RD as Dietitian (Nutrition) Valentino Saxon Perry Mount, CCC-SLP as Speech Language Pathologist (Speech Pathology) Jomarie Longs, PT as Physical Therapist (Physical Therapy) Kennith Center, LCSW as Social Worker  SUMMARY OF ONCOLOGIC HISTORY:   Cancer of tonsillar fossa ([place])   06/27/2016 Imaging    Ct neck: Multiple malignant appearing lymph nodes in the left cervical chain as described, up to 23 mm in diameter. Possible early nodal disease in the right level 2 neck. Asymmetric fullness of the left tonsillar fossa, suspect squamous cell carcinoma primary.       06/28/2016 Pathology Results    Diagnosis Tonsil, biopsy, left - INVASIVE SQUAMOUS CELL CARCINOMA. - SEE COMMENT. Microscopic Comment The carcinoma appears moderately differentiated and is non-keratinizing. A p16 stain will be performed and the results reported separately The malignant cells are strongly and diffusely positive for p16.      07/05/2016 PET scan    1. Hypermetabolism in the region of the left palatini tonsil, consistent with known primary malignancy. 2. Hypermetabolic left-sided level II and III metastatic cervical lymphadenopathy. 3. 6 mm posterior left lower lobe pulmonary nodule. Attention on follow-up recommended as metastatic disease not excluded. 4. Cholelithiasis. 5. Nephrolithiasis. 6. Coronary artery and thoracoabdominal aortic atherosclerosis.      08/02/2016 Miscellaneous    Baseline hearing test is near normal      08/05/2016 Procedure    Fluoroscopic insertion  of a 20-French "pull-through" gastrostomy      08/05/2016 Procedure    Ultrasound and fluoroscopically guided right internal jugular single lumen power port catheter insertion. Tip in the SVC/RA junction. Catheter ready for use.      08/12/2016 -  Chemotherapy    Started chemo cisplatin      09/02/2016 Adverse Reaction    Second dose was delayed by patient no feeling well       INTERVAL HISTORY: Please see below for problem oriented charting. The patient does not feel well He has lost 9 pounds He has nasal drainage and mucus gagging causing a lot of nausea and vomiting He cannot tolerate nutritional supplement due to postprandial vomiting He made statements today such as "you pump poison in me and caused all these mucus". "You made me sick". He took liquid morphine sulfate for mucositis pain.  That is well controlled  REVIEW OF SYSTEMS:   Constitutional: Denies fevers, chills  Eyes: Denies blurriness of vision Respiratory: Denies cough, dyspnea or wheezes Cardiovascular: Denies palpitation, chest discomfort or lower extremity swelling Skin: Denies abnormal skin rashes Lymphatics: Denies new lymphadenopathy or easy bruising Neurological:Denies numbness, tingling or new weaknesses Behavioral/Psych: Mood is stable, no new changes  All other systems were reviewed with the patient and are negative.  I have reviewed the past medical history, past surgical history, social history and family history with the patient and they are unchanged from previous note.  ALLERGIES:  has No Known Allergies.  MEDICATIONS:  Current Outpatient Prescriptions  Medication Sig Dispense Refill  . acetaminophen (TYLENOL) 325 MG tablet Take 650 mg by mouth every 6 (six) hours as needed. Pt states he takes 3 tabs when he takes    .  fentaNYL (DURAGESIC - DOSED MCG/HR) 25 MCG/HR patch Place 1 patch (25 mcg total) onto the skin every 3 (three) days. (Patient not taking: Reported on 09/12/2016) 5 patch 0  .  lidocaine (XYLOCAINE) 2 % solution Patient: Mix 1part 2% viscous lidocaine, 1part H20. Swish & swallow 62mL of diluted mixture, 12min before meals and at bedtime, up to QID 100 mL 5  . lidocaine-prilocaine (EMLA) cream Apply to affected area once 30 g 3  . Melatonin 10 MG TABS Take by mouth.    . Morphine Sulfate (MORPHINE CONCENTRATE) 10 mg / 0.5 ml concentrated solution Take 0.5 mLs (10 mg total) by mouth every 2 (two) hours as needed for severe pain. 240 mL 0  . Multiple Vitamin (MULTIVITAMIN) tablet Take 1 tablet by mouth daily.    . Omega-3 Fatty Acids (FISH OIL OMEGA-3 PO) Take by mouth.    . ondansetron (ZOFRAN) 8 MG tablet Take 1 tablet (8 mg total) by mouth 2 (two) times daily as needed. Start on the third day after chemotherapy. 30 tablet 1  . prochlorperazine (COMPAZINE) 10 MG tablet Take 1 tablet (10 mg total) by mouth every 6 (six) hours as needed (Nausea or vomiting). 30 tablet 1  . scopolamine (TRANSDERM-SCOP) 1 MG/3DAYS Place 1 patch (1.5 mg total) onto the skin every 3 (three) days. 10 patch 12  . triamcinolone (NASACORT) 55 MCG/ACT AERO nasal inhaler Place 2 sprays into the nose daily. 1 Inhaler 12  . VALERIAN ROOT PO Take by mouth.     No current facility-administered medications for this visit.     PHYSICAL EXAMINATION: ECOG PERFORMANCE STATUS: 1 - Symptomatic but completely ambulatory  Vitals:   09/15/16 1407  BP: (!) 137/97  Pulse: (!) 112  Resp: 20  Temp: 99.4 F (37.4 C)   Filed Weights   09/15/16 1407  Weight: 181 lb (82.1 kg)    GENERAL:alert, no distress and comfortable SKIN: He had skin redness, no ulceration or bleeding EYES: normal, Conjunctiva are pink and non-injected, sclera clear OROPHARYNX: Noted mucositis.  No thrush Musculoskeletal:no cyanosis of digits and no clubbing  NEURO: alert & oriented x 3 with fluent speech, no focal motor/sensory deficits  LABORATORY DATA:  I have reviewed the data as listed    Component Value Date/Time   NA 137  09/14/2016 1424   K 3.6 09/14/2016 1424   CL 105 08/05/2016 1159   CO2 28 09/14/2016 1424   GLUCOSE 111 09/14/2016 1424   BUN 25.6 09/14/2016 1424   CREATININE 1.2 09/14/2016 1424   CALCIUM 9.7 09/14/2016 1424   PROT 7.3 09/14/2016 1424   ALBUMIN 3.7 09/14/2016 1424   AST 28 09/14/2016 1424   ALT 52 09/14/2016 1424   ALKPHOS 42 09/14/2016 1424   BILITOT 0.45 09/14/2016 1424   GFRNONAA >60 08/05/2016 1159   GFRAA >60 08/05/2016 1159    No results found for: SPEP, UPEP  Lab Results  Component Value Date   WBC 4.9 09/14/2016   NEUTROABS 3.8 09/14/2016   HGB 13.8 09/14/2016   HCT 39.5 09/14/2016   MCV 87.4 09/14/2016   PLT 290 09/14/2016      Chemistry      Component Value Date/Time   NA 137 09/14/2016 1424   K 3.6 09/14/2016 1424   CL 105 08/05/2016 1159   CO2 28 09/14/2016 1424   BUN 25.6 09/14/2016 1424   CREATININE 1.2 09/14/2016 1424      Component Value Date/Time   CALCIUM 9.7 09/14/2016 1424   ALKPHOS  42 09/14/2016 1424   AST 28 09/14/2016 1424   ALT 52 09/14/2016 1424   BILITOT 0.45 09/14/2016 1424       ASSESSMENT & PLAN:  Cancer of tonsillar fossa (HCC) The patient is clearly aggravated by unpleasant side effects from treatment Unfortunately, there is no antidote to reverse his symptoms I advocate for aggressive supportive care I offer him daily IV fluids but the patient declined I will continue to see him on a weekly basis The patient has made informed decision not to pursue further chemotherapy  Mucositis due to antineoplastic therapy Recommend he takes prescription pain medicine as prescribed and I will reassess pain control next week  Dysgeusia This is due to recent treatment.  Weight loss He had recent mild weight loss due to dysgeusia, mucus gagging and postprandial vomiting I recommend IV fluid support, IV antiemetics and he will see a nutritionist on a weekly basis The patient declined IV fluid support  Nausea without vomiting I  recommend he takes antiemetics. I recommend a trial of scopolamine patch He declined IV antiemetics We can try Reglan before meals if scopolamine patch is not helpful   No orders of the defined types were placed in this encounter.  All questions were answered. The patient knows to call the clinic with any problems, questions or concerns. No barriers to learning was detected. I spent 15 minutes counseling the patient face to face. The total time spent in the appointment was 20 minutes and more than 50% was on counseling and review of test results     Heath Lark, MD 09/15/2016 2:41 PM

## 2016-09-15 NOTE — Assessment & Plan Note (Signed)
Could be related to side effects of treatment I recommend a trial of Nasacort to reduce nasal drainage

## 2016-09-16 ENCOUNTER — Encounter (HOSPITAL_COMMUNITY): Payer: Self-pay | Admitting: Nurse Practitioner

## 2016-09-16 ENCOUNTER — Emergency Department (HOSPITAL_COMMUNITY)
Admission: EM | Admit: 2016-09-16 | Discharge: 2016-09-17 | Disposition: A | Discharge status: Home / Self Care | Payer: BLUE CROSS/BLUE SHIELD | Attending: Emergency Medicine | Admitting: Emergency Medicine

## 2016-09-16 ENCOUNTER — Ambulatory Visit
Admission: RE | Admit: 2016-09-16 | Discharge: 2016-09-16 | Disposition: A | Discharge status: Home / Self Care | Payer: BLUE CROSS/BLUE SHIELD | Source: Ambulatory Visit | Attending: Radiation Oncology | Admitting: Radiation Oncology

## 2016-09-16 ENCOUNTER — Encounter: Payer: Self-pay | Admitting: Hematology and Oncology

## 2016-09-16 ENCOUNTER — Other Ambulatory Visit: Payer: Self-pay

## 2016-09-16 DIAGNOSIS — E860 Dehydration: Secondary | ICD-10-CM | POA: Insufficient documentation

## 2016-09-16 DIAGNOSIS — Z87891 Personal history of nicotine dependence: Secondary | ICD-10-CM | POA: Insufficient documentation

## 2016-09-16 DIAGNOSIS — Z79899 Other long term (current) drug therapy: Secondary | ICD-10-CM | POA: Diagnosis not present

## 2016-09-16 DIAGNOSIS — R531 Weakness: Secondary | ICD-10-CM | POA: Diagnosis not present

## 2016-09-16 DIAGNOSIS — Z510 Encounter for antineoplastic radiation therapy: Secondary | ICD-10-CM | POA: Diagnosis not present

## 2016-09-16 DIAGNOSIS — R5383 Other fatigue: Secondary | ICD-10-CM | POA: Diagnosis present

## 2016-09-16 LAB — I-STAT CHEM 8, ED
BUN: 26 mg/dL — ABNORMAL HIGH (ref 6–20)
Calcium, Ion: 1.01 mmol/L — ABNORMAL LOW (ref 1.15–1.40)
Chloride: 92 mmol/L — ABNORMAL LOW (ref 101–111)
Creatinine, Ser: 1 mg/dL (ref 0.61–1.24)
Glucose, Bld: 115 mg/dL — ABNORMAL HIGH (ref 65–99)
HCT: 38 % — ABNORMAL LOW (ref 39.0–52.0)
Hemoglobin: 12.9 g/dL — ABNORMAL LOW (ref 13.0–17.0)
Potassium: 3.6 mmol/L (ref 3.5–5.1)
Sodium: 130 mmol/L — ABNORMAL LOW (ref 135–145)
TCO2: 28 mmol/L (ref 0–100)

## 2016-09-16 MED ORDER — SODIUM CHLORIDE 0.9 % IV BOLUS (SEPSIS)
1000.0000 mL | Freq: Once | INTRAVENOUS | Status: AC
  Administered 2016-09-16: 1000 mL via INTRAVENOUS

## 2016-09-16 MED ORDER — PROMETHAZINE HCL 25 MG PO TABS: 12.5000 mg | ORAL_TABLET | Freq: Once | ORAL | Status: DC

## 2016-09-16 MED ORDER — ONDANSETRON 4 MG PO TBDP
4.0000 mg | ORAL_TABLET | Freq: Once | ORAL | Status: AC
  Administered 2016-09-16: 4 mg via ORAL
  Filled 2016-09-16: qty 1

## 2016-09-16 NOTE — ED Triage Notes (Signed)
Pt states he had been advised to stay [facility] but was unable to, states he is now feeling the effects of dehydration.

## 2016-09-16 NOTE — Progress Notes (Signed)
Received PA request for Transderm-Scop patches.  Called [facility] pharmacy help desk(Jennifer) to initiate PA. Answered clinical questions. She marked as urgent and advised decision will be sent via fax within 72 hours.

## 2016-09-16 NOTE — ED Provider Notes (Signed)
[facility] DEPT Provider Note   CSN: [id number] Arrival date & time: 09/16/16  2221  By signing my name below, I, Ny'Kea Lewis, attest that this documentation has been prepared under the direction and in the presence of Deno Etienne, DO. Electronically Signed: Lise Auer, ED Scribe. 09/17/16. 12:50 AM.  History   Chief Complaint Chief Complaint  Patient presents with  . Fatigue  . Dehydration   The history is provided by the patient. No language interpreter was used.    HPI Comments: Beaumont Austad is a 61 y.o. male with a PMHx of tonsillar fossa cancer, who presents to the Emergency Department complaining of persistent fatigue and weakness for the past week. Pt notes associated nausea and vomiting. He reports he normally has these episodes secondary to this chemotherapy. Today he was been seen for radiation and they decided to started IVF but pt denied. After returning home his symptoms worsened. Pt has Zofran and Compazine for nausea and notes no relief. He currently is nauseous. Denies diarrhea, fever, chills, congestion, shortness of breath, chest pain, or headaches.   Past Medical History:  Diagnosis Date  . Heart murmur    as a child  . 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 Nasal drainage 09/15/2016  . Nausea without vomiting 09/08/2016  . Dysgeusia 09/08/2016  . Leukopenia due to antineoplastic chemotherapy ([place]) 09/01/2016  . Mucositis due to antineoplastic therapy 08/25/2016  . Weight loss 08/18/2016  . 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 Cancer of tonsillar fossa ([place]) 07/08/2016   Past Surgical History:  Procedure Laterality Date  . APPENDECTOMY  03/02/12  . IR FLUORO GUIDE PORT INSERTION RIGHT  08/05/2016  . IR GASTROSTOMY TUBE MOD SED  08/05/2016  . IR US GUIDE VASC ACCESS RIGHT  08/05/2016  . LAPAROSCOPIC APPENDECTOMY  03/02/2012   Procedure: APPENDECTOMY  LAPAROSCOPIC;  Surgeon: Joyice Faster. Cornett, MD;  Location: [facility];  Service: General;  Laterality: N/A;  . WISDOM TOOTH EXTRACTION     lower molars bilat    Home Medications    Prior to Admission medications   Medication Sig Start Date End Date Taking? Authorizing Provider  acetaminophen (TYLENOL) 325 MG tablet Take 650 mg by mouth every 6 (six) hours as needed for mild pain or headache.    Yes [provider]  lidocaine-prilocaine (EMLA) cream Apply to affected area once 08/09/16  Yes Gorsuch, Ni, MD  Melatonin 10 MG TABS Take 10 mg by mouth at bedtime as needed (sleep).    Yes [provider]  Morphine Sulfate (MORPHINE CONCENTRATE) 10 mg / 0.5 ml concentrated solution Take 0.5 mLs (10 mg total) by mouth every 2 (two) hours as needed for severe pain. 08/25/16  Yes Heath Lark, MD  Multiple Vitamin (MULTIVITAMIN) tablet Take 1 tablet by mouth daily.   Yes [provider]  Omega-3 Fatty Acids (FISH OIL OMEGA-3 PO) Take by mouth.   Yes [provider]  ondansetron (ZOFRAN) 8 MG tablet Take 1 tablet (8 mg total) by mouth 2 (two) times daily as needed. Start on the third day after chemotherapy. 08/09/16  Yes Heath Lark, MD  prochlorperazine (COMPAZINE) 10 MG tablet Take 1 tablet (10 mg total) by mouth every 6 (six) hours as needed (Nausea or vomiting). 08/09/16  Yes Gorsuch, Ni, MD  triamcinolone (NASACORT) 55 MCG/ACT AERO nasal inhaler Place 2 sprays into the nose daily. 09/15/16  Yes  Heath Lark, MD  VALERIAN ROOT PO Take 1 tablet by mouth daily.    Yes [provider]  fentaNYL (DURAGESIC - DOSED MCG/HR) 25 MCG/HR patch Place 1 patch (25 mcg total) onto the skin every 3 (three) days. Patient not taking: Reported on 09/12/2016 08/25/16   Heath Lark, MD  lidocaine (XYLOCAINE) 2 % solution Patient: Mix 1part 2% viscous lidocaine, 1part H20. Swish & swallow 36mL of diluted mixture, 47min before meals and at bedtime, up to QID Patient not taking: Reported on  09/17/2016 08/22/16   Eppie Gibson, MD  scopolamine (TRANSDERM-SCOP) 1 MG/3DAYS Place 1 patch (1.5 mg total) onto the skin every 3 (three) days. 09/15/16   Heath Lark, MD   Family History Family History  Problem Relation Age of Onset  . Cancer Mother        breast  . Cancer Father        pancreatic  . Cancer Sister        breast  . Cancer Sister        breast   Social History Social History  Substance Use Topics  . Smoking status: Former Smoker    Quit date: 07/04/2003  . Smokeless tobacco: Never Used  . Alcohol use No     Comment: Quit 2005   Allergies   Patient has no known allergies.  Review of Systems Review of Systems  Constitutional: Positive for fatigue. Negative for chills and fever.  HENT: Negative for congestion and facial swelling.   Eyes: Negative for discharge and visual disturbance.  Respiratory: Negative for shortness of breath.   Cardiovascular: Negative for chest pain and palpitations.  Gastrointestinal: Positive for nausea and vomiting. Negative for abdominal pain and diarrhea.  Musculoskeletal: Negative for arthralgias and myalgias.  Skin: Negative for color change and rash.  Neurological: Positive for weakness. Negative for tremors, syncope and headaches.  Psychiatric/Behavioral: Negative for confusion and dysphoric mood.  All other systems reviewed and are negative.  Physical Exam Updated Vital Signs BP (!) 135/92   Pulse 90   Temp 98.8 F (37.1 C) (Oral)   Resp 15   Ht 5\' 9"  (1.753 m)   Wt 80.7 kg (178 lb)   SpO2 98%   BMI 26.29 kg/m   Physical Exam  Constitutional: He is oriented to person, place, and time. He appears well-developed and well-nourished.  HENT:  Head: Normocephalic and atraumatic.  Eyes: EOM are normal. Pupils are equal, round, and reactive to light.  Neck: Normal range of motion. Neck supple. No JVD present.  Cardiovascular: Normal rate and regular rhythm.  Exam reveals no gallop and no friction rub.   No murmur  heard. Pulmonary/Chest: No respiratory distress. He has no wheezes.  Abdominal: He exhibits no distension. There is no rebound and no guarding.  Musculoskeletal: Normal range of motion.  Neurological: He is alert and oriented to person, place, and time.  Skin: No rash noted. No pallor.  Psychiatric: He has a normal mood and affect. His behavior is normal.  Nursing note and vitals reviewed.    ED Treatments / Results  DIAGNOSTIC STUDIES: Oxygen Saturation is 98% on RA, normal by my interpretation.   COORDINATION OF CARE: 11:22 PM-Discussed next steps with pt. Pt verbalized understanding and is agreeable with the plan.   Labs (all labs ordered are listed, but only abnormal results are displayed) Labs Reviewed  I-STAT CHEM 8, ED - Abnormal; Notable for the following:       Result Value   Sodium 130 (*)  Chloride 92 (*)    BUN 26 (*)    Glucose, Bld 115 (*)    Calcium, Ion 1.01 (*)    Hemoglobin 12.9 (*)    HCT 38.0 (*)    All other components within normal limits    EKG  EKG Interpretation None       Radiology No results found.  Procedures Procedures (including critical care time)  Medications Ordered in ED Medications  sodium chloride 0.9 % bolus 1,000 mL (0 mLs Intravenous Stopped 09/17/16 0051)  ondansetron (ZOFRAN-ODT) disintegrating tablet 4 mg (4 mg Oral Given 09/16/16 2344)    Initial Impression / Assessment and Plan / ED Course  I have reviewed the triage vital signs and the nursing notes.  Pertinent labs & imaging results that were available during my care of the patient were reviewed by me and considered in my medical decision making (see chart for details).     62 yo M With a chief complaint of weakness. This been ongoing for weeks. He is undergoing chemotherapy. He has discussed this with his oncologist to suggested daily fluids. He has so far refused. Felt too weak to make it through the weekend taken to the ED. Denied chest pain or shortness of  breath. Denied abdominal pain. Having persistent nausea that he attributes to his oral secretions. Given Zofran with some improvement of his symptoms. Fluids. Discharge home.  7:02 AM:  I have discussed the diagnosis/risks/treatment options with the patient and family and believe the pt to be eligible for discharge home to follow-up with Oncology. We also discussed returning to the ED immediately if new or worsening sx occur. We discussed the sx which are most concerning (e.g., sudden worsening pain, fever, inability to tolerate by mouth) that necessitate immediate return. Medications administered to the patient during their visit and any new prescriptions provided to the patient are listed below.  Medications given during this visit Medications  sodium chloride 0.9 % bolus 1,000 mL (0 mLs Intravenous Stopped 09/17/16 0051)  ondansetron (ZOFRAN-ODT) disintegrating tablet 4 mg (4 mg Oral Given 09/16/16 2344)     The patient appears reasonably screen and/or stabilized for discharge and I doubt any other medical condition or other [facility] requiring further screening, evaluation, or treatment in the ED at this time prior to discharge.    Final Clinical Impressions(s) / ED Diagnoses   Final diagnoses:  Dehydration  Weakness    New Prescriptions Discharge Medication List as of 09/17/2016  2:20 AM     I personally performed the services described in this documentation, which was scribed in my presence. The recorded information has been reviewed and is accurate.     Deno Etienne, DO 09/17/16 [phone]

## 2016-09-19 ENCOUNTER — Encounter: Payer: Self-pay | Admitting: Hematology and Oncology

## 2016-09-19 ENCOUNTER — Encounter: Payer: Self-pay | Admitting: Radiation Oncology

## 2016-09-19 ENCOUNTER — Ambulatory Visit
Admission: RE | Admit: 2016-09-19 | Discharge: 2016-09-19 | Disposition: A | Discharge status: Home / Self Care | Payer: BLUE CROSS/BLUE SHIELD | Source: Ambulatory Visit | Attending: Radiation Oncology | Admitting: Radiation Oncology

## 2016-09-19 ENCOUNTER — Other Ambulatory Visit: Payer: Self-pay | Admitting: Radiation Oncology

## 2016-09-19 VITALS — BP 130/108 | HR 89 | Temp 99.0°F | Resp 18 | Wt 183.2 lb

## 2016-09-19 DIAGNOSIS — C090 Malignant neoplasm of tonsillar fossa: Secondary | ICD-10-CM

## 2016-09-19 DIAGNOSIS — Z510 Encounter for antineoplastic radiation therapy: Secondary | ICD-10-CM | POA: Diagnosis not present

## 2016-09-19 NOTE — Progress Notes (Signed)
   Weekly Management Note:  outpatient    ICD-10-CM   1. Cancer of tonsillar fossa (HCC) C09.0     Current Dose: 54 Gy  Projected Dose: 70 Gy   Narrative:  The patient presents for routine under treatment assessment.  CBCT/MVCT images/Port film x-rays were reviewed.  The chart was checked.  Difficulty with taking in fluids, seen in ED and felt better w/ IVF...  Only taking in 16 oz water daily.  No nausea currently.   Physical Findings:  Wt Readings from Last 3 Encounters:  09/19/16 183 lb 3.2 oz (83.1 kg)  09/16/16 178 lb (80.7 kg)  09/15/16 181 lb (82.1 kg)    weight is 183 lb 3.2 oz (83.1 kg). His oral temperature is 99 F (37.2 C). His blood pressure is 130/108 (abnormal) and his pulse is 89. His respiration is 18.   Patchy mucositis with erythema in the throat. Mucous membranes moist. Skin over neck is dry/erythematous but intact.    CBC    Component Value Date/Time   WBC 4.9 09/14/2016 1424   WBC 8.7 08/05/2016 1159   RBC 4.52 09/14/2016 1424   RBC 4.88 08/05/2016 1159   HGB 12.9 (L) 09/16/2016 2321   HGB 13.8 09/14/2016 1424   HCT 38.0 (L) 09/16/2016 2321   HCT 39.5 09/14/2016 1424   PLT 290 09/14/2016 1424   MCV 87.4 09/14/2016 1424   MCH 30.5 09/14/2016 1424   MCH 30.1 08/05/2016 1159   MCHC 34.9 09/14/2016 1424   MCHC 33.7 08/05/2016 1159   RDW 13.3 09/14/2016 1424   LYMPHSABS 0.7 (L) 09/14/2016 1424   MONOABS 0.3 09/14/2016 1424   EOSABS 0.1 09/14/2016 1424   BASOSABS 0.0 09/14/2016 1424     CMP     Component Value Date/Time   NA 130 (L) 09/16/2016 2321   NA 137 09/14/2016 1424   K 3.6 09/16/2016 2321   K 3.6 09/14/2016 1424   CL 92 (L) 09/16/2016 2321   CO2 28 09/14/2016 1424   GLUCOSE 115 (H) 09/16/2016 2321   GLUCOSE 111 09/14/2016 1424   BUN 26 (H) 09/16/2016 2321   BUN 25.6 09/14/2016 1424   CREATININE 1.00 09/16/2016 2321   CREATININE 1.2 09/14/2016 1424   CALCIUM 9.7 09/14/2016 1424   PROT 7.3 09/14/2016 1424   ALBUMIN 3.7  09/14/2016 1424   AST 28 09/14/2016 1424   ALT 52 09/14/2016 1424   ALKPHOS 42 09/14/2016 1424   BILITOT 0.45 09/14/2016 1424   GFRNONAA >60 08/05/2016 1159   GFRAA >60 08/05/2016 1159    Impression:  The patient is tolerating radiotherapy   Plan:  Continue radiotherapy as planned.     IVF ordered through the week (Friday). Sees med/onc Thursday.  -----------------------------------  Eppie Gibson, MD

## 2016-09-19 NOTE — Progress Notes (Signed)
Weekly rad txs 27/35 left tonsil, dry desquamation on neck,erythema, using biafine cream once a day, was in the ER Friday for weakness  And fatiue, received IVF"S, takes in via peg osmolite-45 cans a day and occasional ensure as well, flushes poeg tube with water before and after, no c/o nausea at present time, eats pasta stated sometimes, , wants to talk about setting up IVF'S daily now 2:05 PM BP (!) 130/108   Pulse 89   Temp 99 F (37.2 C) (Oral)   Resp 18   Wt 183 lb 3.2 oz (83.1 kg)   BMI 27.05 kg/m   Wt Readings from Last 3 Encounters:  09/19/16 183 lb 3.2 oz (83.1 kg)  09/16/16 178 lb (80.7 kg)  09/15/16 181 lb (82.1 kg)

## 2016-09-19 NOTE — Progress Notes (Signed)
Received request for chart notes for Transderm-Scop PA. Sent recent office notes via fax to [facility]. Fax received ok per confirmation sheet. Generic not available per pharmacy anywhere.

## 2016-09-19 NOTE — Progress Notes (Signed)
Received PA approval for Transderm-Scop patch from [facility].  PA approved 09/15/16-09/15/17.  Called [facility]) to advise of approval. He states ok and thank you.

## 2016-09-20 ENCOUNTER — Ambulatory Visit
Admission: RE | Admit: 2016-09-20 | Discharge: 2016-09-20 | Disposition: A | Discharge status: Home / Self Care | Payer: BLUE CROSS/BLUE SHIELD | Source: Ambulatory Visit | Attending: Radiation Oncology | Admitting: Radiation Oncology

## 2016-09-20 ENCOUNTER — Ambulatory Visit (HOSPITAL_BASED_OUTPATIENT_CLINIC_OR_DEPARTMENT_OTHER): Payer: BLUE CROSS/BLUE SHIELD

## 2016-09-20 ENCOUNTER — Telehealth: Payer: Self-pay | Admitting: Nutrition

## 2016-09-20 DIAGNOSIS — R110 Nausea: Secondary | ICD-10-CM | POA: Diagnosis not present

## 2016-09-20 DIAGNOSIS — C090 Malignant neoplasm of tonsillar fossa: Secondary | ICD-10-CM

## 2016-09-20 DIAGNOSIS — Z510 Encounter for antineoplastic radiation therapy: Secondary | ICD-10-CM | POA: Diagnosis not present

## 2016-09-20 MED ORDER — SODIUM CHLORIDE 0.9 % IV SOLN
Freq: Once | INTRAVENOUS | Status: AC
  Administered 2016-09-20: 11:00:00 via INTRAVENOUS

## 2016-09-20 MED ORDER — SODIUM CHLORIDE 0.9 % IV SOLN: Freq: Once | INTRAVENOUS | Status: DC

## 2016-09-20 MED ORDER — ONDANSETRON HCL 4 MG/2ML IJ SOLN
INTRAMUSCULAR | Status: AC
  Filled 2016-09-20: qty 4

## 2016-09-20 MED ORDER — SODIUM CHLORIDE 0.9% FLUSH
10.0000 mL | INTRAVENOUS | Status: DC | PRN
Start: 2016-09-20 — End: 2016-09-20
  Administered 2016-09-20: 10 mL
  Filled 2016-09-20: qty 10

## 2016-09-20 MED ORDER — HEPARIN SOD (PORK) LOCK FLUSH 100 UNIT/ML IV SOLN
500.0000 [IU] | Freq: Once | INTRAVENOUS | Status: AC | PRN
  Administered 2016-09-20: 500 [IU]
  Filled 2016-09-20: qty 5

## 2016-09-20 MED ORDER — ONDANSETRON HCL 4 MG/2ML IJ SOLN
8.0000 mg | Freq: Once | INTRAMUSCULAR | Status: AC
  Administered 2016-09-20: 8 mg via INTRAVENOUS

## 2016-09-20 NOTE — Patient Instructions (Signed)
Dehydration, Adult Dehydration is a condition in which there is not enough fluid or water in the body. This happens when you lose more fluids than you take in. Important organs, such as the kidneys, brain, and heart, cannot function without a proper amount of fluids. Any loss of fluids from the body can lead to dehydration. Dehydration can range from mild to severe. This condition should be treated right away to prevent it from becoming severe. What are the causes? This condition may be caused by:  Vomiting.  Diarrhea.  Excessive sweating, such as from heat exposure or exercise.  Not drinking enough fluid, especially: ? When ill. ? While doing activity that requires a lot of energy.  Excessive urination.  Fever.  Infection.  Certain medicines, such as medicines that cause the body to lose excess fluid (diuretics).  Inability to access safe drinking water.  Reduced physical ability to get adequate water and food.  What increases the risk? This condition is more likely to develop in people:  Who have a poorly controlled long-term (chronic) illness, such as diabetes, heart disease, or kidney disease.  Who are age 65 or older.  Who are disabled.  Who live in a place with high altitude.  Who play endurance sports.  What are the signs or symptoms? Symptoms of mild dehydration may include:  Thirst.  Dry lips.  Slightly dry mouth.  Dry, warm skin.  Dizziness. Symptoms of moderate dehydration may include:  Very dry mouth.  Muscle cramps.  Dark urine. Urine may be the color of tea.  Decreased urine production.  Decreased tear production.  Heartbeat that is irregular or faster than normal (palpitations).  Headache.  Light-headedness, especially when you stand up from a sitting position.  Fainting (syncope). Symptoms of severe dehydration may include:  Changes in skin, such as: ? Cold and clammy skin. ? Blotchy (mottled) or pale skin. ? Skin that does  not quickly return to normal after being lightly pinched and released (poor skin turgor).  Changes in body fluids, such as: ? Extreme thirst. ? No tear production. ? Inability to sweat when body temperature is high, such as in hot weather. ? Very little urine production.  Changes in vital signs, such as: ? Weak pulse. ? Pulse that is more than 100 beats a minute when sitting still. ? Rapid breathing. ? Low blood pressure.  Other changes, such as: ? Sunken eyes. ? Cold hands and feet. ? Confusion. ? Lack of energy (lethargy). ? Difficulty waking up from sleep. ? Short-term weight loss. ? Unconsciousness. How is this diagnosed? This condition is diagnosed based on your symptoms and a physical exam. Blood and urine tests may be done to help confirm the diagnosis. How is this treated? Treatment for this condition depends on the severity. Mild or moderate dehydration can often be treated at home. Treatment should be started right away. Do not wait until dehydration becomes severe. Severe dehydration is an emergency and it needs to be treated in a hospital. Treatment for mild dehydration may include:  Drinking more fluids.  Replacing salts and minerals in your blood (electrolytes) that you may have lost. Treatment for moderate dehydration may include:  Drinking an oral rehydration solution (ORS). This is a drink that helps you replace fluids and electrolytes (rehydrate). It can be found at pharmacies and retail stores. Treatment for severe dehydration may include:  Receiving fluids through an IV tube.  Receiving an electrolyte solution through a feeding tube that is passed through your nose   and into your stomach (nasogastric tube, or NG tube).  Correcting any abnormalities in electrolytes.  Treating the underlying cause of dehydration. Follow these instructions at home:  If directed by your health care provider, drink an ORS: ? Make an ORS by following instructions on the  package. ? Start by drinking small amounts, about  cup (120 mL) every 5-10 minutes. ? Slowly increase how much you drink until you have taken the amount recommended by your health care provider.  Drink enough clear fluid to keep your urine clear or pale yellow. If you were told to drink an ORS, finish the ORS first, then start slowly drinking other clear fluids. Drink fluids such as: ? Water. Do not drink only water. Doing that can lead to having too little salt (sodium) in the body (hyponatremia). ? Ice chips. ? Fruit juice that you have added water to (diluted fruit juice). ? Low-calorie sports drinks.  Avoid: ? Alcohol. ? Drinks that contain a lot of sugar. These include high-calorie sports drinks, fruit juice that is not diluted, and soda. ? Caffeine. ? Foods that are greasy or contain a lot of fat or sugar.  Take over-the-counter and prescription medicines only as told by your health care provider.  Do not take sodium tablets. This can lead to having too much sodium in the body (hypernatremia).  Eat foods that contain a healthy balance of electrolytes, such as bananas, oranges, potatoes, tomatoes, and spinach.  Keep all follow-up visits as told by your health care provider. This is important. Contact a health care provider if:  You have abdominal pain that: ? Gets worse. ? Stays in one area (localizes).  You have a rash.  You have a stiff neck.  You are more irritable than usual.  You are sleepier or more difficult to wake up than usual.  You feel weak or dizzy.  You feel very thirsty.  You have urinated only a small amount of very dark urine over 6-8 hours. Get help right away if:  You have symptoms of severe dehydration.  You cannot drink fluids without vomiting.  Your symptoms get worse with treatment.  You have a fever.  You have a severe headache.  You have vomiting or diarrhea that: ? Gets worse. ? Does not go away.  You have blood or green matter  (bile) in your vomit.  You have blood in your stool. This may cause stool to look black and tarry.  You have not urinated in 6-8 hours.  You faint.  Your heart rate while sitting still is over 100 beats a minute.  You have trouble breathing. This information is not intended to replace advice given to you by your health care provider. Make sure you discuss any questions you have with your health care provider. Document Released: 03/21/2005 Document Revised: 10/16/2015 Document Reviewed: 05/15/2015 Elsevier Interactive Patient Education  2018 [employer]  

## 2016-09-20 NOTE — Telephone Encounter (Signed)
Patient called me this morning. States he discussed daily IVF with MD yesterday and he thought he would be scheduled for them today. So far, this is not on his schedule. Patient reports he is having increased nausea and difficulty keeping fluids down. Contacted radiation oncology and left message with Enid Derry to inform Anderson Malta of patient's request.

## 2016-09-21 ENCOUNTER — Other Ambulatory Visit: Payer: Self-pay

## 2016-09-21 ENCOUNTER — Ambulatory Visit
Admission: RE | Admit: 2016-09-21 | Discharge: 2016-09-21 | Disposition: A | Discharge status: Home / Self Care | Payer: BLUE CROSS/BLUE SHIELD | Source: Ambulatory Visit | Attending: Radiation Oncology | Admitting: Radiation Oncology

## 2016-09-21 ENCOUNTER — Ambulatory Visit (HOSPITAL_BASED_OUTPATIENT_CLINIC_OR_DEPARTMENT_OTHER): Payer: BLUE CROSS/BLUE SHIELD

## 2016-09-21 VITALS — BP 134/86 | HR 80 | Temp 99.1°F | Resp 20

## 2016-09-21 DIAGNOSIS — Z510 Encounter for antineoplastic radiation therapy: Secondary | ICD-10-CM | POA: Diagnosis not present

## 2016-09-21 DIAGNOSIS — R110 Nausea: Secondary | ICD-10-CM

## 2016-09-21 DIAGNOSIS — C090 Malignant neoplasm of tonsillar fossa: Secondary | ICD-10-CM | POA: Diagnosis not present

## 2016-09-21 MED ORDER — HEPARIN SOD (PORK) LOCK FLUSH 100 UNIT/ML IV SOLN
500.0000 [IU] | Freq: Once | INTRAVENOUS | Status: DC | PRN
  Filled 2016-09-21: qty 5

## 2016-09-21 MED ORDER — SODIUM CHLORIDE 0.9% FLUSH
10.0000 mL | INTRAVENOUS | Status: DC | PRN
  Filled 2016-09-21: qty 10

## 2016-09-21 MED ORDER — ONDANSETRON HCL 4 MG/2ML IJ SOLN: 8.0000 mg | Freq: Once | INTRAMUSCULAR | Status: DC

## 2016-09-21 MED ORDER — SODIUM CHLORIDE 0.9 % IV SOLN
Freq: Once | INTRAVENOUS | Status: AC
  Administered 2016-09-21: 09:00:00 via INTRAVENOUS

## 2016-09-21 MED ORDER — SODIUM CHLORIDE 0.9 % IV SOLN: Freq: Once | INTRAVENOUS | Status: DC

## 2016-09-21 NOTE — Patient Instructions (Signed)
Dehydration, Adult Dehydration is when there is not enough fluid or water in your body. This happens when you lose more fluids than you take in. Dehydration can range from mild to very bad. It should be treated right away to keep it from getting very bad. Symptoms of mild dehydration may include:  Thirst.  Dry lips.  Slightly dry mouth.  Dry, warm skin.  Dizziness. Symptoms of moderate dehydration may include:  Very dry mouth.  Muscle cramps.  Dark pee (urine). Pee may be the color of tea.  Your body making less pee.  Your eyes making fewer tears.  Heartbeat that is uneven or faster than normal (palpitations).  Headache.  Light-headedness, especially when you stand up from sitting.  Fainting (syncope). Symptoms of very bad dehydration may include:  Changes in skin, such as: ? Cold and clammy skin. ? Blotchy (mottled) or pale skin. ? Skin that does not quickly return to normal after being lightly pinched and let go (poor skin turgor).  Changes in body fluids, such as: ? Feeling very thirsty. ? Your eyes making fewer tears. ? Not sweating when body temperature is high, such as in hot weather. ? Your body making very little pee.  Changes in vital signs, such as: ? Weak pulse. ? Pulse that is more than 100 beats a minute when you are sitting still. ? Fast breathing. ? Low blood pressure.  Other changes, such as: ? Sunken eyes. ? Cold hands and feet. ? Confusion. ? Lack of energy (lethargy). ? Trouble waking up from sleep. ? Short-term weight loss. ? Unconsciousness. Follow these instructions at home:  If told by your doctor, drink an ORS: ? Make an ORS by using instructions on the package. ? Start by drinking small amounts, about  cup (120 mL) every 5-10 minutes. ? Slowly drink more until you have had the amount that your doctor said to have.  Drink enough clear fluid to keep your pee clear or pale yellow. If you were told to drink an ORS, finish the ORS  first, then start slowly drinking clear fluids. Drink fluids such as: ? Water. Do not drink only water by itself. Doing that can make the salt (sodium) level in your body get too low (hyponatremia). ? Ice chips. ? Fruit juice that you have added water to (diluted). ? Low-calorie sports drinks.  Avoid: ? Alcohol. ? Drinks that have a lot of sugar. These include high-calorie sports drinks, fruit juice that does not have water added, and soda. ? Caffeine. ? Foods that are greasy or have a lot of fat or sugar.  Take over-the-counter and prescription medicines only as told by your doctor.  Do not take salt tablets. Doing that can make the salt level in your body get too high (hypernatremia).  Eat foods that have minerals (electrolytes). Examples include bananas, oranges, potatoes, tomatoes, and spinach.  Keep all follow-up visits as told by your doctor. This is important. Contact a doctor if:  You have belly (abdominal) pain that: ? Gets worse. ? Stays in one area (localizes).  You have a rash.  You have a stiff neck.  You get angry or annoyed more easily than normal (irritability).  You are more sleepy than normal.  You have a harder time waking up than normal.  You feel: ? Weak. ? Dizzy. ? Very thirsty.  You have peed (urinated) only a small amount of very dark pee during 6-8 hours. Get help right away if:  You have symptoms of   very bad dehydration.  You cannot drink fluids without throwing up (vomiting).  Your symptoms get worse with treatment.  You have a fever.  You have a very bad headache.  You are throwing up or having watery poop (diarrhea) and it: ? Gets worse. ? Does not go away.  You have blood or something green (bile) in your throw-up.  You have blood in your poop (stool). This may cause poop to look black and tarry.  You have not peed in 6-8 hours.  You pass out (faint).  Your heart rate when you are sitting still is more than 100 beats a  minute.  You have trouble breathing. This information is not intended to replace advice given to you by your health care provider. Make sure you discuss any questions you have with your health care provider. Document Released: 01/15/2009 Document Revised: 10/09/2015 Document Reviewed: 05/15/2015 Elsevier Interactive Patient Education  2018 [employer]  

## 2016-09-22 ENCOUNTER — Ambulatory Visit (HOSPITAL_BASED_OUTPATIENT_CLINIC_OR_DEPARTMENT_OTHER): Payer: BLUE CROSS/BLUE SHIELD | Admitting: Hematology and Oncology

## 2016-09-22 ENCOUNTER — Ambulatory Visit
Admission: RE | Admit: 2016-09-22 | Discharge: 2016-09-22 | Disposition: A | Discharge status: Home / Self Care | Payer: BLUE CROSS/BLUE SHIELD | Source: Ambulatory Visit | Attending: Radiation Oncology | Admitting: Radiation Oncology

## 2016-09-22 ENCOUNTER — Encounter: Payer: Self-pay | Admitting: *Deleted

## 2016-09-22 ENCOUNTER — Ambulatory Visit (HOSPITAL_BASED_OUTPATIENT_CLINIC_OR_DEPARTMENT_OTHER): Payer: BLUE CROSS/BLUE SHIELD

## 2016-09-22 ENCOUNTER — Encounter: Payer: Self-pay | Admitting: Hematology and Oncology

## 2016-09-22 ENCOUNTER — Ambulatory Visit: Payer: BLUE CROSS/BLUE SHIELD | Admitting: Nutrition

## 2016-09-22 VITALS — BP 132/93 | HR 79 | Temp 99.1°F | Resp 16

## 2016-09-22 DIAGNOSIS — R110 Nausea: Secondary | ICD-10-CM

## 2016-09-22 DIAGNOSIS — K1231 Oral mucositis (ulcerative) due to antineoplastic therapy: Secondary | ICD-10-CM

## 2016-09-22 DIAGNOSIS — Z510 Encounter for antineoplastic radiation therapy: Secondary | ICD-10-CM | POA: Diagnosis not present

## 2016-09-22 DIAGNOSIS — C090 Malignant neoplasm of tonsillar fossa: Secondary | ICD-10-CM | POA: Diagnosis not present

## 2016-09-22 DIAGNOSIS — R432 Parageusia: Secondary | ICD-10-CM

## 2016-09-22 MED ORDER — HEPARIN SOD (PORK) LOCK FLUSH 100 UNIT/ML IV SOLN
500.0000 [IU] | Freq: Once | INTRAVENOUS | Status: AC | PRN
  Administered 2016-09-22: 500 [IU]
  Filled 2016-09-22: qty 5

## 2016-09-22 MED ORDER — SODIUM CHLORIDE 0.9% FLUSH
10.0000 mL | INTRAVENOUS | Status: DC | PRN
Start: 2016-09-22 — End: 2016-09-22
  Administered 2016-09-22: 10 mL
  Filled 2016-09-22: qty 10

## 2016-09-22 MED ORDER — SODIUM CHLORIDE 0.9 % IV SOLN
Freq: Once | INTRAVENOUS | Status: AC
  Administered 2016-09-22: 15:00:00 via INTRAVENOUS

## 2016-09-22 NOTE — Progress Notes (Signed)
[facility] OFFICE PROGRESS NOTE  Patient Care Team: Patient, No Pcp Per as PCP - General (General Practice) Rozetta Nunnery, MD as Consulting Physician (Otolaryngology) Eppie Gibson, MD as Attending Physician (Radiation Oncology) Heath Lark, MD as Consulting Physician (Hematology and Oncology) Lenn Cal, DDS as Consulting Physician (Dentistry) Leota Sauers, RN as Oncology Nurse Navigator Karie Mainland, RD as Dietitian (Nutrition) Valentino Saxon [place], CCC-SLP as Speech Language Pathologist (Speech Pathology) Jomarie Longs, PT as Physical Therapist (Physical Therapy) Kennith Center, LCSW as Social Worker  SUMMARY OF ONCOLOGIC HISTORY:   Cancer of tonsillar fossa ([place])   06/27/2016 Imaging    Ct neck: Multiple malignant appearing lymph nodes in the left cervical chain as described, up to 23 mm in diameter. Possible early nodal disease in the right level 2 neck. Asymmetric fullness of the left tonsillar fossa, suspect squamous cell carcinoma primary.       06/28/2016 Pathology Results    Diagnosis Tonsil, biopsy, left - INVASIVE SQUAMOUS CELL CARCINOMA. - SEE COMMENT. Microscopic Comment The carcinoma appears moderately differentiated and is non-keratinizing. A p16 stain will be performed and the results reported separately The malignant cells are strongly and diffusely positive for p16.      07/05/2016 PET scan    1. Hypermetabolism in the region of the left palatini tonsil, consistent with known primary malignancy. 2. Hypermetabolic left-sided level II and III metastatic cervical lymphadenopathy. 3. 6 mm posterior left lower lobe pulmonary nodule. Attention on follow-up recommended as metastatic disease not excluded. 4. Cholelithiasis. 5. Nephrolithiasis. 6. Coronary artery and thoracoabdominal aortic atherosclerosis.      08/02/2016 Miscellaneous    Baseline hearing test is near normal      08/05/2016 Procedure    Fluoroscopic insertion  of a 20-French "pull-through" gastrostomy      08/05/2016 Procedure    Ultrasound and fluoroscopically guided right internal jugular single lumen power port catheter insertion. Tip in the SVC/RA junction. Catheter ready for use.      08/12/2016 - 09/09/2016 Chemotherapy    He only received 2 doses of cisplatin due to unbearable side-effects      09/02/2016 Adverse Reaction    Second dose was delayed by patient no feeling well       INTERVAL HISTORY: Please see below for problem oriented charting. He returns for further follow-up Over the weekend, he went to the emergency department and received IV fluids for dehydration Since then, he was prescribed IV fluids daily He denies excessive pain He has started to use scopolamine patch He had less secretion and nausea appears to be better controlled He denies constipation  REVIEW OF SYSTEMS:   Constitutional: Denies fevers, chills or abnormal weight loss Eyes: Denies blurriness of vision Respiratory: Denies cough, dyspnea or wheezes Cardiovascular: Denies palpitation, chest discomfort or lower extremity swelling Skin: Denies abnormal skin rashes Lymphatics: Denies new lymphadenopathy or easy bruising Neurological:Denies numbness, tingling or new weaknesses Behavioral/Psych: Mood is stable, no new changes  All other systems were reviewed with the patient and are negative.  I have reviewed the past medical history, past surgical history, social history and family history with the patient and they are unchanged from previous note.  ALLERGIES:  has No Known Allergies.  MEDICATIONS:  Current Outpatient Prescriptions  Medication Sig Dispense Refill  . acetaminophen (TYLENOL) 325 MG tablet Take 650 mg by mouth every 6 (six) hours as needed for mild pain or headache.     . Melatonin 10 MG TABS  Take 10 mg by mouth at bedtime as needed (sleep).     . Morphine Sulfate (MORPHINE CONCENTRATE) 10 mg / 0.5 ml concentrated solution Take 0.5 mLs (10  mg total) by mouth every 2 (two) hours as needed for severe pain. 240 mL 0  . Multiple Vitamin (MULTIVITAMIN) tablet Take 1 tablet by mouth daily.    . Omega-3 Fatty Acids (FISH OIL OMEGA-3 PO) Take by mouth.    Marland Kitchen scopolamine (TRANSDERM-SCOP) 1 MG/3DAYS Place 1 patch (1.5 mg total) onto the skin every 3 (three) days. 10 patch 12  . triamcinolone (NASACORT) 55 MCG/ACT AERO nasal inhaler Place 2 sprays into the nose daily. 1 Inhaler 12  . VALERIAN ROOT PO Take 1 tablet by mouth daily.     . fentaNYL (DURAGESIC - DOSED MCG/HR) 25 MCG/HR patch Place 1 patch (25 mcg total) onto the skin every 3 (three) days. (Patient not taking: Reported on 09/12/2016) 5 patch 0  . lidocaine (XYLOCAINE) 2 % solution Patient: Mix 1part 2% viscous lidocaine, 1part H20. Swish & swallow 4mL of diluted mixture, 27min before meals and at bedtime, up to QID (Patient not taking: Reported on 09/17/2016) 100 mL 5   No current facility-administered medications for this visit.    Facility-Administered Medications Ordered in Other Visits  Medication Dose Route Frequency Provider Last Rate Last Dose  . sodium chloride flush (NS) 0.9 % injection 10 mL  10 mL Intracatheter PRN Heath Lark, MD   10 mL at 09/22/16 1618    PHYSICAL EXAMINATION: ECOG PERFORMANCE STATUS: 1 - Symptomatic but completely ambulatory  Vitals:   09/22/16 1422  BP: 131/80  Pulse: 88  Resp: 18  Temp: 98.9 F (37.2 C)   Filed Weights   09/22/16 1422  Weight: 182 lb 4.8 oz (82.7 kg)    GENERAL:alert, no distress and comfortable SKIN the skin rash around his neck EYES: normal, Conjunctiva are pink and non-injected, sclera clear OROPHARYNX: Noted mucositis.  No thrush NECK: supple, thyroid normal size, non-tender, without nodularity LYMPH:  no palpable lymphadenopathy in the cervical, axillary or inguinal LUNGS: clear to auscultation and percussion with normal breathing effort HEART: regular rate & rhythm and no murmurs and no lower extremity  edema ABDOMEN:abdomen soft, non-tender and normal bowel sounds Musculoskeletal:no cyanosis of digits and no clubbing  NEURO: alert & oriented x 3 with fluent speech, no focal motor/sensory deficits  LABORATORY DATA:  I have reviewed the data as listed    Component Value Date/Time   NA 130 (L) 09/16/2016 2321   NA 137 09/14/2016 1424   K 3.6 09/16/2016 2321   K 3.6 09/14/2016 1424   CL 92 (L) 09/16/2016 2321   CO2 28 09/14/2016 1424   GLUCOSE 115 (H) 09/16/2016 2321   GLUCOSE 111 09/14/2016 1424   BUN 26 (H) 09/16/2016 2321   BUN 25.6 09/14/2016 1424   CREATININE 1.00 09/16/2016 2321   CREATININE 1.2 09/14/2016 1424   CALCIUM 9.7 09/14/2016 1424   PROT 7.3 09/14/2016 1424   ALBUMIN 3.7 09/14/2016 1424   AST 28 09/14/2016 1424   ALT 52 09/14/2016 1424   ALKPHOS 42 09/14/2016 1424   BILITOT 0.45 09/14/2016 1424   GFRNONAA >60 08/05/2016 1159   GFRAA >60 08/05/2016 1159    No results found for: SPEP, UPEP  Lab Results  Component Value Date   WBC 4.9 09/14/2016   NEUTROABS 3.8 09/14/2016   HGB 12.9 (L) 09/16/2016   HCT 38.0 (L) 09/16/2016   MCV 87.4 09/14/2016  PLT 290 09/14/2016      Chemistry      Component Value Date/Time   NA 130 (L) 09/16/2016 2321   NA 137 09/14/2016 1424   K 3.6 09/16/2016 2321   K 3.6 09/14/2016 1424   CL 92 (L) 09/16/2016 2321   CO2 28 09/14/2016 1424   BUN 26 (H) 09/16/2016 2321   BUN 25.6 09/14/2016 1424   CREATININE 1.00 09/16/2016 2321   CREATININE 1.2 09/14/2016 1424      Component Value Date/Time   CALCIUM 9.7 09/14/2016 1424   ALKPHOS 42 09/14/2016 1424   AST 28 09/14/2016 1424   ALT 52 09/14/2016 1424   BILITOT 0.45 09/14/2016 1424      ASSESSMENT & PLAN:  Cancer of tonsillar fossa ([place]) The patient is clearly aggravated by unpleasant side effects from treatment Unfortunately, there is no antidote to reverse his symptoms He has made informed decision not to pursue further chemotherapy I advocate for aggressive  supportive care I will continue to see him on a weekly basis  Nausea without vomiting I recommend he takes antiemetics and scopolamine patch He will continue IV fluid support along with IV antiemetics  Mucositis due to antineoplastic therapy Recommend he takes prescription pain medicine as prescribed and I will reassess pain control next week  Dysgeusia This is due to recent treatment. He has started to use his feeding tube for nutritional supplement He will continue close follow-up with dietitian He has not lost weight since the last time he was seen   No orders of the defined types were placed in this encounter.  All questions were answered. The patient knows to call the clinic with any problems, questions or concerns. No barriers to learning was detected. I spent 15 minutes counseling the patient face to face. The total time spent in the appointment was 20 minutes and more than 50% was on counseling and review of test results     Heath Lark, MD 09/22/2016 4:28 PM

## 2016-09-22 NOTE — Assessment & Plan Note (Signed)
This is due to recent treatment. He has started to use his feeding tube for nutritional supplement He will continue close follow-up with dietitian He has not lost weight since the last time he was seen

## 2016-09-22 NOTE — Assessment & Plan Note (Signed)
I recommend he takes antiemetics and scopolamine patch He will continue IV fluid support along with IV antiemetics

## 2016-09-22 NOTE — Assessment & Plan Note (Signed)
The patient is clearly aggravated by unpleasant side effects from treatment Unfortunately, there is no antidote to reverse his symptoms He has made informed decision not to pursue further chemotherapy I advocate for aggressive supportive care I will continue to see him on a weekly basis

## 2016-09-22 NOTE — Patient Instructions (Signed)
Dehydration, Adult Dehydration is when there is not enough fluid or water in your body. This happens when you lose more fluids than you take in. Dehydration can range from mild to very bad. It should be treated right away to keep it from getting very bad. Symptoms of mild dehydration may include:  Thirst.  Dry lips.  Slightly dry mouth.  Dry, warm skin.  Dizziness. Symptoms of moderate dehydration may include:  Very dry mouth.  Muscle cramps.  Dark pee (urine). Pee may be the color of tea.  Your body making less pee.  Your eyes making fewer tears.  Heartbeat that is uneven or faster than normal (palpitations).  Headache.  Light-headedness, especially when you stand up from sitting.  Fainting (syncope). Symptoms of very bad dehydration may include:  Changes in skin, such as: ? Cold and clammy skin. ? Blotchy (mottled) or pale skin. ? Skin that does not quickly return to normal after being lightly pinched and let go (poor skin turgor).  Changes in body fluids, such as: ? Feeling very thirsty. ? Your eyes making fewer tears. ? Not sweating when body temperature is high, such as in hot weather. ? Your body making very little pee.  Changes in vital signs, such as: ? Weak pulse. ? Pulse that is more than 100 beats a minute when you are sitting still. ? Fast breathing. ? Low blood pressure.  Other changes, such as: ? Sunken eyes. ? Cold hands and feet. ? Confusion. ? Lack of energy (lethargy). ? Trouble waking up from sleep. ? Short-term weight loss. ? Unconsciousness. Follow these instructions at home:  If told by your doctor, drink an ORS: ? Make an ORS by using instructions on the package. ? Start by drinking small amounts, about  cup (120 mL) every 5-10 minutes. ? Slowly drink more until you have had the amount that your doctor said to have.  Drink enough clear fluid to keep your pee clear or pale yellow. If you were told to drink an ORS, finish the ORS  first, then start slowly drinking clear fluids. Drink fluids such as: ? Water. Do not drink only water by itself. Doing that can make the salt (sodium) level in your body get too low (hyponatremia). ? Ice chips. ? Fruit juice that you have added water to (diluted). ? Low-calorie sports drinks.  Avoid: ? Alcohol. ? Drinks that have a lot of sugar. These include high-calorie sports drinks, fruit juice that does not have water added, and soda. ? Caffeine. ? Foods that are greasy or have a lot of fat or sugar.  Take over-the-counter and prescription medicines only as told by your doctor.  Do not take salt tablets. Doing that can make the salt level in your body get too high (hypernatremia).  Eat foods that have minerals (electrolytes). Examples include bananas, oranges, potatoes, tomatoes, and spinach.  Keep all follow-up visits as told by your doctor. This is important. Contact a doctor if:  You have belly (abdominal) pain that: ? Gets worse. ? Stays in one area (localizes).  You have a rash.  You have a stiff neck.  You get angry or annoyed more easily than normal (irritability).  You are more sleepy than normal.  You have a harder time waking up than normal.  You feel: ? Weak. ? Dizzy. ? Very thirsty.  You have peed (urinated) only a small amount of very dark pee during 6-8 hours. Get help right away if:  You have symptoms of   very bad dehydration.  You cannot drink fluids without throwing up (vomiting).  Your symptoms get worse with treatment.  You have a fever.  You have a very bad headache.  You are throwing up or having watery poop (diarrhea) and it: ? Gets worse. ? Does not go away.  You have blood or something green (bile) in your throw-up.  You have blood in your poop (stool). This may cause poop to look black and tarry.  You have not peed in 6-8 hours.  You pass out (faint).  Your heart rate when you are sitting still is more than 100 beats a  minute.  You have trouble breathing. This information is not intended to replace advice given to you by your health care provider. Make sure you discuss any questions you have with your health care provider. Document Released: 01/15/2009 Document Revised: 10/09/2015 Document Reviewed: 05/15/2015 Elsevier Interactive Patient Education  2018 [employer]  

## 2016-09-22 NOTE — Assessment & Plan Note (Signed)
Recommend he takes prescription pain medicine as prescribed and I will reassess pain control next week

## 2016-09-22 NOTE — Progress Notes (Signed)
Nutrition follow-up completed with patient receiving radiation and chemotherapy for tonsil cancer. Weight is stable and documented as 182.3 pounds June 21 increased from 181 pounds June 14. Patient reports nausea has improved. He is now receiving IV fluids. Patient reports he is trying to eat and drink what he can but is vague about what he does tolerate by mouth. Patient understands Osmolite 1.5 , goal rate with no oral intake is 7 bottles daily.  Nutrition diagnosis: Inadequate oral intake continues.  Intervention: Patient educated to continue strategies for adequate calories and protein intake via mouth and via tube. Encouraged increased hydration by mouth and through tube. Stressed the importance of minimizing weight loss. Teach back method used.  Monitoring, evaluation, goals:  Patient will continue to work to increase calories and protein as well as fluids to minimize weight loss and dehydration.  Next visit: Wednesday, June 27.  **Disclaimer: This note was dictated with voice recognition software. Similar sounding words can inadvertently be transcribed and this note may contain transcription errors which may not have been corrected upon publication of note.**

## 2016-09-22 NOTE — Progress Notes (Signed)
Oncology Nurse Navigator Documentation  Met with Mr. Douglas Norris during Established Patient appt with Dr. Alvy Bimler.  He was unaccompanied. He reported:  Minimal throat pain.  Takes morphine sulfate at bedtime to aid sleep.  Reduced energy but maintaining activity.  Instilling 6 cans nutritional supplement daily via PEG.  Feels benefit of IVF. He reiterated refusal of additional chemo.  He explained he wants to avoid SEs which have progressively lasted longer with each subsequent infusion.  He agreed to Dr. Calton Dach recommendation of daily IVF for next 3 weeks. He recognized last RT is next Thursday. He knows to call me with needs/concerns.  Gayleen Orem, RN, BSN, [facility] On[facility] Nurse [place] [place] [phone]

## 2016-09-23 ENCOUNTER — Ambulatory Visit: Payer: Self-pay

## 2016-09-23 ENCOUNTER — Ambulatory Visit
Admission: RE | Admit: 2016-09-23 | Discharge: 2016-09-23 | Disposition: A | Discharge status: Home / Self Care | Payer: BLUE CROSS/BLUE SHIELD | Source: Ambulatory Visit | Attending: Radiation Oncology | Admitting: Radiation Oncology

## 2016-09-23 ENCOUNTER — Ambulatory Visit (HOSPITAL_BASED_OUTPATIENT_CLINIC_OR_DEPARTMENT_OTHER): Payer: BLUE CROSS/BLUE SHIELD

## 2016-09-23 VITALS — BP 129/88 | HR 95 | Temp 98.9°F | Resp 20 | Ht 68.0 in | Wt 183.7 lb

## 2016-09-23 DIAGNOSIS — C090 Malignant neoplasm of tonsillar fossa: Secondary | ICD-10-CM | POA: Diagnosis not present

## 2016-09-23 DIAGNOSIS — R110 Nausea: Secondary | ICD-10-CM | POA: Diagnosis not present

## 2016-09-23 DIAGNOSIS — Z510 Encounter for antineoplastic radiation therapy: Secondary | ICD-10-CM | POA: Diagnosis not present

## 2016-09-23 MED ORDER — SODIUM CHLORIDE 0.9 % IV SOLN
Freq: Once | INTRAVENOUS | Status: AC
  Administered 2016-09-23: 13:00:00 via INTRAVENOUS

## 2016-09-23 MED ORDER — HEPARIN SOD (PORK) LOCK FLUSH 100 UNIT/ML IV SOLN
500.0000 [IU] | Freq: Once | INTRAVENOUS | Status: AC | PRN
  Administered 2016-09-23: 500 [IU]
  Filled 2016-09-23: qty 5

## 2016-09-23 MED ORDER — SODIUM CHLORIDE 0.9 % IV SOLN: Freq: Once | INTRAVENOUS | Status: DC

## 2016-09-23 MED ORDER — ONDANSETRON HCL 4 MG/2ML IJ SOLN
8.0000 mg | Freq: Once | INTRAMUSCULAR | Status: AC
  Administered 2016-09-23: 8 mg via INTRAVENOUS

## 2016-09-23 MED ORDER — SODIUM CHLORIDE 0.9% FLUSH
10.0000 mL | INTRAVENOUS | Status: DC | PRN
  Administered 2016-09-23: 10 mL
  Filled 2016-09-23: qty 10

## 2016-09-23 MED ORDER — ONDANSETRON HCL 4 MG/2ML IJ SOLN
INTRAMUSCULAR | Status: AC
  Filled 2016-09-23: qty 4

## 2016-09-23 NOTE — Patient Instructions (Signed)
Dehydration, Adult Dehydration is a condition in which there is not enough fluid or water in the body. This happens when you lose more fluids than you take in. Important organs, such as the kidneys, brain, and heart, cannot function without a proper amount of fluids. Any loss of fluids from the body can lead to dehydration. Dehydration can range from mild to severe. This condition should be treated right away to prevent it from becoming severe. What are the causes? This condition may be caused by:  Vomiting.  Diarrhea.  Excessive sweating, such as from heat exposure or exercise.  Not drinking enough fluid, especially: ? When ill. ? While doing activity that requires a lot of energy.  Excessive urination.  Fever.  Infection.  Certain medicines, such as medicines that cause the body to lose excess fluid (diuretics).  Inability to access safe drinking water.  Reduced physical ability to get adequate water and food.  What increases the risk? This condition is more likely to develop in people:  Who have a poorly controlled long-term (chronic) illness, such as diabetes, heart disease, or kidney disease.  Who are age 65 or older.  Who are disabled.  Who live in a place with high altitude.  Who play endurance sports.  What are the signs or symptoms? Symptoms of mild dehydration may include:  Thirst.  Dry lips.  Slightly dry mouth.  Dry, warm skin.  Dizziness. Symptoms of moderate dehydration may include:  Very dry mouth.  Muscle cramps.  Dark urine. Urine may be the color of tea.  Decreased urine production.  Decreased tear production.  Heartbeat that is irregular or faster than normal (palpitations).  Headache.  Light-headedness, especially when you stand up from a sitting position.  Fainting (syncope). Symptoms of severe dehydration may include:  Changes in skin, such as: ? Cold and clammy skin. ? Blotchy (mottled) or pale skin. ? Skin that does  not quickly return to normal after being lightly pinched and released (poor skin turgor).  Changes in body fluids, such as: ? Extreme thirst. ? No tear production. ? Inability to sweat when body temperature is high, such as in hot weather. ? Very little urine production.  Changes in vital signs, such as: ? Weak pulse. ? Pulse that is more than 100 beats a minute when sitting still. ? Rapid breathing. ? Low blood pressure.  Other changes, such as: ? Sunken eyes. ? Cold hands and feet. ? Confusion. ? Lack of energy (lethargy). ? Difficulty waking up from sleep. ? Short-term weight loss. ? Unconsciousness. How is this diagnosed? This condition is diagnosed based on your symptoms and a physical exam. Blood and urine tests may be done to help confirm the diagnosis. How is this treated? Treatment for this condition depends on the severity. Mild or moderate dehydration can often be treated at home. Treatment should be started right away. Do not wait until dehydration becomes severe. Severe dehydration is an emergency and it needs to be treated in a hospital. Treatment for mild dehydration may include:  Drinking more fluids.  Replacing salts and minerals in your blood (electrolytes) that you may have lost. Treatment for moderate dehydration may include:  Drinking an oral rehydration solution (ORS). This is a drink that helps you replace fluids and electrolytes (rehydrate). It can be found at pharmacies and retail stores. Treatment for severe dehydration may include:  Receiving fluids through an IV tube.  Receiving an electrolyte solution through a feeding tube that is passed through your nose   and into your stomach (nasogastric tube, or NG tube).  Correcting any abnormalities in electrolytes.  Treating the underlying cause of dehydration. Follow these instructions at home:  If directed by your health care provider, drink an ORS: ? Make an ORS by following instructions on the  package. ? Start by drinking small amounts, about  cup (120 mL) every 5-10 minutes. ? Slowly increase how much you drink until you have taken the amount recommended by your health care provider.  Drink enough clear fluid to keep your urine clear or pale yellow. If you were told to drink an ORS, finish the ORS first, then start slowly drinking other clear fluids. Drink fluids such as: ? Water. Do not drink only water. Doing that can lead to having too little salt (sodium) in the body (hyponatremia). ? Ice chips. ? Fruit juice that you have added water to (diluted fruit juice). ? Low-calorie sports drinks.  Avoid: ? Alcohol. ? Drinks that contain a lot of sugar. These include high-calorie sports drinks, fruit juice that is not diluted, and soda. ? Caffeine. ? Foods that are greasy or contain a lot of fat or sugar.  Take over-the-counter and prescription medicines only as told by your health care provider.  Do not take sodium tablets. This can lead to having too much sodium in the body (hypernatremia).  Eat foods that contain a healthy balance of electrolytes, such as bananas, oranges, potatoes, tomatoes, and spinach.  Keep all follow-up visits as told by your health care provider. This is important. Contact a health care provider if:  You have abdominal pain that: ? Gets worse. ? Stays in one area (localizes).  You have a rash.  You have a stiff neck.  You are more irritable than usual.  You are sleepier or more difficult to wake up than usual.  You feel weak or dizzy.  You feel very thirsty.  You have urinated only a small amount of very dark urine over 6-8 hours. Get help right away if:  You have symptoms of severe dehydration.  You cannot drink fluids without vomiting.  Your symptoms get worse with treatment.  You have a fever.  You have a severe headache.  You have vomiting or diarrhea that: ? Gets worse. ? Does not go away.  You have blood or green matter  (bile) in your vomit.  You have blood in your stool. This may cause stool to look black and tarry.  You have not urinated in 6-8 hours.  You faint.  Your heart rate while sitting still is over 100 beats a minute.  You have trouble breathing. This information is not intended to replace advice given to you by your health care provider. Make sure you discuss any questions you have with your health care provider. Document Released: 03/21/2005 Document Revised: 10/16/2015 Document Reviewed: 05/15/2015 Elsevier Interactive Patient Education  2018 [employer]  

## 2016-09-23 NOTE — Progress Notes (Signed)
Pt to continue to get IV fluid support over the next 2-3 weeks. Awaiting scheduling for Monday and the rest of next week. Pt went to RadOnc for his treatment after his IV fluids, then will check with scheduling after that.

## 2016-09-24 ENCOUNTER — Ambulatory Visit (HOSPITAL_BASED_OUTPATIENT_CLINIC_OR_DEPARTMENT_OTHER): Payer: BLUE CROSS/BLUE SHIELD

## 2016-09-24 VITALS — BP 140/94 | HR 95 | Temp 99.0°F | Resp 18

## 2016-09-24 DIAGNOSIS — C090 Malignant neoplasm of tonsillar fossa: Secondary | ICD-10-CM | POA: Diagnosis not present

## 2016-09-24 MED ORDER — HEPARIN SOD (PORK) LOCK FLUSH 100 UNIT/ML IV SOLN
250.0000 [IU] | Freq: Once | INTRAVENOUS | Status: AC | PRN
  Administered 2016-09-24: 250 [IU]
  Filled 2016-09-24: qty 5

## 2016-09-24 MED ORDER — SODIUM CHLORIDE 0.9 % IV SOLN
Freq: Once | INTRAVENOUS | Status: AC
  Administered 2016-09-24: 08:00:00 via INTRAVENOUS

## 2016-09-24 MED ORDER — ONDANSETRON HCL 4 MG/2ML IJ SOLN
INTRAMUSCULAR | Status: AC
  Filled 2016-09-24: qty 4

## 2016-09-24 MED ORDER — SODIUM CHLORIDE 0.9% FLUSH
10.0000 mL | INTRAVENOUS | Status: DC | PRN
  Administered 2016-09-24: 10 mL
  Filled 2016-09-24: qty 10

## 2016-09-24 MED ORDER — SODIUM CHLORIDE 0.9 % IV SOLN: Freq: Once | INTRAVENOUS | Status: DC

## 2016-09-24 NOTE — Patient Instructions (Signed)
Dehydration, Adult Dehydration is when there is not enough fluid or water in your body. This happens when you lose more fluids than you take in. Dehydration can range from mild to very bad. It should be treated right away to keep it from getting very bad. Symptoms of mild dehydration may include:  Thirst.  Dry lips.  Slightly dry mouth.  Dry, warm skin.  Dizziness. Symptoms of moderate dehydration may include:  Very dry mouth.  Muscle cramps.  Dark pee (urine). Pee may be the color of tea.  Your body making less pee.  Your eyes making fewer tears.  Heartbeat that is uneven or faster than normal (palpitations).  Headache.  Light-headedness, especially when you stand up from sitting.  Fainting (syncope). Symptoms of very bad dehydration may include:  Changes in skin, such as: ? Cold and clammy skin. ? Blotchy (mottled) or pale skin. ? Skin that does not quickly return to normal after being lightly pinched and let go (poor skin turgor).  Changes in body fluids, such as: ? Feeling very thirsty. ? Your eyes making fewer tears. ? Not sweating when body temperature is high, such as in hot weather. ? Your body making very little pee.  Changes in vital signs, such as: ? Weak pulse. ? Pulse that is more than 100 beats a minute when you are sitting still. ? Fast breathing. ? Low blood pressure.  Other changes, such as: ? Sunken eyes. ? Cold hands and feet. ? Confusion. ? Lack of energy (lethargy). ? Trouble waking up from sleep. ? Short-term weight loss. ? Unconsciousness. Follow these instructions at home:  If told by your doctor, drink an ORS: ? Make an ORS by using instructions on the package. ? Start by drinking small amounts, about  cup (120 mL) every 5-10 minutes. ? Slowly drink more until you have had the amount that your doctor said to have.  Drink enough clear fluid to keep your pee clear or pale yellow. If you were told to drink an ORS, finish the ORS  first, then start slowly drinking clear fluids. Drink fluids such as: ? Water. Do not drink only water by itself. Doing that can make the salt (sodium) level in your body get too low (hyponatremia). ? Ice chips. ? Fruit juice that you have added water to (diluted). ? Low-calorie sports drinks.  Avoid: ? Alcohol. ? Drinks that have a lot of sugar. These include high-calorie sports drinks, fruit juice that does not have water added, and soda. ? Caffeine. ? Foods that are greasy or have a lot of fat or sugar.  Take over-the-counter and prescription medicines only as told by your doctor.  Do not take salt tablets. Doing that can make the salt level in your body get too high (hypernatremia).  Eat foods that have minerals (electrolytes). Examples include bananas, oranges, potatoes, tomatoes, and spinach.  Keep all follow-up visits as told by your doctor. This is important. Contact a doctor if:  You have belly (abdominal) pain that: ? Gets worse. ? Stays in one area (localizes).  You have a rash.  You have a stiff neck.  You get angry or annoyed more easily than normal (irritability).  You are more sleepy than normal.  You have a harder time waking up than normal.  You feel: ? Weak. ? Dizzy. ? Very thirsty.  You have peed (urinated) only a small amount of very dark pee during 6-8 hours. Get help right away if:  You have symptoms of   very bad dehydration.  You cannot drink fluids without throwing up (vomiting).  Your symptoms get worse with treatment.  You have a fever.  You have a very bad headache.  You are throwing up or having watery poop (diarrhea) and it: ? Gets worse. ? Does not go away.  You have blood or something green (bile) in your throw-up.  You have blood in your poop (stool). This may cause poop to look black and tarry.  You have not peed in 6-8 hours.  You pass out (faint).  Your heart rate when you are sitting still is more than 100 beats a  minute.  You have trouble breathing. This information is not intended to replace advice given to you by your health care provider. Make sure you discuss any questions you have with your health care provider. Document Released: 01/15/2009 Document Revised: 10/09/2015 Document Reviewed: 05/15/2015 Elsevier Interactive Patient Education  2018 [employer]  

## 2016-09-26 ENCOUNTER — Ambulatory Visit
Admission: RE | Admit: 2016-09-26 | Discharge: 2016-09-26 | Disposition: A | Discharge status: Home / Self Care | Payer: BLUE CROSS/BLUE SHIELD | Source: Ambulatory Visit | Attending: Radiation Oncology | Admitting: Radiation Oncology

## 2016-09-26 VITALS — BP 131/112 | HR 114 | Temp 99.6°F | Wt 181.2 lb

## 2016-09-26 DIAGNOSIS — Z510 Encounter for antineoplastic radiation therapy: Secondary | ICD-10-CM | POA: Diagnosis not present

## 2016-09-26 DIAGNOSIS — C090 Malignant neoplasm of tonsillar fossa: Secondary | ICD-10-CM

## 2016-09-26 MED ORDER — BIAFINE EX EMUL
CUTANEOUS | Status: DC | PRN
  Administered 2016-09-26: 15:00:00 via TOPICAL

## 2016-09-26 NOTE — Progress Notes (Signed)
Douglas Norris presented to nursing for his weekly undertreat visit today. He was unable to wait to see Dr. Isidore Moos and left without her assessment. I was able to assess him and noted decreased water intake. He told me that he is only receiving about 16 ounces of water daily and 6 cans of ensure/osmolite daily. He mentioned that he would like IV fluids scheduled to supplement his fluid intake this Wednesday and Friday. He saw medical oncology last week and IV fluids were not addressed at that time. Dr. Isidore Moos will order IV Fluids for Wednesday and Friday and will also request them for Tuesday if available. I will inform Douglas Norris of his schedule once times have been verified. Dr. Isidore Moos will see Douglas Norris for weekly undertreat assessment tomorrow Tuesday 09/27/16.

## 2016-09-27 ENCOUNTER — Ambulatory Visit (HOSPITAL_BASED_OUTPATIENT_CLINIC_OR_DEPARTMENT_OTHER): Payer: BLUE CROSS/BLUE SHIELD

## 2016-09-27 ENCOUNTER — Ambulatory Visit
Admission: RE | Admit: 2016-09-27 | Discharge: 2016-09-27 | Disposition: A | Discharge status: Home / Self Care | Payer: BLUE CROSS/BLUE SHIELD | Source: Ambulatory Visit | Attending: Radiation Oncology | Admitting: Radiation Oncology

## 2016-09-27 VITALS — BP 142/88 | HR 103 | Temp 99.0°F | Resp 18 | Ht 68.0 in | Wt 179.8 lb

## 2016-09-27 DIAGNOSIS — Z510 Encounter for antineoplastic radiation therapy: Secondary | ICD-10-CM | POA: Diagnosis not present

## 2016-09-27 DIAGNOSIS — C090 Malignant neoplasm of tonsillar fossa: Secondary | ICD-10-CM

## 2016-09-27 MED ORDER — HEPARIN SOD (PORK) LOCK FLUSH 100 UNIT/ML IV SOLN
500.0000 [IU] | Freq: Once | INTRAVENOUS | Status: DC | PRN
  Filled 2016-09-27: qty 5

## 2016-09-27 MED ORDER — SODIUM CHLORIDE 0.9 % IV SOLN
Freq: Once | INTRAVENOUS | Status: AC
  Administered 2016-09-27: 15:00:00 via INTRAVENOUS

## 2016-09-27 MED ORDER — SODIUM CHLORIDE 0.9% FLUSH
10.0000 mL | INTRAVENOUS | Status: DC | PRN
  Filled 2016-09-27: qty 10

## 2016-09-27 NOTE — Patient Instructions (Signed)
Dehydration, Adult Dehydration is a condition in which there is not enough fluid or water in the body. This happens when you lose more fluids than you take in. Important organs, such as the kidneys, brain, and heart, cannot function without a proper amount of fluids. Any loss of fluids from the body can lead to dehydration. Dehydration can range from mild to severe. This condition should be treated right away to prevent it from becoming severe. What are the causes? This condition may be caused by:  Vomiting.  Diarrhea.  Excessive sweating, such as from heat exposure or exercise.  Not drinking enough fluid, especially: ? When ill. ? While doing activity that requires a lot of energy.  Excessive urination.  Fever.  Infection.  Certain medicines, such as medicines that cause the body to lose excess fluid (diuretics).  Inability to access safe drinking water.  Reduced physical ability to get adequate water and food.  What increases the risk? This condition is more likely to develop in people:  Who have a poorly controlled long-term (chronic) illness, such as diabetes, heart disease, or kidney disease.  Who are age 65 or older.  Who are disabled.  Who live in a place with high altitude.  Who play endurance sports.  What are the signs or symptoms? Symptoms of mild dehydration may include:  Thirst.  Dry lips.  Slightly dry mouth.  Dry, warm skin.  Dizziness. Symptoms of moderate dehydration may include:  Very dry mouth.  Muscle cramps.  Dark urine. Urine may be the color of tea.  Decreased urine production.  Decreased tear production.  Heartbeat that is irregular or faster than normal (palpitations).  Headache.  Light-headedness, especially when you stand up from a sitting position.  Fainting (syncope). Symptoms of severe dehydration may include:  Changes in skin, such as: ? Cold and clammy skin. ? Blotchy (mottled) or pale skin. ? Skin that does  not quickly return to normal after being lightly pinched and released (poor skin turgor).  Changes in body fluids, such as: ? Extreme thirst. ? No tear production. ? Inability to sweat when body temperature is high, such as in hot weather. ? Very little urine production.  Changes in vital signs, such as: ? Weak pulse. ? Pulse that is more than 100 beats a minute when sitting still. ? Rapid breathing. ? Low blood pressure.  Other changes, such as: ? Sunken eyes. ? Cold hands and feet. ? Confusion. ? Lack of energy (lethargy). ? Difficulty waking up from sleep. ? Short-term weight loss. ? Unconsciousness. How is this diagnosed? This condition is diagnosed based on your symptoms and a physical exam. Blood and urine tests may be done to help confirm the diagnosis. How is this treated? Treatment for this condition depends on the severity. Mild or moderate dehydration can often be treated at home. Treatment should be started right away. Do not wait until dehydration becomes severe. Severe dehydration is an emergency and it needs to be treated in a hospital. Treatment for mild dehydration may include:  Drinking more fluids.  Replacing salts and minerals in your blood (electrolytes) that you may have lost. Treatment for moderate dehydration may include:  Drinking an oral rehydration solution (ORS). This is a drink that helps you replace fluids and electrolytes (rehydrate). It can be found at pharmacies and retail stores. Treatment for severe dehydration may include:  Receiving fluids through an IV tube.  Receiving an electrolyte solution through a feeding tube that is passed through your nose   and into your stomach (nasogastric tube, or NG tube).  Correcting any abnormalities in electrolytes.  Treating the underlying cause of dehydration. Follow these instructions at home:  If directed by your health care provider, drink an ORS: ? Make an ORS by following instructions on the  package. ? Start by drinking small amounts, about  cup (120 mL) every 5-10 minutes. ? Slowly increase how much you drink until you have taken the amount recommended by your health care provider.  Drink enough clear fluid to keep your urine clear or pale yellow. If you were told to drink an ORS, finish the ORS first, then start slowly drinking other clear fluids. Drink fluids such as: ? Water. Do not drink only water. Doing that can lead to having too little salt (sodium) in the body (hyponatremia). ? Ice chips. ? Fruit juice that you have added water to (diluted fruit juice). ? Low-calorie sports drinks.  Avoid: ? Alcohol. ? Drinks that contain a lot of sugar. These include high-calorie sports drinks, fruit juice that is not diluted, and soda. ? Caffeine. ? Foods that are greasy or contain a lot of fat or sugar.  Take over-the-counter and prescription medicines only as told by your health care provider.  Do not take sodium tablets. This can lead to having too much sodium in the body (hypernatremia).  Eat foods that contain a healthy balance of electrolytes, such as bananas, oranges, potatoes, tomatoes, and spinach.  Keep all follow-up visits as told by your health care provider. This is important. Contact a health care provider if:  You have abdominal pain that: ? Gets worse. ? Stays in one area (localizes).  You have a rash.  You have a stiff neck.  You are more irritable than usual.  You are sleepier or more difficult to wake up than usual.  You feel weak or dizzy.  You feel very thirsty.  You have urinated only a small amount of very dark urine over 6-8 hours. Get help right away if:  You have symptoms of severe dehydration.  You cannot drink fluids without vomiting.  Your symptoms get worse with treatment.  You have a fever.  You have a severe headache.  You have vomiting or diarrhea that: ? Gets worse. ? Does not go away.  You have blood or green matter  (bile) in your vomit.  You have blood in your stool. This may cause stool to look black and tarry.  You have not urinated in 6-8 hours.  You faint.  Your heart rate while sitting still is over 100 beats a minute.  You have trouble breathing. This information is not intended to replace advice given to you by your health care provider. Make sure you discuss any questions you have with your health care provider. Document Released: 03/21/2005 Document Revised: 10/16/2015 Document Reviewed: 05/15/2015 Elsevier Interactive Patient Education  2018 [employer]  

## 2016-09-27 NOTE — Progress Notes (Signed)
Ok to give IV fluids @ 750/hr

## 2016-09-28 ENCOUNTER — Ambulatory Visit: Payer: BLUE CROSS/BLUE SHIELD

## 2016-09-28 ENCOUNTER — Encounter: Payer: Self-pay | Admitting: Nutrition

## 2016-09-28 ENCOUNTER — Ambulatory Visit (HOSPITAL_BASED_OUTPATIENT_CLINIC_OR_DEPARTMENT_OTHER): Payer: BLUE CROSS/BLUE SHIELD

## 2016-09-28 ENCOUNTER — Ambulatory Visit
Admission: RE | Admit: 2016-09-28 | Discharge: 2016-09-28 | Disposition: A | Discharge status: Home / Self Care | Payer: BLUE CROSS/BLUE SHIELD | Source: Ambulatory Visit | Attending: Radiation Oncology | Admitting: Radiation Oncology

## 2016-09-28 VITALS — BP 136/82 | HR 96 | Temp 98.6°F | Resp 20 | Ht 68.0 in | Wt 180.0 lb

## 2016-09-28 DIAGNOSIS — C090 Malignant neoplasm of tonsillar fossa: Secondary | ICD-10-CM

## 2016-09-28 DIAGNOSIS — Z510 Encounter for antineoplastic radiation therapy: Secondary | ICD-10-CM | POA: Diagnosis not present

## 2016-09-28 MED ORDER — SODIUM CHLORIDE 0.9 % IV SOLN
Freq: Once | INTRAVENOUS | Status: AC
  Administered 2016-09-28: 15:00:00 via INTRAVENOUS

## 2016-09-28 MED ORDER — SODIUM CHLORIDE 0.9% FLUSH
10.0000 mL | INTRAVENOUS | Status: DC | PRN
  Filled 2016-09-28: qty 10

## 2016-09-28 MED ORDER — HEPARIN SOD (PORK) LOCK FLUSH 100 UNIT/ML IV SOLN
500.0000 [IU] | Freq: Once | INTRAVENOUS | Status: DC | PRN
  Filled 2016-09-28: qty 5

## 2016-09-28 NOTE — Patient Instructions (Signed)
Dehydration, Adult Dehydration is a condition in which there is not enough fluid or water in the body. This happens when you lose more fluids than you take in. Important organs, such as the kidneys, brain, and heart, cannot function without a proper amount of fluids. Any loss of fluids from the body can lead to dehydration. Dehydration can range from mild to severe. This condition should be treated right away to prevent it from becoming severe. What are the causes? This condition may be caused by:  Vomiting.  Diarrhea.  Excessive sweating, such as from heat exposure or exercise.  Not drinking enough fluid, especially: ? When ill. ? While doing activity that requires a lot of energy.  Excessive urination.  Fever.  Infection.  Certain medicines, such as medicines that cause the body to lose excess fluid (diuretics).  Inability to access safe drinking water.  Reduced physical ability to get adequate water and food.  What increases the risk? This condition is more likely to develop in people:  Who have a poorly controlled long-term (chronic) illness, such as diabetes, heart disease, or kidney disease.  Who are age 65 or older.  Who are disabled.  Who live in a place with high altitude.  Who play endurance sports.  What are the signs or symptoms? Symptoms of mild dehydration may include:  Thirst.  Dry lips.  Slightly dry mouth.  Dry, warm skin.  Dizziness. Symptoms of moderate dehydration may include:  Very dry mouth.  Muscle cramps.  Dark urine. Urine may be the color of tea.  Decreased urine production.  Decreased tear production.  Heartbeat that is irregular or faster than normal (palpitations).  Headache.  Light-headedness, especially when you stand up from a sitting position.  Fainting (syncope). Symptoms of severe dehydration may include:  Changes in skin, such as: ? Cold and clammy skin. ? Blotchy (mottled) or pale skin. ? Skin that does  not quickly return to normal after being lightly pinched and released (poor skin turgor).  Changes in body fluids, such as: ? Extreme thirst. ? No tear production. ? Inability to sweat when body temperature is high, such as in hot weather. ? Very little urine production.  Changes in vital signs, such as: ? Weak pulse. ? Pulse that is more than 100 beats a minute when sitting still. ? Rapid breathing. ? Low blood pressure.  Other changes, such as: ? Sunken eyes. ? Cold hands and feet. ? Confusion. ? Lack of energy (lethargy). ? Difficulty waking up from sleep. ? Short-term weight loss. ? Unconsciousness. How is this diagnosed? This condition is diagnosed based on your symptoms and a physical exam. Blood and urine tests may be done to help confirm the diagnosis. How is this treated? Treatment for this condition depends on the severity. Mild or moderate dehydration can often be treated at home. Treatment should be started right away. Do not wait until dehydration becomes severe. Severe dehydration is an emergency and it needs to be treated in a hospital. Treatment for mild dehydration may include:  Drinking more fluids.  Replacing salts and minerals in your blood (electrolytes) that you may have lost. Treatment for moderate dehydration may include:  Drinking an oral rehydration solution (ORS). This is a drink that helps you replace fluids and electrolytes (rehydrate). It can be found at pharmacies and retail stores. Treatment for severe dehydration may include:  Receiving fluids through an IV tube.  Receiving an electrolyte solution through a feeding tube that is passed through your nose   and into your stomach (nasogastric tube, or NG tube).  Correcting any abnormalities in electrolytes.  Treating the underlying cause of dehydration. Follow these instructions at home:  If directed by your health care provider, drink an ORS: ? Make an ORS by following instructions on the  package. ? Start by drinking small amounts, about  cup (120 mL) every 5-10 minutes. ? Slowly increase how much you drink until you have taken the amount recommended by your health care provider.  Drink enough clear fluid to keep your urine clear or pale yellow. If you were told to drink an ORS, finish the ORS first, then start slowly drinking other clear fluids. Drink fluids such as: ? Water. Do not drink only water. Doing that can lead to having too little salt (sodium) in the body (hyponatremia). ? Ice chips. ? Fruit juice that you have added water to (diluted fruit juice). ? Low-calorie sports drinks.  Avoid: ? Alcohol. ? Drinks that contain a lot of sugar. These include high-calorie sports drinks, fruit juice that is not diluted, and soda. ? Caffeine. ? Foods that are greasy or contain a lot of fat or sugar.  Take over-the-counter and prescription medicines only as told by your health care provider.  Do not take sodium tablets. This can lead to having too much sodium in the body (hypernatremia).  Eat foods that contain a healthy balance of electrolytes, such as bananas, oranges, potatoes, tomatoes, and spinach.  Keep all follow-up visits as told by your health care provider. This is important. Contact a health care provider if:  You have abdominal pain that: ? Gets worse. ? Stays in one area (localizes).  You have a rash.  You have a stiff neck.  You are more irritable than usual.  You are sleepier or more difficult to wake up than usual.  You feel weak or dizzy.  You feel very thirsty.  You have urinated only a small amount of very dark urine over 6-8 hours. Get help right away if:  You have symptoms of severe dehydration.  You cannot drink fluids without vomiting.  Your symptoms get worse with treatment.  You have a fever.  You have a severe headache.  You have vomiting or diarrhea that: ? Gets worse. ? Does not go away.  You have blood or green matter  (bile) in your vomit.  You have blood in your stool. This may cause stool to look black and tarry.  You have not urinated in 6-8 hours.  You faint.  Your heart rate while sitting still is over 100 beats a minute.  You have trouble breathing. This information is not intended to replace advice given to you by your health care provider. Make sure you discuss any questions you have with your health care provider. Document Released: 03/21/2005 Document Revised: 10/16/2015 Document Reviewed: 05/15/2015 Elsevier Interactive Patient Education  2018 [employer]  

## 2016-09-29 ENCOUNTER — Ambulatory Visit
Admission: RE | Admit: 2016-09-29 | Discharge: 2016-09-29 | Disposition: A | Discharge status: Home / Self Care | Payer: BLUE CROSS/BLUE SHIELD | Source: Ambulatory Visit | Attending: Radiation Oncology | Admitting: Radiation Oncology

## 2016-09-29 ENCOUNTER — Encounter: Payer: Self-pay | Admitting: *Deleted

## 2016-09-29 DIAGNOSIS — Z510 Encounter for antineoplastic radiation therapy: Secondary | ICD-10-CM | POA: Diagnosis not present

## 2016-09-29 NOTE — Progress Notes (Addendum)
Oncology Nurse Navigator Documentation  Met with Douglas Norris during final RT to offer support and to celebrate end of radiation treatment.  He was accompanied by his wife. I provided wife with a Certificate of Recognition for her supportive care. I provided verbal and written post-RT guidance, including:  Importance of keeping follow-up appts with Nutrition and SLP.  Importance of protecting treatment area from sun.  Importance of continuing swallowing exercises per SLP guidance.  Continuation of Biafine application 2-3 times daily, when supply exhausted, to use OTC lotion containing vitamin E. I explained that my role as navigator will continue for several more months and that I will be calling and/or joining him during follow-up visits.   I encouraged him to call me with needs/concerns.   Patient and wife verbalized understanding of information provided.  Gayleen Orem, RN, BSN, [facility] [place] [phone]

## 2016-09-30 ENCOUNTER — Ambulatory Visit (HOSPITAL_BASED_OUTPATIENT_CLINIC_OR_DEPARTMENT_OTHER): Payer: BLUE CROSS/BLUE SHIELD

## 2016-09-30 VITALS — BP 139/92 | HR 102 | Temp 98.2°F | Resp 20 | Ht 68.0 in | Wt 177.8 lb

## 2016-09-30 DIAGNOSIS — C090 Malignant neoplasm of tonsillar fossa: Secondary | ICD-10-CM | POA: Diagnosis not present

## 2016-09-30 DIAGNOSIS — R110 Nausea: Secondary | ICD-10-CM

## 2016-09-30 MED ORDER — SODIUM CHLORIDE 0.9 % IV SOLN
Freq: Once | INTRAVENOUS | Status: AC
  Administered 2016-09-30: 13:00:00 via INTRAVENOUS

## 2016-09-30 MED ORDER — HEPARIN SOD (PORK) LOCK FLUSH 100 UNIT/ML IV SOLN
500.0000 [IU] | Freq: Once | INTRAVENOUS | Status: DC | PRN
  Filled 2016-09-30: qty 5

## 2016-09-30 MED ORDER — SODIUM CHLORIDE 0.9% FLUSH
10.0000 mL | INTRAVENOUS | Status: DC | PRN
  Filled 2016-09-30: qty 10

## 2016-09-30 NOTE — Progress Notes (Signed)
No further appts scheduled for IV fluids.  Pt states he needs some time away from the cancer center. Encouraged pt to call if exhibiting any s/s dehydration; to keep up with his feedings through his PEG and fluids by mouth as tolerated.  Pt verbalized understanding. Pt's weight down 3 lbs since yesterday.  Gayleen Orem, navigator,made aware. P t has only 1 f/u appt with Dr. Isidore Moos on 10/12/16

## 2016-09-30 NOTE — Patient Instructions (Signed)
Dehydration, Adult Dehydration is a condition in which there is not enough fluid or water in the body. This happens when you lose more fluids than you take in. Important organs, such as the kidneys, brain, and heart, cannot function without a proper amount of fluids. Any loss of fluids from the body can lead to dehydration. Dehydration can range from mild to severe. This condition should be treated right away to prevent it from becoming severe. What are the causes? This condition may be caused by:  Vomiting.  Diarrhea.  Excessive sweating, such as from heat exposure or exercise.  Not drinking enough fluid, especially: ? When ill. ? While doing activity that requires a lot of energy.  Excessive urination.  Fever.  Infection.  Certain medicines, such as medicines that cause the body to lose excess fluid (diuretics).  Inability to access safe drinking water.  Reduced physical ability to get adequate water and food.  What increases the risk? This condition is more likely to develop in people:  Who have a poorly controlled long-term (chronic) illness, such as diabetes, heart disease, or kidney disease.  Who are age 65 or older.  Who are disabled.  Who live in a place with high altitude.  Who play endurance sports.  What are the signs or symptoms? Symptoms of mild dehydration may include:  Thirst.  Dry lips.  Slightly dry mouth.  Dry, warm skin.  Dizziness. Symptoms of moderate dehydration may include:  Very dry mouth.  Muscle cramps.  Dark urine. Urine may be the color of tea.  Decreased urine production.  Decreased tear production.  Heartbeat that is irregular or faster than normal (palpitations).  Headache.  Light-headedness, especially when you stand up from a sitting position.  Fainting (syncope). Symptoms of severe dehydration may include:  Changes in skin, such as: ? Cold and clammy skin. ? Blotchy (mottled) or pale skin. ? Skin that does  not quickly return to normal after being lightly pinched and released (poor skin turgor).  Changes in body fluids, such as: ? Extreme thirst. ? No tear production. ? Inability to sweat when body temperature is high, such as in hot weather. ? Very little urine production.  Changes in vital signs, such as: ? Weak pulse. ? Pulse that is more than 100 beats a minute when sitting still. ? Rapid breathing. ? Low blood pressure.  Other changes, such as: ? Sunken eyes. ? Cold hands and feet. ? Confusion. ? Lack of energy (lethargy). ? Difficulty waking up from sleep. ? Short-term weight loss. ? Unconsciousness. How is this diagnosed? This condition is diagnosed based on your symptoms and a physical exam. Blood and urine tests may be done to help confirm the diagnosis. How is this treated? Treatment for this condition depends on the severity. Mild or moderate dehydration can often be treated at home. Treatment should be started right away. Do not wait until dehydration becomes severe. Severe dehydration is an emergency and it needs to be treated in a hospital. Treatment for mild dehydration may include:  Drinking more fluids.  Replacing salts and minerals in your blood (electrolytes) that you may have lost. Treatment for moderate dehydration may include:  Drinking an oral rehydration solution (ORS). This is a drink that helps you replace fluids and electrolytes (rehydrate). It can be found at pharmacies and retail stores. Treatment for severe dehydration may include:  Receiving fluids through an IV tube.  Receiving an electrolyte solution through a feeding tube that is passed through your nose   and into your stomach (nasogastric tube, or NG tube).  Correcting any abnormalities in electrolytes.  Treating the underlying cause of dehydration. Follow these instructions at home:  If directed by your health care provider, drink an ORS: ? Make an ORS by following instructions on the  package. ? Start by drinking small amounts, about  cup (120 mL) every 5-10 minutes. ? Slowly increase how much you drink until you have taken the amount recommended by your health care provider.  Drink enough clear fluid to keep your urine clear or pale yellow. If you were told to drink an ORS, finish the ORS first, then start slowly drinking other clear fluids. Drink fluids such as: ? Water. Do not drink only water. Doing that can lead to having too little salt (sodium) in the body (hyponatremia). ? Ice chips. ? Fruit juice that you have added water to (diluted fruit juice). ? Low-calorie sports drinks.  Avoid: ? Alcohol. ? Drinks that contain a lot of sugar. These include high-calorie sports drinks, fruit juice that is not diluted, and soda. ? Caffeine. ? Foods that are greasy or contain a lot of fat or sugar.  Take over-the-counter and prescription medicines only as told by your health care provider.  Do not take sodium tablets. This can lead to having too much sodium in the body (hypernatremia).  Eat foods that contain a healthy balance of electrolytes, such as bananas, oranges, potatoes, tomatoes, and spinach.  Keep all follow-up visits as told by your health care provider. This is important. Contact a health care provider if:  You have abdominal pain that: ? Gets worse. ? Stays in one area (localizes).  You have a rash.  You have a stiff neck.  You are more irritable than usual.  You are sleepier or more difficult to wake up than usual.  You feel weak or dizzy.  You feel very thirsty.  You have urinated only a small amount of very dark urine over 6-8 hours. Get help right away if:  You have symptoms of severe dehydration.  You cannot drink fluids without vomiting.  Your symptoms get worse with treatment.  You have a fever.  You have a severe headache.  You have vomiting or diarrhea that: ? Gets worse. ? Does not go away.  You have blood or green matter  (bile) in your vomit.  You have blood in your stool. This may cause stool to look black and tarry.  You have not urinated in 6-8 hours.  You faint.  Your heart rate while sitting still is over 100 beats a minute.  You have trouble breathing. This information is not intended to replace advice given to you by your health care provider. Make sure you discuss any questions you have with your health care provider. Document Released: 03/21/2005 Document Revised: 10/16/2015 Document Reviewed: 05/15/2015 Elsevier Interactive Patient Education  2018 [employer]  

## 2016-10-04 ENCOUNTER — Encounter: Payer: Self-pay | Admitting: Radiation Oncology

## 2016-10-04 NOTE — Progress Notes (Signed)
  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 He had expected salivary changes, mucosal irritation, and skin irritation. He had significant nausea.  IV fluids were offered regularly.  Plan: The patient has completed radiation treatment. The patient will return to radiation oncology clinic for routine followup in one half month. I advised the patient to call or return sooner if any questions or concerns arise that are related to recovery or treatment.  -----------------------------------  Eppie Gibson, MD  This document serves as a record of services personally performed by Eppie Gibson, MD. It was created on her behalf by Maryla Morrow, a trained medical scribe. The creation of this record is based on the scribe's personal observations and the provider's statements to them. This document has been checked and approved by the attending provider.

## 2016-10-07 ENCOUNTER — Encounter: Payer: Self-pay | Admitting: Radiation Oncology

## 2016-10-07 ENCOUNTER — Telehealth: Payer: Self-pay | Admitting: *Deleted

## 2016-10-07 NOTE — Progress Notes (Signed)
Mr. Burch presents for follow up of radiation completed 09/29/16 to his Left Tonsil.    Pain issues, if any: He reports a sore throat. He continues to use morphine soln for pain relief.  Using a feeding tube?: Yes, he is instilling 6 cans of osmolite daily. He is instilling about 60 ounces of water daily.  Weight changes, if any:  Wt Readings from Last 3 Encounters:  10/12/16 176 lb 9.6 oz (80.1 kg)  09/30/16 177 lb 12.8 oz (80.6 kg)  09/28/16 180 lb (81.6 kg)   Swallowing issues, if any: He has pain when swallowing on the left side of his mouth and throat. He reports continued sore to the left side of his mouth.  Smoking or chewing tobacco? He denies.  Using fluoride trays daily? no Last ENT visit was on: Not since diagnosis Other notable issues, if any:  He continues to report a dry throat and mouth.  His skin has healed. It remains red. He is using aloe vera lotion in the morning and biafine cream at night. He will switch to a vitamin E cream when the biafine is completed.   BP (!) 122/91   Pulse (!) 107   Temp 98.5 F (36.9 C)   Ht 5\' 8"  (1.727 m)   Wt 176 lb 9.6 oz (80.1 kg)   SpO2 99% Comment: room air  BMI 26.85 kg/m

## 2016-10-07 NOTE — Telephone Encounter (Signed)
TCT patient to follow up on his status.  Pt last had IV fluids 1 week ago. Called to see how he was doing with his PEG feedings and oral intake. No answer. Left VM for pt to call back at his earliest convenience.  Pt has an appt with Dr. Isidore Moos on 10/12/16 Will be scheduled to see Dr. Alvy Bimler the following week.

## 2016-10-12 ENCOUNTER — Ambulatory Visit
Admission: RE | Admit: 2016-10-12 | Discharge: 2016-10-12 | Disposition: A | Discharge status: Home / Self Care | Payer: BLUE CROSS/BLUE SHIELD | Source: Ambulatory Visit | Attending: Radiation Oncology | Admitting: Radiation Oncology

## 2016-10-12 ENCOUNTER — Encounter: Payer: Self-pay | Admitting: Radiation Oncology

## 2016-10-12 DIAGNOSIS — Z809 Family history of malignant neoplasm, unspecified: Secondary | ICD-10-CM | POA: Diagnosis not present

## 2016-10-12 DIAGNOSIS — C090 Malignant neoplasm of tonsillar fossa: Secondary | ICD-10-CM | POA: Insufficient documentation

## 2016-10-12 DIAGNOSIS — Z79899 Other long term (current) drug therapy: Secondary | ICD-10-CM | POA: Diagnosis not present

## 2016-10-12 DIAGNOSIS — Z87891 Personal history of nicotine dependence: Secondary | ICD-10-CM | POA: Insufficient documentation

## 2016-10-12 DIAGNOSIS — R509 Fever, unspecified: Secondary | ICD-10-CM | POA: Insufficient documentation

## 2016-10-12 DIAGNOSIS — C770 Secondary and unspecified malignant neoplasm of lymph nodes of head, face and neck: Secondary | ICD-10-CM | POA: Insufficient documentation

## 2016-10-12 DIAGNOSIS — Z510 Encounter for antineoplastic radiation therapy: Secondary | ICD-10-CM | POA: Insufficient documentation

## 2016-10-12 HISTORY — DX: Personal history of irradiation: Z92.3

## 2016-10-12 NOTE — Progress Notes (Signed)
Radiation Oncology         [phone] ________________________________  Name: Douglas Norris MRN: [id number]  Date: 10/12/2016  DOB: 18-Jan-1956  Follow-Up Visit Note  CC: Patient, No Pcp Per  Rozetta Nunnery, *  Diagnosis and Prior Radiotherapy:       ICD-10-CM   1. 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  Narrative:  The patient returns today for routine follow-up. He completed radiation to his left tonsil on 09/29/2016. Patient reports having pain and trouble swallowing due to pain.  Still on morphine via med/onc. Instilling 6 cans daily and 60 ounces water.    Maintaining weight within 1 lb.  Still tired.                ALLERGIES:  has No Known Allergies.  Meds: Current Outpatient Prescriptions  Medication Sig Dispense Refill  . Morphine Sulfate (MORPHINE CONCENTRATE) 10 mg / 0.5 ml concentrated solution Take 0.5 mLs (10 mg total) by mouth every 2 (two) hours as needed for severe pain. 240 mL 0  . scopolamine (TRANSDERM-SCOP) 1 MG/3DAYS Place 1 patch (1.5 mg total) onto the skin every 3 (three) days. 10 patch 12  . triamcinolone (NASACORT) 55 MCG/ACT AERO nasal inhaler Place 2 sprays into the nose daily. 1 Inhaler 12  . acetaminophen (TYLENOL) 325 MG tablet Take 650 mg by mouth every 6 (six) hours as needed for mild pain or headache.     . fentaNYL (DURAGESIC - DOSED MCG/HR) 25 MCG/HR patch Place 1 patch (25 mcg total) onto the skin every 3 (three) days. (Patient not taking: Reported on 09/12/2016) 5 patch 0  . lidocaine (XYLOCAINE) 2 % solution Patient: Mix 1part 2% viscous lidocaine, 1part H20. Swish & swallow 63mL of diluted mixture, 3min before meals and at bedtime, up to QID (Patient not taking: Reported on 09/17/2016) 100 mL 5  . Melatonin 10 MG TABS Take 10 mg  by mouth at bedtime as needed (sleep).     . Multiple Vitamin (MULTIVITAMIN) tablet Take 1 tablet by mouth daily.    . Omega-3 Fatty Acids (FISH OIL OMEGA-3 PO) Take by mouth.    Marland Kitchen VALERIAN ROOT PO Take 1 tablet by mouth daily.      Current Facility-Administered Medications  Medication Dose Route Frequency Provider Last Rate Last Dose  . ondansetron (ZOFRAN) 8 mg in sodium chloride 0.9 % 50 mL IVPB   Intravenous Once Heath Lark, MD        Physical Findings: The patient is in no acute distress. Patient is alert and oriented. Wt Readings from Last 3 Encounters:  10/12/16 176 lb 9.6 oz (80.1 kg)  09/30/16 177 lb 12.8 oz (80.6 kg)  09/28/16 180 lb (81.6 kg)    height is 5\' 8"  (1.727 m) and weight is 176 lb 9.6 oz (80.1 kg). His temperature is 98.5 F (36.9 C). His blood pressure is 122/91 (abnormal) and his pulse is 107 (abnormal). His oxygen saturation is 99%. .  General: Alert and oriented, in no acute distress HEENT: Head is normocephalic. Extraocular movements are intact. Oropharynx is notable for mucositis in left tonsillar region (confluent) and an ulcer on the left posterior part of the tongue Neck: Neck is notable for  palpable mass in L level 4 region, measuring 1.5 - 2  cm at greatest dimension.  Skin: Skin in treatment fields shows satisfactory healing  Lymphatics: see Neck Exam Psychiatric: Judgment and insight are intact. Affect is appropriate.   Lab Findings: Lab Results  Component Value Date   WBC 4.9 09/14/2016   HGB 12.9 (L) 09/16/2016   HCT 38.0 (L) 09/16/2016   MCV 87.4 09/14/2016   PLT 290 09/14/2016    Lab Results  Component Value Date   TSH 2.205 07/26/2016    Radiographic Findings: No results found.  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    He has a palpable mass in the level IV region.  1.5 -2cm in dimension.  In retrospect,  there was a 1.7 cm mass in this region at the time of CT/ PET staging. It was cold on PET.  It might be palpable now due to weight loss. This node (or benign mass?) was in the RT fields, but not treated to a full dose of 70Gy  The patient will be discussed next week at tumor board with radiology review.  2) Nutritional Status: Stabilizing and still using PEG.  3) Risk Factors: The patient has been educated about risk factors including alcohol and tobacco abuse; they understand that avoidance of alcohol and tobacco is important to prevent recurrences as well as other cancers.  4) Swallowing: Still has difficulty swallowing - continue to treat pain. Refer back to SLP.  5) Dental: Encouraged to continue regular followup with dentistry, and dental hygiene including fluoride rinses.   6) Thyroid function: recheck in 2-3 mo Lab Results  Component Value Date   TSH 2.205 07/26/2016    7) Follow-up in end of September of 2018 after PET scan and I will order a TSH lab at that time.The patient was encouraged to call with any issues or questions before then. [place] followup clinic to aid in recovery.  Discussion at tumor board as above. I've also sent a note to his oncologic/ENT MDs re: the level 4 mass on the left of unknown significance. _____________________________________   Eppie Gibson, MD This document serves as a record of services personally performed by Eppie Gibson, MD. It was created on her behalf by Valeta Harms, a trained medical scribe. The creation of this record is based on the scribe's personal observations and the provider's statements to them. This document has been checked and approved by the attending provider.

## 2016-10-13 ENCOUNTER — Encounter: Payer: Self-pay | Admitting: *Deleted

## 2016-10-13 ENCOUNTER — Telehealth: Payer: Self-pay | Admitting: Hematology and Oncology

## 2016-10-13 NOTE — Progress Notes (Signed)
Oncology Nurse Navigator Documentation  Met with Mr. Douglas Norris during post-tmt follow-up with Dr. Isidore Moos.  He completed RT for L tonsil ISCC 09/29/16, completed HD Cisplatin 09/09/16. He reported:  Some throat pain, taking PRN morphine sulfate at HS only to help with sleep.  Trying to eat with some success soft foods.  I encouraged experimentation with a wide variety of soft foods, eg Greek yogurt, scrambled/soft boiled eggs.  Instilling 6-7 Osmolite daily through PEG.  He understands goal of 100% oral intake and stable weight maintenance before PEG can be removed.  Post-treatment fatigue.  I explained it will take several months for energy to return, encouraged regular exercise to build stamina.  Mentioned [employer] Program, encouraged him to consider as he is further out from EOT.  Minimal skin irritation in treatment area.  Skin erythemetous, intact.  I encouraged continuation of BID-TID application of Biafine, application of OTC lotion with vitamin E when supply exhausted. I discussed the upcoming September [facility], encouraged him and his wife to attend.  He understands further information will follow. He indicated he wishes to continue follow-up with Dr. Alvy Bimler, including PAC flushes.  He understands PAC will remain in place until re-staging PET is completed and there is no indication further chemo is warranted. He understands to contact me with needs/concerns.  Gayleen Orem, RN, BSN, [place] [facility]ck Oncology Nurse [place] at [place] [phone]

## 2016-10-13 NOTE — Telephone Encounter (Signed)
lvm to inform pt of 7/16 appt at 230 pm per sch msg

## 2016-10-14 ENCOUNTER — Other Ambulatory Visit: Payer: Self-pay | Admitting: Radiation Oncology

## 2016-10-14 DIAGNOSIS — C090 Malignant neoplasm of tonsillar fossa: Secondary | ICD-10-CM

## 2016-10-14 DIAGNOSIS — Z1329 Encounter for screening for other suspected endocrine disorder: Secondary | ICD-10-CM

## 2016-10-14 DIAGNOSIS — R5381 Other malaise: Secondary | ICD-10-CM

## 2016-10-14 DIAGNOSIS — R5383 Other fatigue: Secondary | ICD-10-CM

## 2016-10-17 ENCOUNTER — Encounter: Payer: Self-pay | Admitting: *Deleted

## 2016-10-17 ENCOUNTER — Ambulatory Visit (HOSPITAL_BASED_OUTPATIENT_CLINIC_OR_DEPARTMENT_OTHER): Payer: BLUE CROSS/BLUE SHIELD | Admitting: Hematology and Oncology

## 2016-10-17 ENCOUNTER — Other Ambulatory Visit (HOSPITAL_BASED_OUTPATIENT_CLINIC_OR_DEPARTMENT_OTHER): Payer: BLUE CROSS/BLUE SHIELD

## 2016-10-17 DIAGNOSIS — R5383 Other fatigue: Secondary | ICD-10-CM

## 2016-10-17 DIAGNOSIS — R590 Localized enlarged lymph nodes: Secondary | ICD-10-CM | POA: Diagnosis not present

## 2016-10-17 DIAGNOSIS — R1310 Dysphagia, unspecified: Secondary | ICD-10-CM | POA: Diagnosis not present

## 2016-10-17 DIAGNOSIS — C090 Malignant neoplasm of tonsillar fossa: Secondary | ICD-10-CM

## 2016-10-17 DIAGNOSIS — K1231 Oral mucositis (ulcerative) due to antineoplastic therapy: Secondary | ICD-10-CM | POA: Diagnosis not present

## 2016-10-17 DIAGNOSIS — R5381 Other malaise: Secondary | ICD-10-CM

## 2016-10-17 DIAGNOSIS — Z1329 Encounter for screening for other suspected endocrine disorder: Secondary | ICD-10-CM

## 2016-10-17 LAB — MAGNESIUM: Magnesium: 2.3 mg/dl (ref 1.5–2.5)

## 2016-10-17 LAB — COMPREHENSIVE METABOLIC PANEL
ALT: 20 U/L (ref 0–55)
AST: 16 U/L (ref 5–34)
Albumin: 3.6 g/dL (ref 3.5–5.0)
Alkaline Phosphatase: 49 U/L (ref 40–150)
Anion Gap: 10 mEq/L (ref 3–11)
BILIRUBIN TOTAL: 0.34 mg/dL (ref 0.20–1.20)
BUN: 13.8 mg/dL (ref 7.0–26.0)
CHLORIDE: 99 meq/L (ref 98–109)
CO2: 30 meq/L — AB (ref 22–29)
CREATININE: 0.9 mg/dL (ref 0.7–1.3)
Calcium: 10.4 mg/dL (ref 8.4–10.4)
EGFR: 90 mL/min/{1.73_m2} — AB (ref >90)
GLUCOSE: 98 mg/dL (ref 70–140)
Potassium: 4.3 mEq/L (ref 3.5–5.1)
SODIUM: 139 meq/L (ref 136–145)
Total Protein: 7.4 g/dL (ref 6.4–8.3)

## 2016-10-17 LAB — CBC WITH DIFFERENTIAL/PLATELET
BASO%: 0.1 % (ref 0.0–2.0)
Basophils Absolute: 0 10*3/uL (ref 0.0–0.1)
EOS%: 0.8 % (ref 0.0–7.0)
Eosinophils Absolute: 0.1 10*3/uL (ref 0.0–0.5)
HCT: 38.2 % — ABNORMAL LOW (ref 38.4–49.9)
HGB: 12.8 g/dL — ABNORMAL LOW (ref 13.0–17.1)
LYMPH%: 12.5 % — AB (ref 14.0–49.0)
MCH: 30.5 pg (ref 27.2–33.4)
MCHC: 33.5 g/dL (ref 32.0–36.0)
MCV: 91.2 fL (ref 79.3–98.0)
MONO#: 0.5 10*3/uL (ref 0.1–0.9)
MONO%: 7 % (ref 0.0–14.0)
NEUT#: 5.8 10*3/uL (ref 1.5–6.5)
NEUT%: 79.6 % — ABNORMAL HIGH (ref 39.0–75.0)
Platelets: 240 10*3/uL (ref 140–400)
RBC: 4.19 10*6/uL — AB (ref 4.20–5.82)
RDW: 15.2 % — ABNORMAL HIGH (ref 11.0–14.6)
WBC: 7.3 10*3/uL (ref 4.0–10.3)
lymph#: 0.9 10*3/uL (ref 0.9–3.3)

## 2016-10-17 LAB — TSH: TSH: 0.234 m[IU]/L — AB (ref 0.320–4.118)

## 2016-10-18 DIAGNOSIS — R1310 Dysphagia, unspecified: Secondary | ICD-10-CM | POA: Insufficient documentation

## 2016-10-18 NOTE — Assessment & Plan Note (Signed)
He has persistent pain but has self discontinue most of his pain medicine except for liquid morphine sulfate I encouraged him to take morphine before he attempts to eat.

## 2016-10-18 NOTE — Progress Notes (Addendum)
[place] OFFICE PROGRESS NOTE  Patient Care Team: Patient, No Pcp Per as PCP - General (General Practice) Rozetta Nunnery, MD as Consulting Physician (Otolaryngology) Eppie Gibson, MD as Attending Physician (Radiation Oncology) Heath Lark, MD as Consulting Physician (Hematology and Oncology) Lenn Cal, DDS as Consulting Physician (Dentistry) Leota Sauers, RN as Oncology Nurse Navigator Karie Mainland, RD as Dietitian (Nutrition) Valentino Saxon [place], CCC-SLP as Speech Language Pathologist (Speech Pathology) Jomarie Longs, PT as Physical Therapist (Physical Therapy) Kennith Center, LCSW as Social Worker  SUMMARY OF ONCOLOGIC HISTORY:   Cancer of tonsillar fossa ([place])   06/27/2016 Imaging    Ct neck: Multiple malignant appearing lymph nodes in the left cervical chain as described, up to 23 mm in diameter. Possible early nodal disease in the right level 2 neck. Asymmetric fullness of the left tonsillar fossa, suspect squamous cell carcinoma primary.       06/28/2016 Pathology Results    Diagnosis Tonsil, biopsy, left - INVASIVE SQUAMOUS CELL CARCINOMA. - SEE COMMENT. Microscopic Comment The carcinoma appears moderately differentiated and is non-keratinizing. A p16 stain will be performed and the results reported separately The malignant cells are strongly and diffusely positive for p16.      07/05/2016 PET scan    1. Hypermetabolism in the region of the left palatini tonsil, consistent with known primary malignancy. 2. Hypermetabolic left-sided level II and III metastatic cervical lymphadenopathy. 3. 6 mm posterior left lower lobe pulmonary nodule. Attention on follow-up recommended as metastatic disease not excluded. 4. Cholelithiasis. 5. Nephrolithiasis. 6. Coronary artery and thoracoabdominal aortic atherosclerosis.      08/02/2016 Miscellaneous    Baseline hearing test is near normal      08/05/2016 Procedure    Fluoroscopic insertion  of a 20-French "pull-through" gastrostomy      08/05/2016 Procedure    Ultrasound and fluoroscopically guided right internal jugular single lumen power port catheter insertion. Tip in the SVC/RA junction. Catheter ready for use.      08/10/2016 - 09/29/2016 Radiation Therapy    Radiation treatment dates:   08/10/16 - 09/29/16  Site/dose:     Left Tonsil and bilateral neck / 70 Gy in 35 fractions to gross disease, 63 Gy in 35 fractions to high risk nodal echelons, and 56 Gy in 35 fractions to intermediate risk nodal echelons  Beams/energy:  IMRT / 6 MV photons      08/12/2016 - 09/09/2016 Chemotherapy    He only received 2 doses of cisplatin due to unbearable side-effects      09/02/2016 Adverse Reaction    Second dose was delayed by patient no feeling well       INTERVAL HISTORY: Please see below for problem oriented charting. He returns for further follow-up The patient has not been seen for a while because he declined follow-up appointment for a while He denies significant pain except when he tries to swallow He has self discontinued majority of his medications He is instilling 5-6 cans of nutritional supplement through his feeding tube He denies nausea, vomiting or constipation He complained of dysphagia but he thought that the dysphagia could be due to pain.  He has not attempted to swallow anything by mouth. He is not doing any exercises for his throat as suggested.  The patient avoided eye contact and make sinister comments to suggestions and felt that all these appointments are scheme to make more money out of him.  REVIEW OF SYSTEMS:   Constitutional: Denies fevers,  chills or abnormal weight loss Eyes: Denies blurriness of vision Respiratory: Denies cough, dyspnea or wheezes Cardiovascular: Denies palpitation, chest discomfort or lower extremity swelling Gastrointestinal:  Denies nausea, heartburn or change in bowel habits Skin: Denies abnormal skin rashes Lymphatics: Denies  new lymphadenopathy or easy bruising Neurological:Denies numbness, tingling or new weaknesses Behavioral/Psych: Mood is stable, no new changes  All other systems were reviewed with the patient and are negative.  I have reviewed the past medical history, past surgical history, social history and family history with the patient and they are unchanged from previous note.  ALLERGIES:  has No Known Allergies.  MEDICATIONS:  Current Outpatient Prescriptions  Medication Sig Dispense Refill  . acetaminophen (TYLENOL) 325 MG tablet Take 650 mg by mouth every 6 (six) hours as needed for mild pain or headache.     . Melatonin 10 MG TABS Take 10 mg by mouth at bedtime as needed (sleep).     . Morphine Sulfate (MORPHINE CONCENTRATE) 10 mg / 0.5 ml concentrated solution Take 0.5 mLs (10 mg total) by mouth every 2 (two) hours as needed for severe pain. 240 mL 0  . Multiple Vitamin (MULTIVITAMIN) tablet Take 1 tablet by mouth daily.    . Omega-3 Fatty Acids (FISH OIL OMEGA-3 PO) Take by mouth.    . triamcinolone (NASACORT) 55 MCG/ACT AERO nasal inhaler Place 2 sprays into the nose daily. 1 Inhaler 12  . VALERIAN ROOT PO Take 1 tablet by mouth daily.      Current Facility-Administered Medications  Medication Dose Route Frequency Provider Last Rate Last Dose  . ondansetron (ZOFRAN) 8 mg in sodium chloride 0.9 % 50 mL IVPB   Intravenous Once Alvy Bimler, Kinze Labo, MD        PHYSICAL EXAMINATION: ECOG PERFORMANCE STATUS: 1 - Symptomatic but completely ambulatory  Vitals:   10/17/16 1448  BP: (!) 156/85  Pulse: 100  Resp: 20  Temp: 98.8 F (37.1 C)   Filed Weights   10/17/16 1448  Weight: 176 lb (79.8 kg)    GENERAL:alert, no distress and comfortable SKIN: skin color, texture, turgor are normal, no rashes or significant lesions EYES: normal, Conjunctiva are pink and non-injected, sclera clear OROPHARYNX:no exudate, no erythema and lips, buccal mucosa, and tongue normal  NECK: supple, thyroid normal  size, non-tender, without nodularity LYMPH: He has palpable lymphadenopathy in the left supraclavicular region.  He has mild lymphedema around his neck  LUNGS: clear to auscultation and percussion with normal breathing effort HEART: regular rate & rhythm and no murmurs and no lower extremity edema ABDOMEN:abdomen soft, non-tender and normal bowel sounds Musculoskeletal:no cyanosis of digits and no clubbing  NEURO: alert & oriented x 3 with fluent speech, no focal motor/sensory deficits  LABORATORY DATA:  I have reviewed the data as listed    Component Value Date/Time   NA 139 10/17/2016 1437   K 4.3 10/17/2016 1437   CL 92 (L) 09/16/2016 2321   CO2 30 (H) 10/17/2016 1437   GLUCOSE 98 10/17/2016 1437   BUN 13.8 10/17/2016 1437   CREATININE 0.9 10/17/2016 1437   CALCIUM 10.4 10/17/2016 1437   PROT 7.4 10/17/2016 1437   ALBUMIN 3.6 10/17/2016 1437   AST 16 10/17/2016 1437   ALT 20 10/17/2016 1437   ALKPHOS 49 10/17/2016 1437   BILITOT 0.34 10/17/2016 1437   GFRNONAA >60 08/05/2016 1159   GFRAA >60 08/05/2016 1159    No results found for: SPEP, UPEP  Lab Results  Component Value Date   WBC  7.3 10/17/2016   NEUTROABS 5.8 10/17/2016   HGB 12.8 (L) 10/17/2016   HCT 38.2 (L) 10/17/2016   MCV 91.2 10/17/2016   PLT 240 10/17/2016      Chemistry      Component Value Date/Time   NA 139 10/17/2016 1437   K 4.3 10/17/2016 1437   CL 92 (L) 09/16/2016 2321   CO2 30 (H) 10/17/2016 1437   BUN 13.8 10/17/2016 1437   CREATININE 0.9 10/17/2016 1437      Component Value Date/Time   CALCIUM 10.4 10/17/2016 1437   ALKPHOS 49 10/17/2016 1437   AST 16 10/17/2016 1437   ALT 20 10/17/2016 1437   BILITOT 0.34 10/17/2016 1437      ASSESSMENT & PLAN:  Cancer of tonsillar fossa ([place]) Unfortunately, the patient continues to be resistant to suggestions. He had self discontinued most of his medications. The patient made comments that all these appointments are "tricks" to get more money  out of him. He does not seem to want to accept any suggestions to treat his symptoms. I recommend [place] evaluation and possibly referral back to see speech and language therapist for further assessment. I continue to encourage him to increase oral intake as tolerated. I will try to streamline his appointment and appointed the ENT navigator to help coordinate return appointment to see him on the same day of [facility] clinic or speech therapist evaluation  Mucositis due to antineoplastic therapy He has persistent pain but has self discontinue most of his pain medicine except for liquid morphine sulfate I encouraged him to take morphine before he attempts to eat.  Dysphagia He stated that he cannot eat because of pain and difficulties with swallowing I suggested morphine sulfate before he attempts to eat to treat the pain but I am concerned that he may have oropharyngeal dysphagia due to lack of use He is not doing all the exercises as suggested I suggest repeat assessment by speech and language therapist but the patient seems resistant to the idea I will get the ENT navigator to touch base again with him next week to see if he is willing to be referred  Supraclavicular adenopathy He has palpable left supraclavicular lymphadenopathy. Prior imaging studies show evidence of lymphadenopathy but it was not hypermetabolic His case will be presented at the next ENT tumor board for further discussion.  The patient is not symptomatic   No orders of the defined types were placed in this encounter.  All questions were answered. The patient knows to call the clinic with any problems, questions or concerns. No barriers to learning was detected. I spent 15 minutes counseling the patient face to face. The total time spent in the appointment was 20 minutes and more than 50% was on counseling and review of test results     Heath Lark, MD 10/19/2016 7:13 AM

## 2016-10-18 NOTE — Assessment & Plan Note (Signed)
Unfortunately, the patient continues to be resistant to suggestions. He had self discontinued most of his medications. The patient made comments that all these appointments are "tricks" to get more money out of him. He does not seem to want to accept any suggestions to treat his symptoms. I recommend [place] evaluation and possibly referral back to see speech and language therapist for further assessment. I continue to encourage him to increase oral intake as tolerated. I will try to streamline his appointment and appointed the ENT navigator to help coordinate return appointment to see him on the same day of [facility] clinic or speech therapist evaluation

## 2016-10-18 NOTE — Progress Notes (Addendum)
Oncology Nurse Navigator Documentation  Met with patient during Established Patient appt with Dr. Alvy Bimler.  He was accompanied by his wife.  He completed RT (6/28)/chemo (6/8) for L tonsil ISCC. He reported:  Some throat pain with swallowing, instilling 5-6 bottles nutritional supplement daily via PEG; taking morphine HS to help with sleep. Dr. Alvy Bimler encouraged taking morphine 30 minutes prior to mealtime to facilitate oral intake.  I reviewed criteria for PEG removal (100% oral intake, healthy weight maintenance).  Xerostomia.  I recommended tart lozenges, dill pickle spears, to encourage salivation; Biotene gel and coconut oil to moisten membranes.  Non-compliance with SLP HEP; I encouraged twice daily execution for first 6 months s/p RT.  He acknowledged he has not had follow-up with Glendell Docker, "don't see why it's important".  He also stated he did not want to pay the co-pay.  I re-educated on importance of conducting HEP and maintaining SLP follow-up to minimize dysphagia issues post-RT.    Still using Scopolamine patch.  Dr. Alvy Bimler recommended stopping use d/t his reported minimal/lack of nausea, noting may be contributing to xerostomia. He understood I will coordinate f/u with SLP and Dr. Alvy Bimler mid-late August. He voiced understanding of information provided. They understand to call me with needs/concerns.  Gayleen Orem, RN, BSN, [place] [facility] Oncology Nurse [place] at [place] [phone]

## 2016-10-18 NOTE — Assessment & Plan Note (Signed)
He stated that he cannot eat because of pain and difficulties with swallowing I suggested morphine sulfate before he attempts to eat to treat the pain but I am concerned that he may have oropharyngeal dysphagia due to lack of use He is not doing all the exercises as suggested I suggest repeat assessment by speech and language therapist but the patient seems resistant to the idea I will get the ENT navigator to touch base again with him next week to see if he is willing to be referred

## 2016-10-19 ENCOUNTER — Telehealth (HOSPITAL_COMMUNITY): Payer: Self-pay | Admitting: Dentistry

## 2016-10-19 DIAGNOSIS — R590 Localized enlarged lymph nodes: Secondary | ICD-10-CM | POA: Insufficient documentation

## 2016-10-19 NOTE — Assessment & Plan Note (Signed)
He has palpable left supraclavicular lymphadenopathy. Prior imaging studies show evidence of lymphadenopathy but it was not hypermetabolic His case will be presented at the next ENT tumor board for further discussion.  The patient is not symptomatic

## 2016-10-19 NOTE — Telephone Encounter (Signed)
10/19/16 Called and left msg. for pt. to Dental Medicine to schl. F/U appt. w/Dr. Enrique Sack.  LRI

## 2016-10-21 ENCOUNTER — Telehealth: Payer: Self-pay | Admitting: *Deleted

## 2016-10-21 NOTE — Telephone Encounter (Signed)
Oncology Nurse Navigator Documentation  LVMM for patient informing him of recommendation from [facility] H&N Conference/Dr. Alvy Bimler that he should have follow-up with ENT Dr. Lucia Gaskins to evaluate lower neck nodule, that he should be getting call from Dr. Pollie Friar office to schedule appt.  Encouraged him to call with questions.  Gayleen Orem, RN, BSN, [place] Neck Oncology Nurse [place] at [place] [phone]

## 2016-10-26 ENCOUNTER — Ambulatory Visit: Payer: Self-pay | Admitting: Otolaryngology

## 2016-10-26 ENCOUNTER — Telehealth: Payer: Self-pay | Admitting: *Deleted

## 2016-10-26 ENCOUNTER — Encounter (HOSPITAL_BASED_OUTPATIENT_CLINIC_OR_DEPARTMENT_OTHER): Payer: Self-pay | Admitting: *Deleted

## 2016-10-26 NOTE — H&P (Signed)
PREOPERATIVE H&P  Chief Complaint: left neck node  HPI: Douglas Norris is a 61 y.o. male who presents for evaluation of left neck node. 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 Recently Dr. Isidore Moos has noted a left level IV node or nodule just above the clavicle that is firm. He has a 1-1/2-2 cm low left neck nodule. He's taken to the operating room for excisional biopsy under local anesthetic.  Past Medical History:  Diagnosis Date  . Heart murmur    as a child  . History of radiation therapy 08/10/16- 09/29/16   Left Tonsil 70 Gy in 35 fractions to gross disease, 63 Gy in 35 fraction to high risk nodal echelons, and 56 Gy in 35 fraction to intermediate risk nodal echelons.   . 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 APPENDECTOMY  03/02/12  . IR FLUORO GUIDE PORT INSERTION RIGHT  08/05/2016  . IR GASTROSTOMY TUBE MOD SED  08/05/2016  . IR US GUIDE VASC ACCESS RIGHT  08/05/2016  . LAPAROSCOPIC APPENDECTOMY  03/02/2012   Procedure: APPENDECTOMY LAPAROSCOPIC;  Surgeon: Joyice Faster. Cornett, MD;  Location: [facility];  Service: General;  Laterality: N/A;  . WISDOM TOOTH EXTRACTION     lower molars bilat   Social History   Social History  . Marital status: Married    Spouse name: N/A  . Number of children: 0  . Years of education: N/A   Occupational History  . unemployed    Social History Main Topics  . Smoking status: Former Smoker    Quit date: 07/04/2003  . Smokeless tobacco: Never Used  . Alcohol use No     Comment: Quit 2005  . Drug use: No  . Sexual activity: Yes     Comment: i partner in last 12 months   Other Topics Concern  . None   Social History Narrative  . None   Family History  Problem Relation Age of Onset  . Cancer Mother        breast  . Cancer Father        pancreatic  . Cancer Sister         breast  . Cancer Sister        breast   No Known Allergies Prior to Admission medications   Medication Sig Start Date End Date Taking? Authorizing Provider  acetaminophen (TYLENOL) 325 MG tablet Take 650 mg by mouth every 6 (six) hours as needed for mild pain or headache.     [provider]  Melatonin 10 MG TABS Take 10 mg by mouth at bedtime as needed (sleep).     [provider]  Morphine Sulfate (MORPHINE CONCENTRATE) 10 mg / 0.5 ml concentrated solution Take 0.5 mLs (10 mg total) by mouth every 2 (two) hours as needed for severe pain. 08/25/16   Heath Lark, MD  Multiple Vitamin (MULTIVITAMIN) tablet Take 1 tablet by mouth daily.    [provider]  Omega-3 Fatty Acids (FISH OIL OMEGA-3 PO) Take by mouth.    [provider]  triamcinolone (NASACORT) 55 MCG/ACT AERO nasal inhaler Place 2 sprays into the nose daily. 09/15/16   Heath Lark, MD  VALERIAN ROOT PO Take 1 tablet by mouth daily.     [provider]     Positive ROS: Negative  All other  systems have been reviewed and were otherwise negative with the exception of those mentioned in the HPI and as above.  Physical Exam: There were no vitals filed for this visit.  General: Alert, no acute distress Oral: Normal oral mucosa and tonsils Nasal: Clear nasal passages Neck: 1-2 cm left low neck nodule which is firm to palpation or thyroid nodules Ear: Ear canal is clear with normal appearing TMs Cardiovascular: Regular rate and rhythm, no murmur.  Respiratory: Clear to auscultation Neurologic: Alert and oriented x 3   Assessment/Plan: LOCALIZED ENLARGED LYMPH NODE Plan for Procedure(s): EXCISION DEEP CERVICAL NODE   Melony Overly, MD 10/26/2016 12:34 PM

## 2016-10-26 NOTE — Telephone Encounter (Signed)
Oncology Nurse Navigator Documentation  Received call from Dr. Lucia Gaskins.  He saw Mr. Douglas Norris last week Thursday per previous request, has scheduled excisional bx of Level IV palpable mass for next Tuesday, 7/31.  He understands I will request copy of last week's note from his office.  Drs. Alvy Bimler and Jenkins informed.  Gayleen Orem, RN, BSN, [place] [facility]ck Oncology Nurse [place] at [place] [phone]

## 2016-11-01 ENCOUNTER — Ambulatory Visit (HOSPITAL_BASED_OUTPATIENT_CLINIC_OR_DEPARTMENT_OTHER)
Admission: RE | Admit: 2016-11-01 | Discharge: 2016-11-01 | Disposition: A | Discharge status: Home / Self Care | Payer: BLUE CROSS/BLUE SHIELD | Source: Ambulatory Visit | Attending: Otolaryngology | Admitting: Otolaryngology

## 2016-11-01 ENCOUNTER — Encounter (HOSPITAL_BASED_OUTPATIENT_CLINIC_OR_DEPARTMENT_OTHER): Payer: Self-pay | Admitting: *Deleted

## 2016-11-01 ENCOUNTER — Encounter (HOSPITAL_BASED_OUTPATIENT_CLINIC_OR_DEPARTMENT_OTHER)
Admission: RE | Disposition: A | Discharge status: Home / Self Care | Payer: Self-pay | Source: Ambulatory Visit | Attending: Otolaryngology

## 2016-11-01 DIAGNOSIS — R590 Localized enlarged lymph nodes: Secondary | ICD-10-CM | POA: Diagnosis present

## 2016-11-01 DIAGNOSIS — Z9889 Other specified postprocedural states: Secondary | ICD-10-CM | POA: Diagnosis not present

## 2016-11-01 DIAGNOSIS — Z923 Personal history of irradiation: Secondary | ICD-10-CM | POA: Insufficient documentation

## 2016-11-01 DIAGNOSIS — Z800 Family history of malignant neoplasm of digestive organs: Secondary | ICD-10-CM | POA: Diagnosis not present

## 2016-11-01 DIAGNOSIS — Z79891 Long term (current) use of opiate analgesic: Secondary | ICD-10-CM | POA: Diagnosis not present

## 2016-11-01 DIAGNOSIS — Z803 Family history of malignant neoplasm of breast: Secondary | ICD-10-CM | POA: Diagnosis not present

## 2016-11-01 DIAGNOSIS — Z9049 Acquired absence of other specified parts of digestive tract: Secondary | ICD-10-CM | POA: Insufficient documentation

## 2016-11-01 DIAGNOSIS — Z87891 Personal history of nicotine dependence: Secondary | ICD-10-CM | POA: Insufficient documentation

## 2016-11-01 DIAGNOSIS — Z85818 Personal history of malignant neoplasm of other sites of lip, oral cavity, and pharynx: Secondary | ICD-10-CM | POA: Insufficient documentation

## 2016-11-01 HISTORY — PX: MASS EXCISION: SHX2000

## 2016-11-01 SURGERY — MINOR EXCISION OF MASS
Anesthesia: LOCAL | Site: Neck | Laterality: Left

## 2016-11-01 MED ORDER — CHLORHEXIDINE GLUCONATE CLOTH 2 % EX PADS: 6.0000 | MEDICATED_PAD | Freq: Once | CUTANEOUS | Status: DC

## 2016-11-01 MED ORDER — LIDOCAINE-EPINEPHRINE 1 %-1:100000 IJ SOLN
INTRAMUSCULAR | Status: AC
  Filled 2016-11-01: qty 1

## 2016-11-01 MED ORDER — SILVER NITRATE-POT NITRATE 75-25 % EX MISC
CUTANEOUS | Status: AC
  Filled 2016-11-01: qty 1

## 2016-11-01 MED ORDER — BACITRACIN ZINC 500 UNIT/GM EX OINT
TOPICAL_OINTMENT | CUTANEOUS | Status: AC
  Filled 2016-11-01: qty 28.35

## 2016-11-01 MED ORDER — LIDOCAINE-EPINEPHRINE 1 %-1:100000 IJ SOLN
INTRAMUSCULAR | Status: DC | PRN
  Administered 2016-11-01: 4 mL

## 2016-11-01 SURGICAL SUPPLY — 48 items
BANDAGE ADH SHEER 1  50/CT (GAUZE/BANDAGES/DRESSINGS) IMPLANT
BENZOIN TINCTURE PRP APPL 2/3 (GAUZE/BANDAGES/DRESSINGS) IMPLANT
BLADE SURG 15 STRL LF DISP TIS (BLADE) ×1 IMPLANT
BLADE SURG 15 STRL SS (BLADE) ×1
CANISTER SUCT 1200ML W/VALVE (MISCELLANEOUS) ×2 IMPLANT
CAUTERY EYE LOW TEMP 1300F FIN (OPHTHALMIC RELATED) IMPLANT
CLEANER CAUTERY TIP 5X5 PAD (MISCELLANEOUS) IMPLANT
COTTONBALL LRG STERILE PKG (GAUZE/BANDAGES/DRESSINGS) IMPLANT
DECANTER SPIKE VIAL GLASS SM (MISCELLANEOUS) IMPLANT
DRSG TEGADERM 4X4.75 (GAUZE/BANDAGES/DRESSINGS) IMPLANT
ELECT COATED BLADE 2.86 ST (ELECTRODE) ×2 IMPLANT
ELECT REM PT RETURN 9FT ADLT (ELECTROSURGICAL) ×2
ELECTRODE REM PT RTRN 9FT ADLT (ELECTROSURGICAL) ×1 IMPLANT
GAUZE SPONGE 4X4 16PLY XRAY LF (GAUZE/BANDAGES/DRESSINGS) IMPLANT
GLOVE BIO SURGEON STRL SZ 6.5 (GLOVE) ×4 IMPLANT
GLOVE SS BIOGEL STRL SZ 7.5 (GLOVE) ×1 IMPLANT
GLOVE SUPERSENSE BIOGEL SZ 7.5 (GLOVE) ×1
GOWN STRL REUS W/ TWL LRG LVL3 (GOWN DISPOSABLE) ×2 IMPLANT
GOWN STRL REUS W/TWL LRG LVL3 (GOWN DISPOSABLE) ×2
MARKER SKIN DUAL TIP RULER LAB (MISCELLANEOUS) IMPLANT
NEEDLE PRECISIONGLIDE 27X1.5 (NEEDLE) ×2 IMPLANT
NS IRRIG 1000ML POUR BTL (IV SOLUTION) IMPLANT
PACK BASIN DAY SURGERY FS (CUSTOM PROCEDURE TRAY) IMPLANT
PAD CLEANER CAUTERY TIP 5X5 (MISCELLANEOUS)
PENCIL BUTTON HOLSTER BLD 10FT (ELECTRODE) ×2 IMPLANT
SPONGE INTESTINAL PEANUT (DISPOSABLE) IMPLANT
STRIP CLOSURE SKIN 1/2X4 (GAUZE/BANDAGES/DRESSINGS) IMPLANT
STRIP CLOSURE SKIN 1/4X4 (GAUZE/BANDAGES/DRESSINGS) IMPLANT
SUCTION FRAZIER HANDLE 10FR (MISCELLANEOUS) ×1
SUCTION TUBE FRAZIER 10FR DISP (MISCELLANEOUS) ×1 IMPLANT
SUT CHROMIC 3 0 PS 2 (SUTURE) IMPLANT
SUT CHROMIC 4 0 P 3 18 (SUTURE) ×2 IMPLANT
SUT ETHILON 5 0 P 3 18 (SUTURE)
SUT ETHILON 6 0 P 1 (SUTURE) IMPLANT
SUT NYLON ETHILON 5-0 P-3 1X18 (SUTURE) IMPLANT
SUT PLAIN 5 0 P 3 18 (SUTURE) IMPLANT
SUT SILK 4 0 TIES 17X18 (SUTURE) ×2 IMPLANT
SUT VIC AB 4-0 P-3 18XBRD (SUTURE) IMPLANT
SUT VIC AB 4-0 P3 18 (SUTURE)
SWAB COLLECTION DEVICE MRSA (MISCELLANEOUS) IMPLANT
SWAB CULTURE ESWAB REG 1ML (MISCELLANEOUS) IMPLANT
SWABSTICK POVIDONE IODINE SNGL (MISCELLANEOUS) ×4 IMPLANT
SYR BULB 3OZ (MISCELLANEOUS) ×2 IMPLANT
SYR CONTROL 10ML LL (SYRINGE) ×2 IMPLANT
TOWEL OR 17X24 6PK STRL BLUE (TOWEL DISPOSABLE) ×2 IMPLANT
TRAY DSU PREP LF (CUSTOM PROCEDURE TRAY) IMPLANT
TUBE CONNECTING 20X1/4 (TUBING) IMPLANT
YANKAUER SUCT BULB TIP NO VENT (SUCTIONS) IMPLANT

## 2016-11-01 NOTE — Discharge Instructions (Signed)
Keep incision site dry for next 24 hrs. May then get it wet. Tylenol or motrin prn pain Return to Dr Pollie Friar office Friday at 4:30 for recheck and review pathology

## 2016-11-01 NOTE — Op Note (Signed)
NAME:  Douglas Norris, Douglas Norris                  ACCOUNT NO.:  MEDICAL RECORD NO.:  [id number]  LOCATION:                                 FACILITY:  PHYSICIAN:  Leonides Sake. Lucia Gaskins, M.D.DATE OF BIRTH:  Aug 20, 1955  DATE OF PROCEDURE:  11/01/2016 DATE OF DISCHARGE:                              OPERATIVE REPORT   PREOPERATIVE DIAGNOSIS:  Lower left neck nodes.  POSTOPERATIVE DIAGNOSIS:  Lower left neck nodes.  OPERATION PERFORMED:  Excision of lower left neck nodes/mass.  SURGEON:  Leonides Sake. Lucia Gaskins, M.D.  ANESTHESIA:  Local 1% Xylocaine with 1:100,000 epinephrine, 5 mL.  COMPLICATIONS:  None.  BRIEF CLINICAL NOTE:  Douglas Norris is a 61 year old gentleman, who had recently completed radiation therapy for a left tonsil cancer that was metastatic to the left neck nodes 4 months ago.  His postop PET scan was negative; however, on recent evaluation, he has had a firm nodule in the lower left neck level 4 nodes that is firm to palpation.  It is fairly prominent and measures 1.5 to 2 cm in size.  Because this was not noticeable prior to the PET scan, it was felt that this would best be evaluated with excisional biopsy.  He is taken to the operating room at this time for excision of left lower neck mass under local anesthetic.  DESCRIPTION OF PROCEDURE:  The area was palpated, was firm, measuring approximately 1.5 to 2 cm in size in the lower left neck.  The area was injected with approximately 5 mL of Xylocaine with epinephrine for local anesthetic.  The incision site was marked out and then an incision was made directly over the mass down through the subcutaneous tissue and the platysma muscle.  This represented really indurated fat tissue and some small lymph nodes.  On evaluation of this, surrounding fat pad and small lymph nodes were excised as a unit.  Hemostasis was obtained with bipolar cautery and a 4-0 silk ligature around a larger vein.  After obtaining adequate hemostasis,  the wound was irrigated with saline. Wound was closed with 3-0 chromic sutures and the subcutaneous tissue with 5-0 Vicryl in a subcuticular stitch followed by Dermabond.  The patient tolerated this well and is discharged home later this morning.  DISPOSITION:  The patient is discharged home later this morning on Tylenol, Motrin, and hydrocodone p.r.n. pain.  Follow up in my office in 3 to 4 days for review of pathology.          ______________________________ Leonides Sake Lucia Gaskins, M.D.     CEN/MEDQ  D:  11/01/2016  T:  11/01/2016  Job:  [id number]  cc:   Leonides Sake. Lucia Gaskins, M.D.

## 2016-11-01 NOTE — Interval H&P Note (Signed)
History and Physical Interval Note:  11/01/2016 8:30 AM  Douglas Norris  has presented today for surgery, with the diagnosis of LOCALIZED ENLARGED LYMPH NODE  The various methods of treatment have been discussed with the patient and family. After consideration of risks, benefits and other options for treatment, the patient has consented to  Procedure(s): EXCISION DEEP CERVICAL NODE (N/A) as a surgical intervention .  The patient's history has been reviewed, patient examined, no change in status, stable for surgery.  I have reviewed the patient's chart and labs.  Questions were answered to the patient's satisfaction.     Kenny Stern

## 2016-11-01 NOTE — Brief Op Note (Signed)
11/01/2016  9:49 AM  PATIENT:  Douglas Norris  61 y.o. male  PRE-OPERATIVE DIAGNOSIS:  LOCALIZED ENLARGED LYMPH NODE LEFT NECK  POST-OPERATIVE DIAGNOSIS:  LOCALIZED ENLARGED LYMPH NODE LEFT NECK  PROCEDURE:  Procedure(s): EXCISION DEEP CERVICAL NODE (Left)  SURGEON:  Surgeon(s) and Role:    Rozetta Nunnery, MD - Primary  PHYSICIAN ASSISTANT:   ASSISTANTS: none   ANESTHESIA:   local  EBL:  No intake/output data recorded.  BLOOD ADMINISTERED:none  DRAINS: none   LOCAL MEDICATIONS USED:  XYLOCAINE with EPI 5 cc  SPECIMEN:  Source of Specimen:  left lower neck nodes  DISPOSITION OF SPECIMEN:  PATHOLOGY  COUNTS:  YES  TOURNIQUET:  * No tourniquets in log *  DICTATION: .Other Dictation: Dictation Number [id number]  PLAN OF CARE: Discharge to home after PACU  PATIENT DISPOSITION:  PACU - hemodynamically stable.   Delay start of Pharmacological VTE agent (>24hrs) due to surgical blood loss or risk of bleeding: not applicable

## 2016-11-02 ENCOUNTER — Encounter (HOSPITAL_BASED_OUTPATIENT_CLINIC_OR_DEPARTMENT_OTHER): Payer: Self-pay | Admitting: Otolaryngology

## 2016-11-02 ENCOUNTER — Telehealth: Payer: Self-pay | Admitting: *Deleted

## 2016-11-02 NOTE — Telephone Encounter (Signed)
Oncology Nurse Navigator Documentation  Received call from ENT Dr. Radene Journey indicating 11/01/16 bx of cervical lymph node of concern was benign.   Drs. Alvy Bimler and Isidore Moos provided update.  Gayleen Orem, RN, BSN, [place] [facility] Oncology Nurse [place] at [place] [phone]

## 2016-11-04 NOTE — Telephone Encounter (Signed)
Hi Douglas Norris, Pls call patient to clarify what he wants in terms of future follow-up. He is currently not scheduled to return. He needs port flush, MDC follow-up and see me. However, if he does not wish to see me, I will wait for update from you or Dr. Isidore Moos related to timing of port and PEG removal

## 2016-11-07 ENCOUNTER — Telehealth: Payer: Self-pay | Admitting: *Deleted

## 2016-11-07 NOTE — Telephone Encounter (Signed)
Oncology Nurse Navigator Documentation  Spoke with Mr. Tiffany Kocher. He stated:  Will continue follow-up with Dr. Alvy Bimler as recommended.  Understanding PAC flush will be scheduled.  To his question, I explained PAC will be removed pending results of restaging PET which will scheduled late October.  Denied offer to attend 8/14 H&N [place] for follow-up with SLP and Nutrition who he hasn't seen since initial visits during 4/24 H&N [place], "I'm swallowing fine, it will get better as the pain goes away."  I stressed importance of these follow-ups during post-tmt phase.  Maintaining weight at 172 lbs.  Using PEG for 4-5 bottles nutritional supplement daily.  He voiced understanding PEG can be removed when he reaches 100% oral intake and weight is stable. He understands to call me with needs/concerns, should he change his mind about [place] attendance.  Dr. Alvy Bimler, Dory Peru RD, Garald Balding SLP provided update.  Gayleen Orem, RN, BSN, [place] Neck Oncology Nurse [place] at [place] [phone]

## 2016-11-08 ENCOUNTER — Telehealth: Payer: Self-pay | Admitting: Hematology and Oncology

## 2016-11-08 ENCOUNTER — Telehealth: Payer: Self-pay | Admitting: *Deleted

## 2016-11-08 NOTE — Telephone Encounter (Signed)
Patient called and said that he got a call about an appointment with Dr Alvy Bimler and we wanted to cancel the appointment and does not want to see her at this time. Please do not schedule appointments with Dr Alvy Bimler for patient

## 2016-11-08 NOTE — Telephone Encounter (Signed)
Oncology Nurse Navigator Documentation  Received call from Douglas Norris indicating Mr. Tiffany Kocher called to cancel 8/17 1:30 flush, 1400 follow-up with Dr. Alvy Bimler.  Appts were scheduled per my conversation with him yesterday.  I LVMM asking for call-back to clarify his wishes.  Additionally, I offered to arrange appt with [facility] to address  frustrations reported yesterday re insurance coverage, balance dues.  I asked him to include in his call back interest in such a meeting.  Gayleen Orem, RN, BSN, [place] Neck Oncology Nurse [place] at [place] [phone]

## 2016-11-08 NOTE — Telephone Encounter (Signed)
Left a voicemail regarding his upcoming appointment on the 18th.

## 2016-11-10 ENCOUNTER — Telehealth: Payer: Self-pay | Admitting: *Deleted

## 2016-11-10 NOTE — Telephone Encounter (Signed)
Oncology Nurse Navigator Documentation  LVMM for Mr. Tiffany Kocher in follow-up to my call yesterday requesting call-back to clarify his interest in follow-up with Dr. Alvy Bimler and [facility] flush scheduled for next week, offer to meet with Financial Counseling to review billing, insurance coverage.  I indicated appts will be cancelled if call-back not received by tomorrow.  Gayleen Orem, RN, BSN, [place] [facility]ck Oncology Nurse [place] at [place] [phone]

## 2016-11-18 ENCOUNTER — Ambulatory Visit: Payer: Self-pay | Admitting: Hematology and Oncology

## 2016-12-23 NOTE — Progress Notes (Signed)
error 

## 2016-12-27 ENCOUNTER — Telehealth: Payer: Self-pay | Admitting: *Deleted

## 2016-12-27 NOTE — Telephone Encounter (Signed)
Oncology Nurse Navigator Documentation  Received call from Mr. Douglas Norris with request for removal of PEG and PAC.  He indicated using PEG for instillation of 1-2 bottles of Boost "because it's easy" though he is eating and drinking.  Noted he is maintaining weight ca 175 lbs.  I indicated PAC is usually not removed until after restaging PET in the event additional chemotherapy may be warranted.  He replied "I'll never get chemo again."  He noted further his PAC has not been flushed for 3 months.  He further stated he does not plan to have PET (scheduled for 9/29) because insurance has not covered expense of initial PET.  (He has [employer].)  I explained the importance of restaging scan to determine tmt efficacy.  He adamantly replied he will not have it.  He noted further he continues to receive bills from [facility] for payment on services [employer] has not covered. I indicated I would ask [facility] to check his account and try to determine problems with insurance coverage.  (I offered this assistance 11/10/16 but he did not confirm interest.)  Dr. Alvy Bimler notified of PEG and [facility] removal request, Dr. Isidore Moos updated.    Gayleen Orem, RN, BSN, [place] [facility]ck Oncology Nurse [place] at [place] [phone]

## 2016-12-27 NOTE — Telephone Encounter (Signed)
Called patient to inform of Pet Scan for 12-31-16 - arrival time - 7:30 am @ [facility], pt. to be npo- after midnight, lvm for a return call

## 2016-12-28 ENCOUNTER — Telehealth: Payer: Self-pay | Admitting: *Deleted

## 2016-12-28 ENCOUNTER — Other Ambulatory Visit: Payer: Self-pay | Admitting: Hematology and Oncology

## 2016-12-28 DIAGNOSIS — C090 Malignant neoplasm of tonsillar fossa: Secondary | ICD-10-CM

## 2016-12-28 NOTE — Telephone Encounter (Signed)
Oncology Nurse Navigator Documentation  Spoke with Mr. Douglas Norris, informed him:  Dr. Alvy Bimler has placed orders for PEG and PAC removal, he can expect call from [facility] to schedule appt.  [facility] [employer] Specialist is pursuing approval for re-staging PET.  He voiced willingness to move forward with re-scheduling of PET and follow-up with Dr. Isidore Moos when approved. We discussed near future follow-up with [employer] regarding previous PET denial.  He expressed interest.  Gayleen Orem, RN, BSN, [place] at [place] [phone]

## 2016-12-30 ENCOUNTER — Ambulatory Visit
Admission: RE | Admit: 2016-12-30 | Discharge: 2016-12-30 | Disposition: A | Discharge status: Home / Self Care | Payer: BLUE CROSS/BLUE SHIELD | Source: Ambulatory Visit | Attending: Radiation Oncology | Admitting: Radiation Oncology

## 2016-12-31 ENCOUNTER — Encounter (HOSPITAL_COMMUNITY): Payer: BLUE CROSS/BLUE SHIELD

## 2017-01-02 ENCOUNTER — Other Ambulatory Visit: Payer: Self-pay | Admitting: Radiology

## 2017-01-03 ENCOUNTER — Ambulatory Visit (HOSPITAL_COMMUNITY)
Admission: RE | Admit: 2017-01-03 | Discharge: 2017-01-03 | Disposition: A | Discharge status: Home / Self Care | Payer: BLUE CROSS/BLUE SHIELD | Source: Ambulatory Visit | Attending: Hematology and Oncology | Admitting: Hematology and Oncology

## 2017-01-03 ENCOUNTER — Encounter (HOSPITAL_COMMUNITY): Payer: Self-pay

## 2017-01-03 DIAGNOSIS — Z452 Encounter for adjustment and management of vascular access device: Secondary | ICD-10-CM | POA: Insufficient documentation

## 2017-01-03 DIAGNOSIS — Z923 Personal history of irradiation: Secondary | ICD-10-CM | POA: Diagnosis not present

## 2017-01-03 DIAGNOSIS — Z85818 Personal history of malignant neoplasm of other sites of lip, oral cavity, and pharynx: Secondary | ICD-10-CM | POA: Diagnosis not present

## 2017-01-03 DIAGNOSIS — Z431 Encounter for attention to gastrostomy: Secondary | ICD-10-CM | POA: Insufficient documentation

## 2017-01-03 DIAGNOSIS — Z7951 Long term (current) use of inhaled steroids: Secondary | ICD-10-CM | POA: Diagnosis not present

## 2017-01-03 DIAGNOSIS — C090 Malignant neoplasm of tonsillar fossa: Secondary | ICD-10-CM

## 2017-01-03 HISTORY — PX: IR GASTROSTOMY TUBE REMOVAL: IMG5492

## 2017-01-03 HISTORY — PX: IR REMOVAL TUN ACCESS W/ PORT W/O FL MOD SED: IMG2290

## 2017-01-03 LAB — CBC WITH DIFFERENTIAL/PLATELET
BASOS ABS: 0 10*3/uL (ref 0.0–0.1)
BASOS PCT: 0 %
Eosinophils Absolute: 0.1 10*3/uL (ref 0.0–0.7)
Eosinophils Relative: 2 %
HEMATOCRIT: 39.6 % (ref 39.0–52.0)
HEMOGLOBIN: 13.9 g/dL (ref 13.0–17.0)
Lymphocytes Relative: 15 %
Lymphs Abs: 1 10*3/uL (ref 0.7–4.0)
MCH: 31.7 pg (ref 26.0–34.0)
MCHC: 35.1 g/dL (ref 30.0–36.0)
MCV: 90.4 fL (ref 78.0–100.0)
Monocytes Absolute: 0.3 10*3/uL (ref 0.1–1.0)
Monocytes Relative: 4 %
NEUTROS ABS: 5.3 10*3/uL (ref 1.7–7.7)
NEUTROS PCT: 79 %
Platelets: 269 10*3/uL (ref 150–400)
RBC: 4.38 MIL/uL (ref 4.22–5.81)
RDW: 12.5 % (ref 11.5–15.5)
WBC: 6.8 10*3/uL (ref 4.0–10.5)

## 2017-01-03 MED ORDER — HYDROCODONE-ACETAMINOPHEN 5-325 MG PO TABS: 1.0000 | ORAL_TABLET | ORAL | Status: DC | PRN

## 2017-01-03 MED ORDER — SODIUM CHLORIDE 0.9 % IV SOLN
INTRAVENOUS | Status: DC
  Administered 2017-01-03: 12:00:00 via INTRAVENOUS

## 2017-01-03 MED ORDER — MIDAZOLAM HCL 2 MG/2ML IJ SOLN
INTRAMUSCULAR | Status: AC
  Filled 2017-01-03: qty 4

## 2017-01-03 MED ORDER — LIDOCAINE HCL 1 % IJ SOLN
INTRAMUSCULAR | Status: AC
  Filled 2017-01-03: qty 20

## 2017-01-03 MED ORDER — FENTANYL CITRATE (PF) 100 MCG/2ML IJ SOLN
INTRAMUSCULAR | Status: AC | PRN
  Administered 2017-01-03: 50 ug via INTRAVENOUS

## 2017-01-03 MED ORDER — LIDOCAINE VISCOUS 2 % MT SOLN
OROMUCOSAL | Status: AC
  Filled 2017-01-03: qty 15

## 2017-01-03 MED ORDER — FENTANYL CITRATE (PF) 100 MCG/2ML IJ SOLN
INTRAMUSCULAR | Status: AC
  Filled 2017-01-03: qty 4

## 2017-01-03 MED ORDER — CEFAZOLIN SODIUM-DEXTROSE 2-4 GM/100ML-% IV SOLN
INTRAVENOUS | Status: AC
  Filled 2017-01-03: qty 100

## 2017-01-03 MED ORDER — LIDOCAINE VISCOUS 2 % MT SOLN
OROMUCOSAL | Status: DC | PRN
  Administered 2017-01-03: 15 mL via OROMUCOSAL

## 2017-01-03 MED ORDER — CEFAZOLIN SODIUM-DEXTROSE 2-4 GM/100ML-% IV SOLN
2.0000 g | INTRAVENOUS | Status: AC
  Administered 2017-01-03: 2 g via INTRAVENOUS

## 2017-01-03 MED ORDER — MIDAZOLAM HCL 2 MG/2ML IJ SOLN
INTRAMUSCULAR | Status: AC | PRN
  Administered 2017-01-03 (×2): 1 mg via INTRAVENOUS

## 2017-01-03 NOTE — Sedation Documentation (Signed)
Patient denies pain and is resting comfortably.  

## 2017-01-03 NOTE — Procedures (Signed)
Interventional Radiology Procedure Note  Procedure: Port and gastrostomy tube removal   Complications: None  Estimated Blood Loss: < 10 mL  Port removed in entirety from right chest.  Wound closed. Bumper retention gastrostomy tube removed with manual traction.  Venetia Night. Kathlene Cote, M.D Pager:  [phone]

## 2017-01-03 NOTE — Discharge Instructions (Signed)
Moderate Conscious Sedation, Adult, Care After °These instructions provide you with information about caring for yourself after your procedure. Your health care provider may also give you more specific instructions. Your treatment has been planned according to current medical practices, but problems sometimes occur. Call your health care provider if you have any problems or questions after your procedure. °What can I expect after the procedure? °After your procedure, it is common: °· To feel sleepy for several hours. °· To feel clumsy and have poor balance for several hours. °· To have poor judgment for several hours. °· To vomit if you eat too soon. ° °Follow these instructions at home: °For at least 24 hours after the procedure: ° °· Do not: °? Participate in activities where you could fall or become injured. °? Drive. °? Use heavy machinery. °? Drink alcohol. °? Take sleeping pills or medicines that cause drowsiness. °? Make important decisions or sign legal documents. °? Take care of children on your own. °· Rest. °Eating and drinking °· Follow the diet recommended by your health care provider. °· If you vomit: °? Drink water, juice, or soup when you can drink without vomiting. °? Make sure you have little or no nausea before eating solid foods. °General instructions °· Have a responsible adult stay with you until you are awake and alert. °· Take over-the-counter and prescription medicines only as told by your health care provider. °· If you smoke, do not smoke without supervision. °· Keep all follow-up visits as told by your health care provider. This is important. °Contact a health care provider if: °· You keep feeling nauseous or you keep vomiting. °· You feel light-headed. °· You develop a rash. °· You have a fever. °Get help right away if: °· You have trouble breathing. °This information is not intended to replace advice given to you by your health care provider. Make sure you discuss any questions you have  with your health care provider. °Document Released: 01/09/2013 Document Revised: 08/24/2015 Document Reviewed: 07/11/2015 °Elsevier Interactive Patient Education © 2018 Else[employer] Inc[employer] ° ° ° ° °Implanted Port Removal, Care After °Refer to this sheet in the next few weeks. These instructions provide you with information about caring for yourself after your procedure. Your health care provider may also give you more specific instructions. Your treatment has been planned according to current medical practices, but problems sometimes occur. Call your health care provider if you have any problems or questions after your procedure. °What can I expect after the procedure? °After the procedure, it is common to have: °· Soreness or pain near your incision. °· Some swelling or bruising near your incision. ° °Follow these instructions at home: °Medicines °· Take over-the-counter and prescription medicines only as told by your health care provider. °· If you were prescribed an antibiotic medicine, take it as told by your health care provider. Do not stop taking the antibiotic even if you start to feel better. °Bathing °· Do not take baths, swim, or use a hot tub until your health care provider approves. Ask your health care provider if you can take showers. You may only be allowed to take sponge baths for bathing. °Incision care °· Follow instructions from your health care provider about how to take care of your incision. Make sure you: °? Wash your hands with soap and water before you change your bandage (dressing). If soap and water are not available, use hand sanitizer. °? Change your dressing as told by your health care provider. °?   Keep your dressing dry. °? Leave stitches (sutures), skin glue, or adhesive strips in place. These skin closures may need to stay in place for 2 weeks or longer. If adhesive strip edges start to loosen and curl up, you may trim the loose edges. Do not remove adhesive strips completely unless your  health care provider tells you to do that. °· Check your incision area every day for signs of infection. Check for: °? More redness, swelling, or pain. °? More fluid or blood. °? Warmth. °? Pus or a bad smell. °Driving °· If you received a sedative, do not drive for 24 hours after the procedure. °· If you did not receive a sedative, ask your health care provider when it is safe to drive. °Activity °· Return to your normal activities as told by your health care provider. Ask your health care provider what activities are safe for you. °· Until your health care provider says it is safe: °? Do not lift anything that is heavier than 10 lb (4.5 kg). °? Do not do activities that involve lifting your arms over your head. °General instructions °· Do not use any tobacco products, such as cigarettes, chewing tobacco, and e-cigarettes. Tobacco can delay healing. If you need help quitting, ask your health care provider. °· Keep all follow-up visits as told by your health care provider. This is important. °Contact a health care provider if: °· You have more redness, swelling, or pain around your incision. °· You have more fluid or blood coming from your incision. °· Your incision feels warm to the touch. °· You have pus or a bad smell coming from your incision. °· You have a fever. °· You have pain that is not relieved by your pain medicine. °Get help right away if: °· You have chest pain. °· You have difficulty breathing. °This information is not intended to replace advice given to you by your health care provider. Make sure you discuss any questions you have with your health care provider. °Document Released: 03/02/2015 Document Revised: 08/27/2015 Document Reviewed: 12/24/2014 °Elsevier Interactive Patient Education © 2018 Elsevier Inc. ° °

## 2017-01-03 NOTE — H&P (Signed)
Referring Physician(s): Heath Lark  Supervising Physician: Aletta Edouard  Patient Status:  [facility]  Chief Complaint:  "I'm getting my port and G tube out"  Subjective: Patient familiar to IR service from prior Port-A-Cath and gastrostomy tube placements on 08/05/16. 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 He is currently eating a regular diet. He presents today for Port-A-Cath and gastrostomy tube removal. He currently denies fever, headache, chest pain, dyspnea, cough, significant abdominal pain, back pain, nausea, vomiting or bleeding. Past Medical History:  Diagnosis Date  . Heart murmur    as a child  . History of radiation therapy 08/10/16- 09/29/16   Left Tonsil 70 Gy in 35 fractions to gross disease, 63 Gy in 35 fraction to high risk nodal echelons, and 56 Gy in 35 fraction to intermediate risk nodal echelons.   . 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 APPENDECTOMY  03/02/12  . IR FLUORO GUIDE PORT INSERTION RIGHT  08/05/2016  . IR GASTROSTOMY TUBE MOD SED  08/05/2016  . IR US GUIDE VASC ACCESS RIGHT  08/05/2016  . LAPAROSCOPIC APPENDECTOMY  03/02/2012   Procedure: APPENDECTOMY LAPAROSCOPIC;  Surgeon: Joyice Faster. Cornett, MD;  Location: [facility];  Service: General;  Laterality: N/A;  . MASS EXCISION Left 11/01/2016   Procedure: EXCISION DEEP CERVICAL NODE;  Surgeon: Rozetta Nunnery, MD;  Location: [facility];  Service: ENT;  Laterality: Left;  . WISDOM TOOTH EXTRACTION     lower molars bilat      Allergies: Patient has no known allergies.  Medications: Prior to Admission medications   Medication Sig Start Date End Date Taking? Authorizing Provider  acetaminophen (TYLENOL) 325 MG tablet Take 650 mg by mouth every 6 (six) hours as needed for mild pain or headache.     [provider]  Melatonin 10 MG TABS Take 10 mg by mouth at  bedtime as needed (sleep).     [provider]  Morphine Sulfate (MORPHINE CONCENTRATE) 10 mg / 0.5 ml concentrated solution Take 0.5 mLs (10 mg total) by mouth every 2 (two) hours as needed for severe pain. 08/25/16   Heath Lark, MD  Multiple Vitamin (MULTIVITAMIN) tablet Take 1 tablet by mouth daily.    [provider]  Omega-3 Fatty Acids (FISH OIL OMEGA-3 PO) Take by mouth.    [provider]  triamcinolone (NASACORT) 55 MCG/ACT AERO nasal inhaler Place 2 sprays into the nose daily. 09/15/16   Heath Lark, MD  VALERIAN ROOT PO Take 1 tablet by mouth daily.     [provider]     Vital Signs:blood pressure 130/89, heart rate 104, respirations 18, temp 98.4, O2 sat 98% room air   Physical Exam awake, alert. Chest clear to auscultation bilaterally. Clean, intact right chest wall Port-A-Cath. Heart with regular rate and rhythm. Abdomen soft, positive bowel sounds, clean, intact gastrostomy tube; no lower extremity edema.  Imaging: No results found.  Labs:  CBC:  Recent Labs  08/31/16 1430 09/07/16 1444 09/14/16 1424 09/16/16 2321 10/17/16 1437  WBC 3.2* 5.1 4.9  --  7.3  HGB 14.0 13.9 13.8 12.9* 12.8*  HCT 41.9 41.3 39.5 38.0* 38.2*  PLT 158 210 290  --  240    COAGS:  Recent Labs  08/05/16 1159  INR 0.93    BMP:  Recent Labs  06/17/16 1507  08/05/16 1159  08/31/16  1430 09/07/16 1444 09/14/16 1424 09/16/16 2321 10/17/16 1437  NA 140  --  140  < > 139 137 137 130* 139  K 4.1  --  3.9  < > 4.1 4.4 3.6 3.6 4.3  CL 98  --  105  --   --   --   --  92*  --   CO2 24  --  25  < > 27 27 28   --  30*  GLUCOSE 86  --  98  < > 121 85 111 115* 98  BUN 19  < > 24*  < > 22.5 19.5 25.6 26* 13.8  CALCIUM 9.3  --  9.3  < > 9.4 9.9 9.7  --  10.4  CREATININE 0.88  < > 0.88  < > 0.9 0.9 1.2 1.00 0.9  GFRNONAA 95  --  >60  --   --   --   --   --   --   GFRAA 109  --  >60  --   --   --   --   --   --   < > = values in this interval not  displayed.  LIVER FUNCTION TESTS:  Recent Labs  08/31/16 1430 09/07/16 1444 09/14/16 1424 10/17/16 1437  BILITOT <0.22 0.38 0.45 0.34  AST 14 18 28 16   ALT 16 26 52 20  ALKPHOS 51 42 42 49  PROT 7.2 7.5 7.3 7.4  ALBUMIN 3.6 3.8 3.7 3.6    Assessment and Plan: Patient with prior history of squamous cell carcinoma of the tonsil, status post completion of treatment. He is currently eating a regular diet. He presents today for both Port-A-Cath and gastrostomy tube removals. Details/risks of procedures, including but not limited to, internal bleeding, infection, injury to adjacent structures, discussed with patient with his understanding and consent.   Electronically Signed: D. Rowe Robert, PA-C 01/03/2017, 12:04 PM   I spent a total of 20 minutes at the the patient's bedside AND on the patient's hospital floor or unit, greater than 50% of which was counseling/coordinating care for Port-A-Cath and gastrostomy tube removals

## 2017-01-04 ENCOUNTER — Telehealth: Payer: Self-pay | Admitting: *Deleted

## 2017-01-04 NOTE — Telephone Encounter (Signed)
Oncology Nurse Navigator Documentation  Spoke with Mr. Douglas Norris, informed him Dr . Isidore Moos had peer-to-peer with [facility] MD, obtained authorization for re-staging PET to be received by week's end.  He agreed to move forward with PET and follow-up with Dr. Isidore Moos.  He voiced understanding he will be contacted to arrange appts.  Gayleen Orem, RN, BSN, [place] Neck Oncology Nurse [place] at [place] [phone]

## 2017-01-04 NOTE — Progress Notes (Signed)
  Mr. Douglas Norris presents for follow up of radiation completed 09/29/16 to his Left Tonsil.   Pain issues, if any:  Using a feeding tube?: removed 01/03/17 Weight changes, if any:  Swallowing issues, if any:  Smoking or chewing tobacco?  Using fluoride trays daily?  Last ENT visit was on: Dr. Lucia Gaskins Other notable issues, if any:  Declining future appointments with Dr. Alvy Bimler  PET

## 2017-01-05 ENCOUNTER — Telehealth: Payer: Self-pay | Admitting: *Deleted

## 2017-01-05 NOTE — Telephone Encounter (Signed)
CALLED PATIENT TO INFORM THAT FU HAS BEEN CANCELED FOR 01-06-17, DUE TO PET NOT BEING DONE, LVM FOR A RETURN CALL

## 2017-01-05 NOTE — Telephone Encounter (Signed)
Oncology Nurse Navigator Documentation  Received call from Mr. Douglas Norris.  He received notification from [facility] that his 07/05/16 initial PET will be covered.  [place] notified.  Gayleen Orem, RN, BSN, [place] Neck Oncology Nurse [place] at [place] [phone]

## 2017-01-05 NOTE — Telephone Encounter (Signed)
CALLED PATIENT TO INFORM OF PET SCAN FOR 01-13-17- ARRIVAL TIME - 6:30 AM @ [facility], PT. TO BE NPO- AFTER MIDNIGHT, FU APPT. WITH DR. Isidore Moos ON 01-18-17 @ 11 AM FOR RESULTS, LVM, FOR A RETURN CALL

## 2017-01-06 ENCOUNTER — Inpatient Hospital Stay
Admission: RE | Admit: 2017-01-06 | Discharge: 2017-01-06 | Disposition: A | Discharge status: Home / Self Care | Payer: BLUE CROSS/BLUE SHIELD | Source: Ambulatory Visit | Attending: Radiation Oncology | Admitting: Radiation Oncology

## 2017-01-11 NOTE — Progress Notes (Signed)
  Mr. Douglas Norris presents for follow up of radiation completed 09/29/16 to his Left Tonsil.   Pain issues, if any: He denies pain.  Using a feeding tube?:no, removed, two weeks ago Weight changes, if any:  Wt Readings from Last 3 Encounters:  01/18/17 172 lb 3.2 oz (78.1 kg)  10/26/16 176 lb (79.8 kg)  10/17/16 176 lb (79.8 kg)    Swallowing issues, if any: He continues to have taste changes. 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 He need to moisten his food to swallow.  Smoking or chewing tobacco? No Using fluoride trays daily? No Last ENT visit was on: Dr. Lucia Gaskins not in several months since biopsy 11/01/16 Other notable issues, if any:  Declining future appointments with Dr. Alvy Bimler  BP (!) 113/95   Pulse 91   Temp 97.8 F (36.6 C)   Ht 5\' 8"  (1.727 m)   Wt 172 lb 3.2 oz (78.1 kg)   SpO2 98% Comment: room air  BMI 26.18 kg/m   PET 01/13/17

## 2017-01-13 ENCOUNTER — Ambulatory Visit (HOSPITAL_COMMUNITY)
Admission: RE | Admit: 2017-01-13 | Discharge: 2017-01-13 | Disposition: A | Discharge status: Home / Self Care | Payer: BLUE CROSS/BLUE SHIELD | Source: Ambulatory Visit | Attending: Radiation Oncology | Admitting: Radiation Oncology

## 2017-01-13 DIAGNOSIS — K8020 Calculus of gallbladder without cholecystitis without obstruction: Secondary | ICD-10-CM | POA: Insufficient documentation

## 2017-01-13 DIAGNOSIS — R911 Solitary pulmonary nodule: Secondary | ICD-10-CM | POA: Insufficient documentation

## 2017-01-13 DIAGNOSIS — N200 Calculus of kidney: Secondary | ICD-10-CM | POA: Insufficient documentation

## 2017-01-13 DIAGNOSIS — C090 Malignant neoplasm of tonsillar fossa: Secondary | ICD-10-CM | POA: Diagnosis present

## 2017-01-13 DIAGNOSIS — I700 Atherosclerosis of aorta: Secondary | ICD-10-CM | POA: Insufficient documentation

## 2017-01-13 DIAGNOSIS — C770 Secondary and unspecified malignant neoplasm of lymph nodes of head, face and neck: Secondary | ICD-10-CM | POA: Diagnosis not present

## 2017-01-13 LAB — GLUCOSE, CAPILLARY: Glucose-Capillary: 89 mg/dL (ref 65–99)

## 2017-01-13 MED ORDER — FLUDEOXYGLUCOSE F - 18 (FDG) INJECTION
8.7000 | Freq: Once | INTRAVENOUS | Status: AC | PRN
  Administered 2017-01-13: 8.7 via INTRAVENOUS

## 2017-01-18 ENCOUNTER — Ambulatory Visit
Admission: RE | Admit: 2017-01-18 | Discharge: 2017-01-18 | Disposition: A | Discharge status: Home / Self Care | Payer: BLUE CROSS/BLUE SHIELD | Source: Ambulatory Visit | Attending: Radiation Oncology | Admitting: Radiation Oncology

## 2017-01-18 ENCOUNTER — Telehealth: Payer: Self-pay | Admitting: Physician Assistant

## 2017-01-18 ENCOUNTER — Telehealth: Payer: Self-pay | Admitting: *Deleted

## 2017-01-18 ENCOUNTER — Encounter: Payer: Self-pay | Admitting: Radiation Oncology

## 2017-01-18 ENCOUNTER — Encounter: Payer: Self-pay | Admitting: *Deleted

## 2017-01-18 DIAGNOSIS — Z809 Family history of malignant neoplasm, unspecified: Secondary | ICD-10-CM | POA: Diagnosis not present

## 2017-01-18 DIAGNOSIS — C090 Malignant neoplasm of tonsillar fossa: Secondary | ICD-10-CM

## 2017-01-18 DIAGNOSIS — Z79899 Other long term (current) drug therapy: Secondary | ICD-10-CM | POA: Insufficient documentation

## 2017-01-18 DIAGNOSIS — C770 Secondary and unspecified malignant neoplasm of lymph nodes of head, face and neck: Secondary | ICD-10-CM | POA: Insufficient documentation

## 2017-01-18 DIAGNOSIS — Z510 Encounter for antineoplastic radiation therapy: Secondary | ICD-10-CM | POA: Diagnosis not present

## 2017-01-18 DIAGNOSIS — Z87891 Personal history of nicotine dependence: Secondary | ICD-10-CM | POA: Diagnosis not present

## 2017-01-18 DIAGNOSIS — R509 Fever, unspecified: Secondary | ICD-10-CM | POA: Insufficient documentation

## 2017-01-18 NOTE — Progress Notes (Signed)
Radiation Oncology         [phone] ________________________________  Name: Douglas Norris MRN: [id number]  Date: 01/18/2017  DOB: [birth year]  Follow-Up  Visit Note  Outpatient  CC: Tereasa Coop, PA-C  Rozetta Nunnery, *  Diagnosis and Prior Radiotherapy:    ICD-10-CM   1. Cancer of tonsillar fossa (HCC) C09.0    61 y.o. 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  CHIEF COMPLAINT: Here for follow-up and surveillance of invasive squamous cell carcinoma of left tonsil cancer.  Narrative:  The patient returns today for routine follow-up. Discussed with patient the complete response on PET scan as reviewed at tumor board this AM. Douglas Norris completed radiation on 09/29/16 to his left tonsil. Patient seems to be better. No pain. Swallowing well.  PEG removed.  Dry mouth. Not smoking. Not using fluoride tries.  Not seeing his PCP.                              ALLERGIES:  has No Known Allergies.  Meds: Current Outpatient Prescriptions  Medication Sig Dispense Refill  . acetaminophen (TYLENOL) 325 MG tablet Take 650 mg by mouth every 6 (six) hours as needed for mild pain or headache.     . Melatonin 10 MG TABS Take 10 mg by mouth at bedtime as needed (sleep).     . Multiple Vitamin (MULTIVITAMIN) tablet Take 1 tablet by mouth daily.    . Omega-3 Fatty Acids (FISH OIL OMEGA-3 PO) Take by mouth.    Marland Kitchen VALERIAN ROOT PO Take 1 tablet by mouth daily.     . Morphine Sulfate (MORPHINE CONCENTRATE) 10 mg / 0.5 ml concentrated solution Take 0.5 mLs (10 mg total) by mouth every 2 (two) hours as needed for severe pain. (Patient not taking: Reported on 01/18/2017) 240 mL 0  . triamcinolone (NASACORT) 55 MCG/ACT AERO nasal inhaler Place 2 sprays into the nose daily. (Patient not taking: Reported on 01/18/2017) 1 Inhaler 12   Current Facility-Administered Medications  Medication Dose Route Frequency Provider Last Rate  Last Dose  . ondansetron (ZOFRAN) 8 mg in sodium chloride 0.9 % 50 mL IVPB   Intravenous Once Heath Lark, MD        Physical Findings: The patient is in no acute distress. Patient is alert and oriented.  height is 5\' 8"  (1.727 m) and weight is 172 lb 3.2 oz (78.1 kg). His temperature is 97.8 F (36.6 C). His blood pressure is 113/95 (abnormal) and his pulse is 91. His oxygen saturation is 98%. .     General: Alert and oriented, in no acute distress HEENT: Head is normocephalic. Extraocular movements are intact. Oropharynx is clear. Neck: Neck is supple, no palpable cervical or supraclavicular lymphadenopathy. Mild lymphedema.  Heart: Regular in rate and rhythm with no murmurs, rubs, or gallops. Chest: Clear to auscultation bilaterally, with no rhonchi, wheezes, or rales. Abdomen: Soft, nontender, nondistended, with no rigidity or guarding. Extremities: No cyanosis or edema. Lymphatics: see Neck Exam Skin: No concerning lesions. Musculoskeletal: symmetric strength and muscle tone throughout. Neurologic: Cranial nerves II through XII are grossly intact. No obvious focalities. Speech is fluent. Coordination is intact. Psychiatric: Judgment and insight are intact. Affect is appropriate.     Lab Findings: Lab Results  Component Value Date   WBC 6.8 01/03/2017   HGB 13.9 01/03/2017   HCT 39.6 01/03/2017  MCV 90.4 01/03/2017   PLT 269 01/03/2017   Lab Results  Component Value Date   TSH 0.234 (L) 10/17/2016    Radiographic Findings: Nm Pet Image Restag (ps) Skull Base To Thigh  Result Date: 01/13/2017 CLINICAL DATA:  Subsequent treatment strategy for tonsillar cancer. EXAM: NUCLEAR MEDICINE PET SKULL BASE TO THIGH TECHNIQUE: 8.7 mCi F-18 FDG was injected intravenously. Full-ring PET imaging was performed from the skull base to thigh after the radiotracer. CT data was obtained and used for attenuation correction and anatomic localization. FASTING BLOOD GLUCOSE:  Value: 89 mg/dl  COMPARISON:  07/05/2016 FINDINGS: NECK: Symmetric uptake in the mandible bilaterally at the level of the unerupted posterior most molars. The left tonsillar activity seen on the prior study has resolved completely in the interval as has the mass-effect in the left oropharynx. The left cervical hypermetabolic lymph nodes have decreased in the interval. 5 mm short axis level II left cervical lymph node (image 29 series 4) has decreased from 20 mm on the prior study. Low level FDG accumulation persists within this lymph node. 5 mm short axis left-sided level II lymph node identified on image 30 series 4 demonstrates SUV max = 3.5. The hypermetabolic level III left-sided lymph nodes seen on the prior study shows no hypermetabolism on today's exam and has decreased in size from 8 mm short axis previously to 3 mm short axis today. CHEST: No hypermetabolic mediastinal or hilar nodes. No suspicious pulmonary nodules on the CT scan. The 6 mm nodule in the posterior left costophrenic sulcus on the prior study measures 5 mm today. ABDOMEN/PELVIS: No abnormal hypermetabolic activity within the liver, pancreas, adrenal glands, or spleen. No hypermetabolic lymph nodes in the abdomen or pelvis. Tiny calcified gallstones evident. 3 mm nonobstructing left renal stone is associated with a 1-2 mm nonobstructing stone in the lower pole the right kidney. There is abdominal aortic atherosclerosis without aneurysm. Diverticular changes noted left colon without diverticulitis. Bilateral groin hernias contain only fat. SKELETON: No focal hypermetabolic activity to suggest skeletal metastasis. IMPRESSION: 1. Clear interval response to therapy with resolution of the left tonsillar activity and marked decrease in hypermetabolic left cervical lymph node metastases. 2. No change posterior left lower lobe pulmonary nodule. Continued attention on follow-up recommended. 3. Cholelithiasis. 4. Bilateral nonobstructing renal stones. 5.  Aortic  Atherosclerois (ICD10-170.0) Electronically Signed   By: Misty Stanley M.D.   On: 01/13/2017 10:32   Ir Removal [employer] W/ [place] W/o Fl Mod Sed  Result Date: 01/03/2017 CLINICAL DATA:  Tonsillar carcinoma and status post completion of therapy. Prior Port-A-Cath and gastrostomy tube placement on 08/05/2016. EXAM: 1. REMOVAL OF IMPLANTED TUNNELED PORT-A-CATH WITHOUT FLUOROSCOPY 2. REMOVAL OF PERCUTANEOUS GASTROSTOMY TUBE MEDICATIONS: Moderate (conscious) sedation was employed during this procedure. A total of Versed 2.0 mg and Fentanyl 50 mcg was administered intravenously. Moderate Sedation Time: 20 minutes. The patient's level of consciousness and vital signs were monitored continuously by radiology nursing throughout the procedure under my direct supervision. 2 g IV Ancef PROCEDURE: The right chest Port-A-Cath site was prepped with chlorhexidine. A sterile gown and gloves were worn during the procedure. Local anesthesia was provided with 1% lidocaine. An incision was made overlying the Port-A-Cath with a #15 scalpel. Utilizing sharp and blunt dissection, the Port-A-Cath was removed. Portable cautery was utilized. Retention sutures were removed. The pocked was irrigated with sterile saline. Wound closure was performed with subcutaneous 3-0 Monocryl, subcuticular 4-0 Vicryl and Dermabond. The entire Port-A-Cath was removed successfully. Viscous lidocaine was  injected within the gastrostomy tube tract. The tube was then removed utilizing manual traction. The entire gastrostomy tube was removed. IMPRESSION: 1. Removal of implanted Port-A-Cath utilizing sharp and blunt dissection. The procedure was uncomplicated. 2. Removal of indwelling percutaneous gastrostomy tube with manual traction. Electronically Signed   By: Aletta Edouard M.D.   On: 01/03/2017 16:27   Ir Gastrostomy Tube Removal  Result Date: 01/03/2017 CLINICAL DATA:  Tonsillar carcinoma and status post completion of therapy. Prior Port-A-Cath and  gastrostomy tube placement on 08/05/2016. EXAM: 1. REMOVAL OF IMPLANTED TUNNELED PORT-A-CATH WITHOUT FLUOROSCOPY 2. REMOVAL OF PERCUTANEOUS GASTROSTOMY TUBE MEDICATIONS: Moderate (conscious) sedation was employed during this procedure. A total of Versed 2.0 mg and Fentanyl 50 mcg was administered intravenously. Moderate Sedation Time: 20 minutes. The patient's level of consciousness and vital signs were monitored continuously by radiology nursing throughout the procedure under my direct supervision. 2 g IV Ancef PROCEDURE: The right chest Port-A-Cath site was prepped with chlorhexidine. A sterile gown and gloves were worn during the procedure. Local anesthesia was provided with 1% lidocaine. An incision was made overlying the Port-A-Cath with a #15 scalpel. Utilizing sharp and blunt dissection, the Port-A-Cath was removed. Portable cautery was utilized. Retention sutures were removed. The pocked was irrigated with sterile saline. Wound closure was performed with subcutaneous 3-0 Monocryl, subcuticular 4-0 Vicryl and Dermabond. The entire Port-A-Cath was removed successfully. Viscous lidocaine was injected within the gastrostomy tube tract. The tube was then removed utilizing manual traction. The entire gastrostomy tube was removed. IMPRESSION: 1. Removal of implanted Port-A-Cath utilizing sharp and blunt dissection. The procedure was uncomplicated. 2. Removal of indwelling percutaneous gastrostomy tube with manual traction. Electronically Signed   By: Aletta Edouard M.D.   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 Recommendation was to re scan his chest in April to monitor benign appearing nodule.   Recommended Douglas Norris to see primary care physician to have TSH checked yearly as he is at somewhat heighted risk of Hypothyroidism later in life.   Recommended he see Dr. Enrique Sack to obtain fluoride trays -- patient may have lost his?  He needs close dental followup.  Recommended patient followup with ENT and med/onc per standard protocol.  I'll see him PRN.  Gayleen Orem, RN, our Head and Neck Oncology Navigator will help facilitate the appts above and  patient understands the importance of following my advice for best outcomes.  _____________________________________   Eppie Gibson, MD  This document serves as a record of services personally performed by Eppie Gibson MD. It was created on her behalf by Delton Coombes, a trained medical scribe. The creation of this record is based on the scribe's personal observations and the provider's statements to them. This document has been checked and approved by the attending provider.

## 2017-01-18 NOTE — Telephone Encounter (Signed)
Oncology Nurse Navigator Documentation  In follow-up to patient appt with Dr. Isidore Moos earlier today, called Primary Care at [facility], spoke with Stanton Kidney, requested patient be contacted for post-tmt follow-up with PCP Philis Fendt.  She voiced understanding.  Gayleen Orem, RN, BSN, [place] Neck Oncology Nurse [place] at [place] [phone]

## 2017-01-18 NOTE — Progress Notes (Signed)
Oncology Nurse Navigator Documentation  Joined Mr. Douglas Norris and his wife during f/u appt with Dr. Isidore Moos including discussion of 10/12 PET. Pt voiced understanding he will be referred back to [place] to be fit for fluoride trays. He was encouraged to reestablish with PCP, PA Philis Fendt, for wellness follow-up, including annual TSH (last test 10/17/16), understood I will call to initiate. He expressed appreciation for assistance in obtaining insurance approval for initial and re-staging PETs.    Gayleen Orem, RN, BSN, [place] Neck Oncology Nurse [place] at [place] [phone]

## 2017-01-18 NOTE — Telephone Encounter (Signed)
THIS MESSAGE IS FROM DR. Wynona Meals (NAVIGATOR ONCOLOGIST AT [place]) FOR MICHAEL CLARK: HE STATES THE PATIENT FINISHED UP HIS TREATMENT FOR ORAL (L) TONSIL CANCER BACK IN June. HE HAD HIS TSH CHECKED LAST ON 10/17/16. HE SAID MICHAEL SHOULD BE ABLE TO FOLLOW-UP WITH EVERYTHING THAT HAS HAPPENED THROUGH THE CARE EVERY WHERE. HE WOULD LIKE MR. Washabaugh TO RECONNECT WITH MICHAEL FOR HIS PRIMARY CARE. (I WILL CALL THE PATIENT TO SCHEDULE AN OFFICE VISIT). BEST PHONE IF QUESTIONS [phone] - DR. Wynona Meals. [place]

## 2017-01-18 NOTE — Telephone Encounter (Signed)
FYI

## 2017-01-19 ENCOUNTER — Telehealth: Payer: Self-pay | Admitting: *Deleted

## 2017-01-19 NOTE — Telephone Encounter (Signed)
I CALLED [name] [facility]. (01/18/17) TO MAKE AN APPOINTMENT WITH Douglas Norris PER DR. DIEHL'S INSTRUCTIONS. I HAD TO LEAVE HIM A MESSAGE ON HIS VOICE MAIL. I CALLED HIM AGAIN TODAY AND HE ANSWERED. HE DOES NOT WANT TO MAKE AN APPOINTMENT NOW. HE EXPRESSED THAT "HE HAS SO MANY BILLS AND NO TIME." AND "THAT HE CAN NOT AFFORD TO GO TO SO MANY DOCTORS JUST TO HAVE THEM CHECK HIM OUT AND CONTINUE TO GET MORE BILLS." BEST PHONE IF QUESTIONS [phone] (CELL) MBC

## 2017-01-19 NOTE — Telephone Encounter (Signed)
Oncology Nurse Navigator Documentation  LVMM for pt, relayed [facility] Dr. Ritta Slot recommendation for him to follow up with his primary Dentist for fabrication of fluoride trays and Rx for fluoride. Alternatively he can get his primary Dentist to write a prescription for Prevident 5000 Plus. He applies to toothbrush and brushes teeth for 2 minutes at bedtime.  I encouraged call-back if he has any questions.  Gayleen Orem, RN, BSN, [place] Neck Oncology Nurse [place] at [place] [phone]

## 2017-01-20 ENCOUNTER — Other Ambulatory Visit: Payer: Self-pay | Admitting: Radiation Oncology

## 2017-01-20 DIAGNOSIS — C090 Malignant neoplasm of tonsillar fossa: Secondary | ICD-10-CM

## 2017-01-20 DIAGNOSIS — R918 Other nonspecific abnormal finding of lung field: Secondary | ICD-10-CM

## 2017-01-23 ENCOUNTER — Telehealth: Payer: Self-pay | Admitting: *Deleted

## 2017-01-23 NOTE — Telephone Encounter (Signed)
Oncology Nurse Navigator Documentation  Per Dr. Pearlie Oyster guidance, called ENT Dr. Pollie Friar office to arrange appt.   Spoke with Marlowe Kays, requested patient be contacted and appt arranged for routine post-tm follow-up with Dr. Lucia Gaskins in 3 months.  She verbalized understanding.  Gayleen Orem, RN, BSN, [place] Neck Oncology Nurse [place] at [place] [phone]

## 2017-06-06 IMAGING — US IR FLUORO GUIDE CV LINE*R*
1 series · 1 of 1 positions shown · non-contrast
Comparison: none

CLINICAL DATA: Squamous cell tonsillar carcinoma

[Series 1: ir (id) (id)/(id)/(id) ir · 1 of 1 slices shown]
[im 1/1]
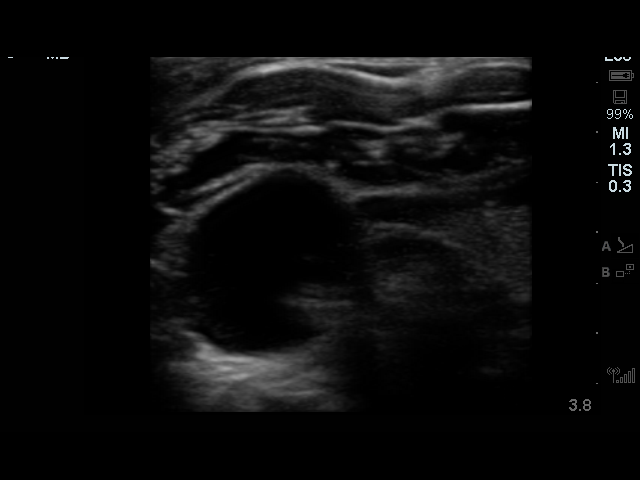

[1 of 1 positions shown; findings below may reference images not displayed]

EXAM:
RIGHT INTERNAL JUGULAR SINGLE LUMEN POWER PORT CATHETER INSERTION

Date:  [DATE] [DATE]

Radiologist:  China, Lari

Guidance:  Ultrasound and fluoroscopic

MEDICATIONS:
Ancef 2 g; The antibiotic was administered within an appropriate
time interval prior to skin puncture.

ANESTHESIA/SEDATION:
Versed 4 mg IV; Fentanyl 75 mcg IV;

Moderate Sedation Time:  35 minutes

The patient was continuously monitored during the procedure by the
interventional radiology nurse under my direct supervision.

FLUOROSCOPY TIME:  24 seconds (4 mGy)

COMPLICATIONS:
None immediate.

CONTRAST:  None.

PROCEDURE:
Informed consent was obtained from the patient following explanation
of the procedure, risks, benefits and alternatives. The patient
understands, agrees and consents for the procedure. All questions
were addressed. A time out was performed.

Maximal barrier sterile technique utilized including caps, mask,
sterile gowns, sterile gloves, large sterile drape, hand hygiene,
and 2% chlorhexidine scrub.

Under sterile conditions and local anesthesia, right internal
jugular micropuncture venous access was performed. Access was
performed with ultrasound. Images were obtained for documentation. A
guide wire was inserted followed by a transitional dilator. This
allowed insertion of a guide wire and catheter into the IVC.
Measurements were obtained from the SVC / RA junction back to the
right IJ venotomy site. In the right infraclavicular chest, a
subcutaneous pocket was created over the second anterior rib. This
was done under sterile conditions and local anesthesia. 1% lidocaine
with epinephrine was utilized for this. A 2.5 cm incision was made
in the skin. Blunt dissection was performed to create a subcutaneous
pocket over the right pectoralis major muscle. The pocket was
flushed with saline vigorously. There was adequate hemostasis. The
port catheter was assembled and checked for leakage. The port
catheter was secured in the pocket with two retention sutures. The
tubing was tunneled subcutaneously to the right venotomy site and
inserted into the SVC/RA junction through a valved peel-away sheath.
Position was confirmed with fluoroscopy. Images were obtained for
documentation. The patient tolerated the procedure well. No
immediate complications. Incisions were closed in a two layer
fashion with 4 - 0 Vicryl suture. Dermabond was applied to the skin.
The port catheter was accessed, blood was aspirated followed by
saline and heparin flushes. Needle was removed. A dry sterile
dressing was applied.
IMPRESSION: Ultrasound and fluoroscopically guided right internal jugular single
lumen power port catheter insertion. Tip in the SVC/RA junction.
Catheter ready for use.

## 2017-07-19 NOTE — Progress Notes (Signed)
error 

## 2017-07-21 ENCOUNTER — Telehealth: Payer: Self-pay | Admitting: Radiation Oncology

## 2017-07-21 NOTE — Telephone Encounter (Signed)
Patient called to reschedule radiation

## 2017-07-28 ENCOUNTER — Inpatient Hospital Stay
Admission: RE | Admit: 2017-07-28 | Discharge: 2017-07-28 | Disposition: A | Discharge status: Home / Self Care | Payer: Self-pay | Source: Ambulatory Visit | Attending: Radiation Oncology | Admitting: Radiation Oncology

## 2018-01-16 NOTE — Therapy (Signed)
[facility] [address] [place], [place], [address] Phone: [phone]   Fax:  [phone]  Patient Details  Name: Douglas Norris MRN: [id number] Date of Birth: 11/23/1955 Referring Provider:  Eppie Gibson, MD  Encounter Date: 01/16/2018  SPEECH THERAPY DISCHARGE SUMMARY  Visits from Start of Care: 1 (eval)  Current functional level related to goals / functional outcomes: Pt did not schedule/was not scheduled for follow up appointments after initial eval in 2018. Goals at that time were:  SLP Short Term Goals - 07/27/16 1733              SLP SHORT TERM GOAL #1    Title pt will complete HEP with min A over two sessions    Time 2    Period --  visits    Status New         SLP SHORT TERM GOAL #2    Title pt will tell SLP why he is completing HEP     Time 1    Period --  visits    Status New         SLP SHORT TERM GOAL #3    Title pt will tell SLP 3 overt s/s aspiration PNA with modified independence    Time 2    Period --  visits    Status New                       SLP Long Term Goals - 07/27/16 1734              SLP LONG TERM GOAL #1    Title pt will complete HEP with modified independence over two sessions    Time 4    Period --  visits    Status New         SLP LONG TERM GOAL #2    Title pt will tell SLP when HEP frequency can be reduced to x2-3/week    Time 4    Period --  visits    Status New         SLP LONG TERM GOAL #3    Title pt will tell SLP why he is completing HEP over four sessions    Time 6    Period --  visits    Status New         SLP LONG TERM GOAL #4    Title pt will tell SLP 3 overt s/s aspiration PNA over three sessions    Time 6    Period --  visits    Status New         Remaining deficits: Assumed some level of dysphagia remains.   Education / Equipment: HEP, late effects head/neck radiation on swallow ability.  Plan: Patient agrees to  discharge.  Patient goals were not met. Patient is being discharged due to not returning since the last visit.  ????? (pleased with current functional level?)       [facility] ,MS, CCC-SLP  01/16/2018, 2:32 PM  [facility] [address] [place], [place], [address] Phone: [phone]   Fax:  [phone]

## 2018-08-07 ENCOUNTER — Encounter (HOSPITAL_BASED_OUTPATIENT_CLINIC_OR_DEPARTMENT_OTHER): Payer: Managed Care, Other (non HMO) | Attending: Internal Medicine

## 2018-08-07 DIAGNOSIS — Y842 Radiological procedure and radiotherapy as the cause of abnormal reaction of the patient, or of later complication, without mention of misadventure at the time of the procedure: Secondary | ICD-10-CM | POA: Diagnosis not present

## 2018-08-07 DIAGNOSIS — Z87891 Personal history of nicotine dependence: Secondary | ICD-10-CM | POA: Diagnosis not present

## 2018-08-07 DIAGNOSIS — M272 Inflammatory conditions of jaws: Secondary | ICD-10-CM | POA: Diagnosis not present

## 2018-08-07 DIAGNOSIS — Z8589 Personal history of malignant neoplasm of other organs and systems: Secondary | ICD-10-CM | POA: Diagnosis not present

## 2018-08-08 ENCOUNTER — Other Ambulatory Visit: Payer: Self-pay

## 2018-08-08 ENCOUNTER — Ambulatory Visit
Admission: RE | Admit: 2018-08-08 | Discharge: 2018-08-08 | Disposition: A | Discharge status: Home / Self Care | Payer: Managed Care, Other (non HMO) | Source: Ambulatory Visit | Attending: Internal Medicine | Admitting: Internal Medicine

## 2018-08-08 DIAGNOSIS — Z01818 Encounter for other preprocedural examination: Secondary | ICD-10-CM

## 2018-08-14 DIAGNOSIS — M272 Inflammatory conditions of jaws: Secondary | ICD-10-CM | POA: Diagnosis not present

## 2018-08-15 DIAGNOSIS — M272 Inflammatory conditions of jaws: Secondary | ICD-10-CM | POA: Diagnosis not present

## 2018-08-16 DIAGNOSIS — M272 Inflammatory conditions of jaws: Secondary | ICD-10-CM | POA: Diagnosis not present

## 2018-08-17 DIAGNOSIS — M272 Inflammatory conditions of jaws: Secondary | ICD-10-CM | POA: Diagnosis not present

## 2018-08-20 DIAGNOSIS — M272 Inflammatory conditions of jaws: Secondary | ICD-10-CM | POA: Diagnosis not present

## 2018-08-21 DIAGNOSIS — M272 Inflammatory conditions of jaws: Secondary | ICD-10-CM | POA: Diagnosis not present

## 2018-08-22 DIAGNOSIS — M272 Inflammatory conditions of jaws: Secondary | ICD-10-CM | POA: Diagnosis not present

## 2018-08-23 DIAGNOSIS — M272 Inflammatory conditions of jaws: Secondary | ICD-10-CM | POA: Diagnosis not present

## 2018-08-24 DIAGNOSIS — M272 Inflammatory conditions of jaws: Secondary | ICD-10-CM | POA: Diagnosis not present

## 2018-08-28 DIAGNOSIS — M272 Inflammatory conditions of jaws: Secondary | ICD-10-CM | POA: Diagnosis not present

## 2018-08-30 DIAGNOSIS — M272 Inflammatory conditions of jaws: Secondary | ICD-10-CM | POA: Diagnosis not present

## 2018-08-31 DIAGNOSIS — M272 Inflammatory conditions of jaws: Secondary | ICD-10-CM | POA: Diagnosis not present

## 2018-09-03 ENCOUNTER — Encounter (HOSPITAL_BASED_OUTPATIENT_CLINIC_OR_DEPARTMENT_OTHER): Payer: Managed Care, Other (non HMO) | Attending: Internal Medicine

## 2018-09-03 DIAGNOSIS — Y842 Radiological procedure and radiotherapy as the cause of abnormal reaction of the patient, or of later complication, without mention of misadventure at the time of the procedure: Secondary | ICD-10-CM | POA: Insufficient documentation

## 2018-09-03 DIAGNOSIS — C090 Malignant neoplasm of tonsillar fossa: Secondary | ICD-10-CM | POA: Diagnosis not present

## 2018-09-03 DIAGNOSIS — M272 Inflammatory conditions of jaws: Secondary | ICD-10-CM | POA: Diagnosis present

## 2018-09-04 DIAGNOSIS — M272 Inflammatory conditions of jaws: Secondary | ICD-10-CM | POA: Diagnosis not present

## 2018-09-05 DIAGNOSIS — M272 Inflammatory conditions of jaws: Secondary | ICD-10-CM | POA: Diagnosis not present

## 2018-09-06 DIAGNOSIS — M272 Inflammatory conditions of jaws: Secondary | ICD-10-CM | POA: Diagnosis not present

## 2018-09-07 DIAGNOSIS — M272 Inflammatory conditions of jaws: Secondary | ICD-10-CM | POA: Diagnosis not present

## 2018-09-10 DIAGNOSIS — M272 Inflammatory conditions of jaws: Secondary | ICD-10-CM | POA: Diagnosis not present

## 2018-09-11 DIAGNOSIS — M272 Inflammatory conditions of jaws: Secondary | ICD-10-CM | POA: Diagnosis not present

## 2018-09-12 DIAGNOSIS — M272 Inflammatory conditions of jaws: Secondary | ICD-10-CM | POA: Diagnosis not present

## 2018-10-01 DIAGNOSIS — M272 Inflammatory conditions of jaws: Secondary | ICD-10-CM | POA: Diagnosis not present

## 2018-10-03 ENCOUNTER — Encounter (HOSPITAL_BASED_OUTPATIENT_CLINIC_OR_DEPARTMENT_OTHER): Payer: Managed Care, Other (non HMO) | Attending: Physician Assistant

## 2018-10-03 DIAGNOSIS — C090 Malignant neoplasm of tonsillar fossa: Secondary | ICD-10-CM | POA: Insufficient documentation

## 2018-10-03 DIAGNOSIS — Y842 Radiological procedure and radiotherapy as the cause of abnormal reaction of the patient, or of later complication, without mention of misadventure at the time of the procedure: Secondary | ICD-10-CM | POA: Insufficient documentation

## 2018-10-03 DIAGNOSIS — M272 Inflammatory conditions of jaws: Secondary | ICD-10-CM | POA: Insufficient documentation

## 2018-10-15 DIAGNOSIS — M272 Inflammatory conditions of jaws: Secondary | ICD-10-CM | POA: Diagnosis present

## 2018-10-15 DIAGNOSIS — C090 Malignant neoplasm of tonsillar fossa: Secondary | ICD-10-CM | POA: Diagnosis not present

## 2018-10-15 DIAGNOSIS — Y842 Radiological procedure and radiotherapy as the cause of abnormal reaction of the patient, or of later complication, without mention of misadventure at the time of the procedure: Secondary | ICD-10-CM | POA: Diagnosis not present

## 2018-10-16 DIAGNOSIS — M272 Inflammatory conditions of jaws: Secondary | ICD-10-CM | POA: Diagnosis not present

## 2018-10-18 DIAGNOSIS — M272 Inflammatory conditions of jaws: Secondary | ICD-10-CM | POA: Diagnosis not present

## 2018-10-19 DIAGNOSIS — M272 Inflammatory conditions of jaws: Secondary | ICD-10-CM | POA: Diagnosis not present

## 2018-10-22 DIAGNOSIS — M272 Inflammatory conditions of jaws: Secondary | ICD-10-CM | POA: Diagnosis not present

## 2018-10-23 DIAGNOSIS — M272 Inflammatory conditions of jaws: Secondary | ICD-10-CM | POA: Diagnosis not present

## 2018-10-24 DIAGNOSIS — M272 Inflammatory conditions of jaws: Secondary | ICD-10-CM | POA: Diagnosis not present

## 2018-10-25 DIAGNOSIS — M272 Inflammatory conditions of jaws: Secondary | ICD-10-CM | POA: Diagnosis not present

## 2018-10-26 DIAGNOSIS — M272 Inflammatory conditions of jaws: Secondary | ICD-10-CM | POA: Diagnosis not present

## 2018-11-02 ENCOUNTER — Encounter: Payer: Self-pay | Admitting: *Deleted

## 2018-11-02 NOTE — Progress Notes (Signed)
Oncology Nurse Navigator Documentation  Flowsheet/spreadsheet update.  Rick Fedra Lanter, RN, BSN Head & Neck Oncology Nurse Navigator [facility] [place] [phone]   

## 2019-06-09 IMAGING — CR CHEST - 2 VIEW
2 series · 2 of 2 positions shown · non-contrast
Comparison: None.

CLINICAL DATA: Preop

EXAM:
CHEST - 2 VIEW

[w chest pa]
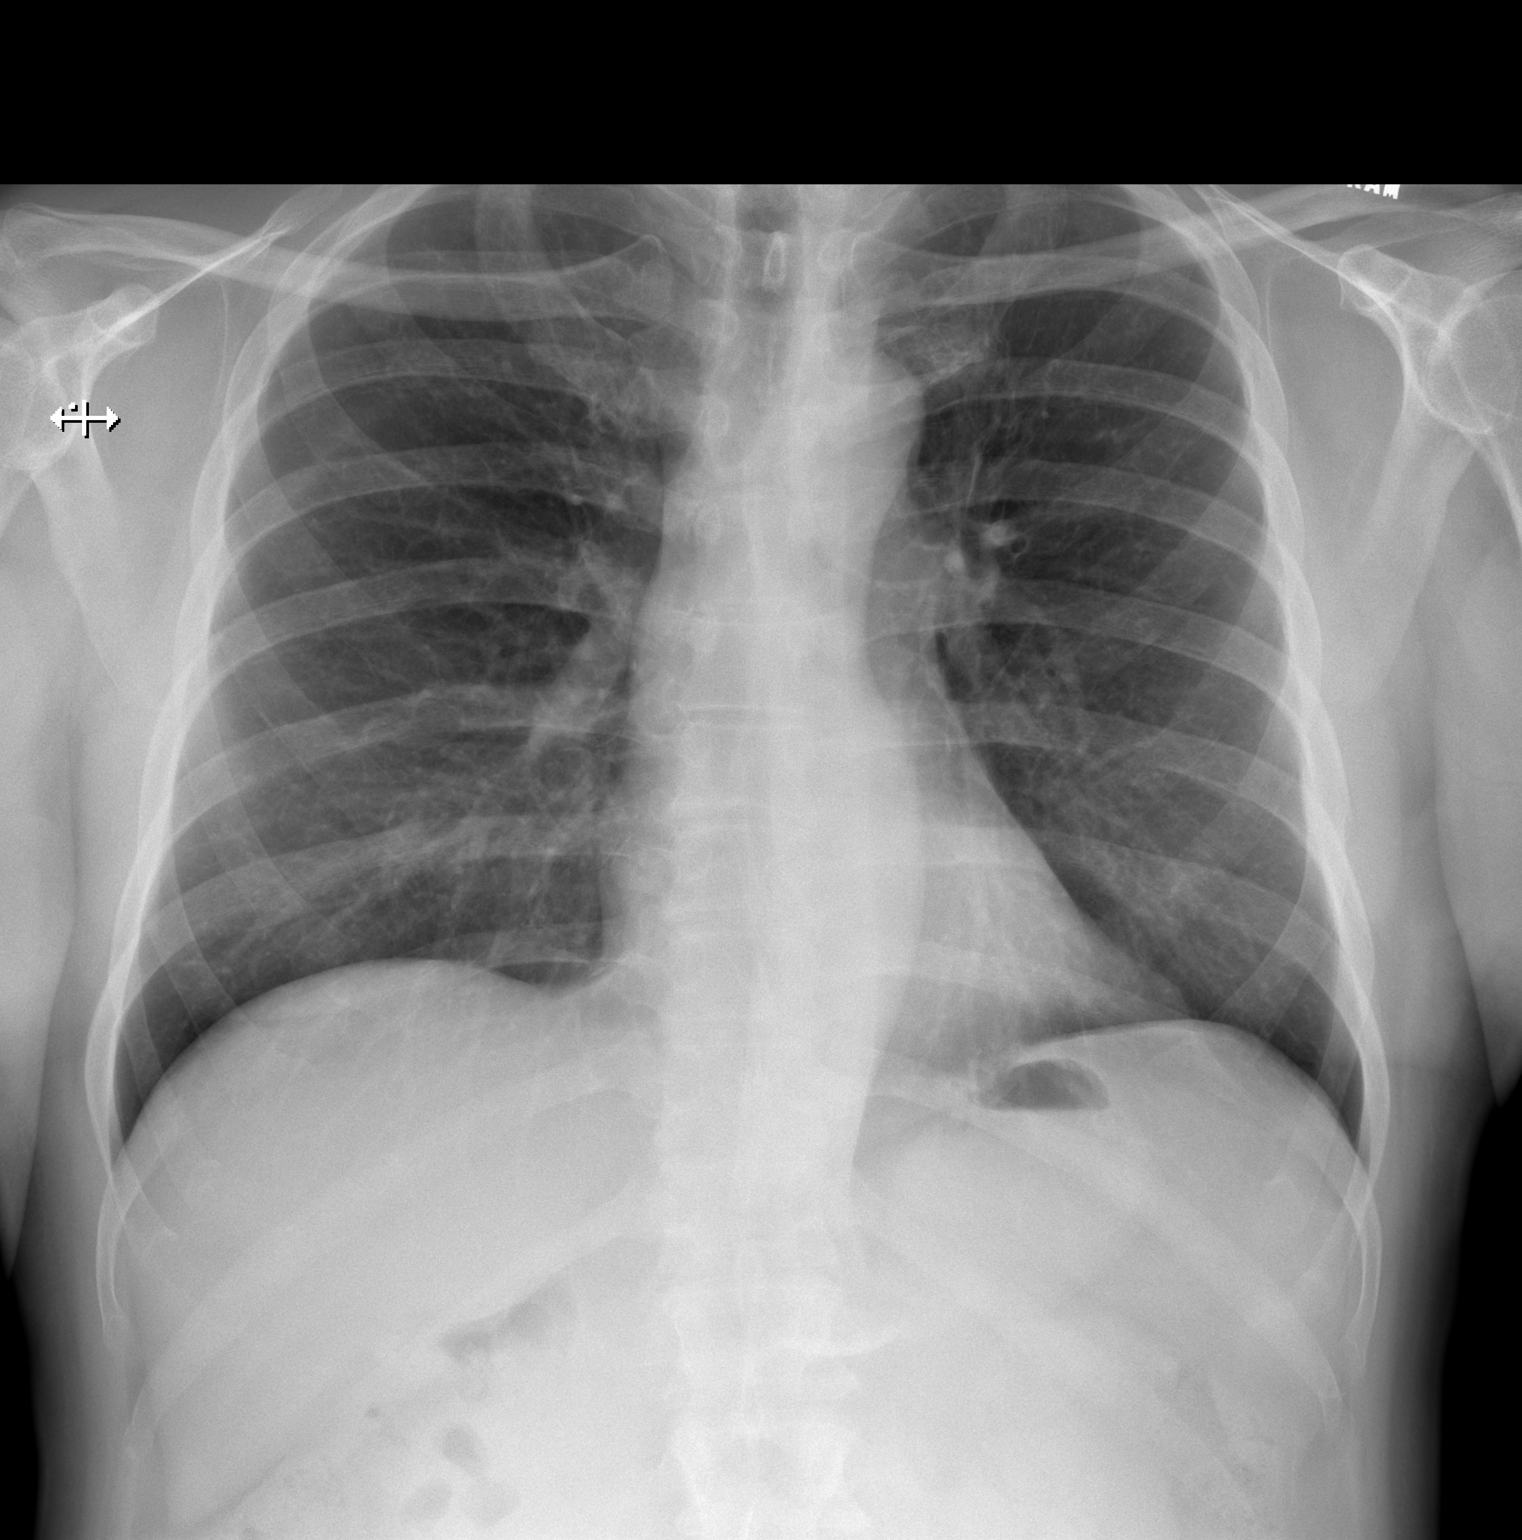

[w chest lat]
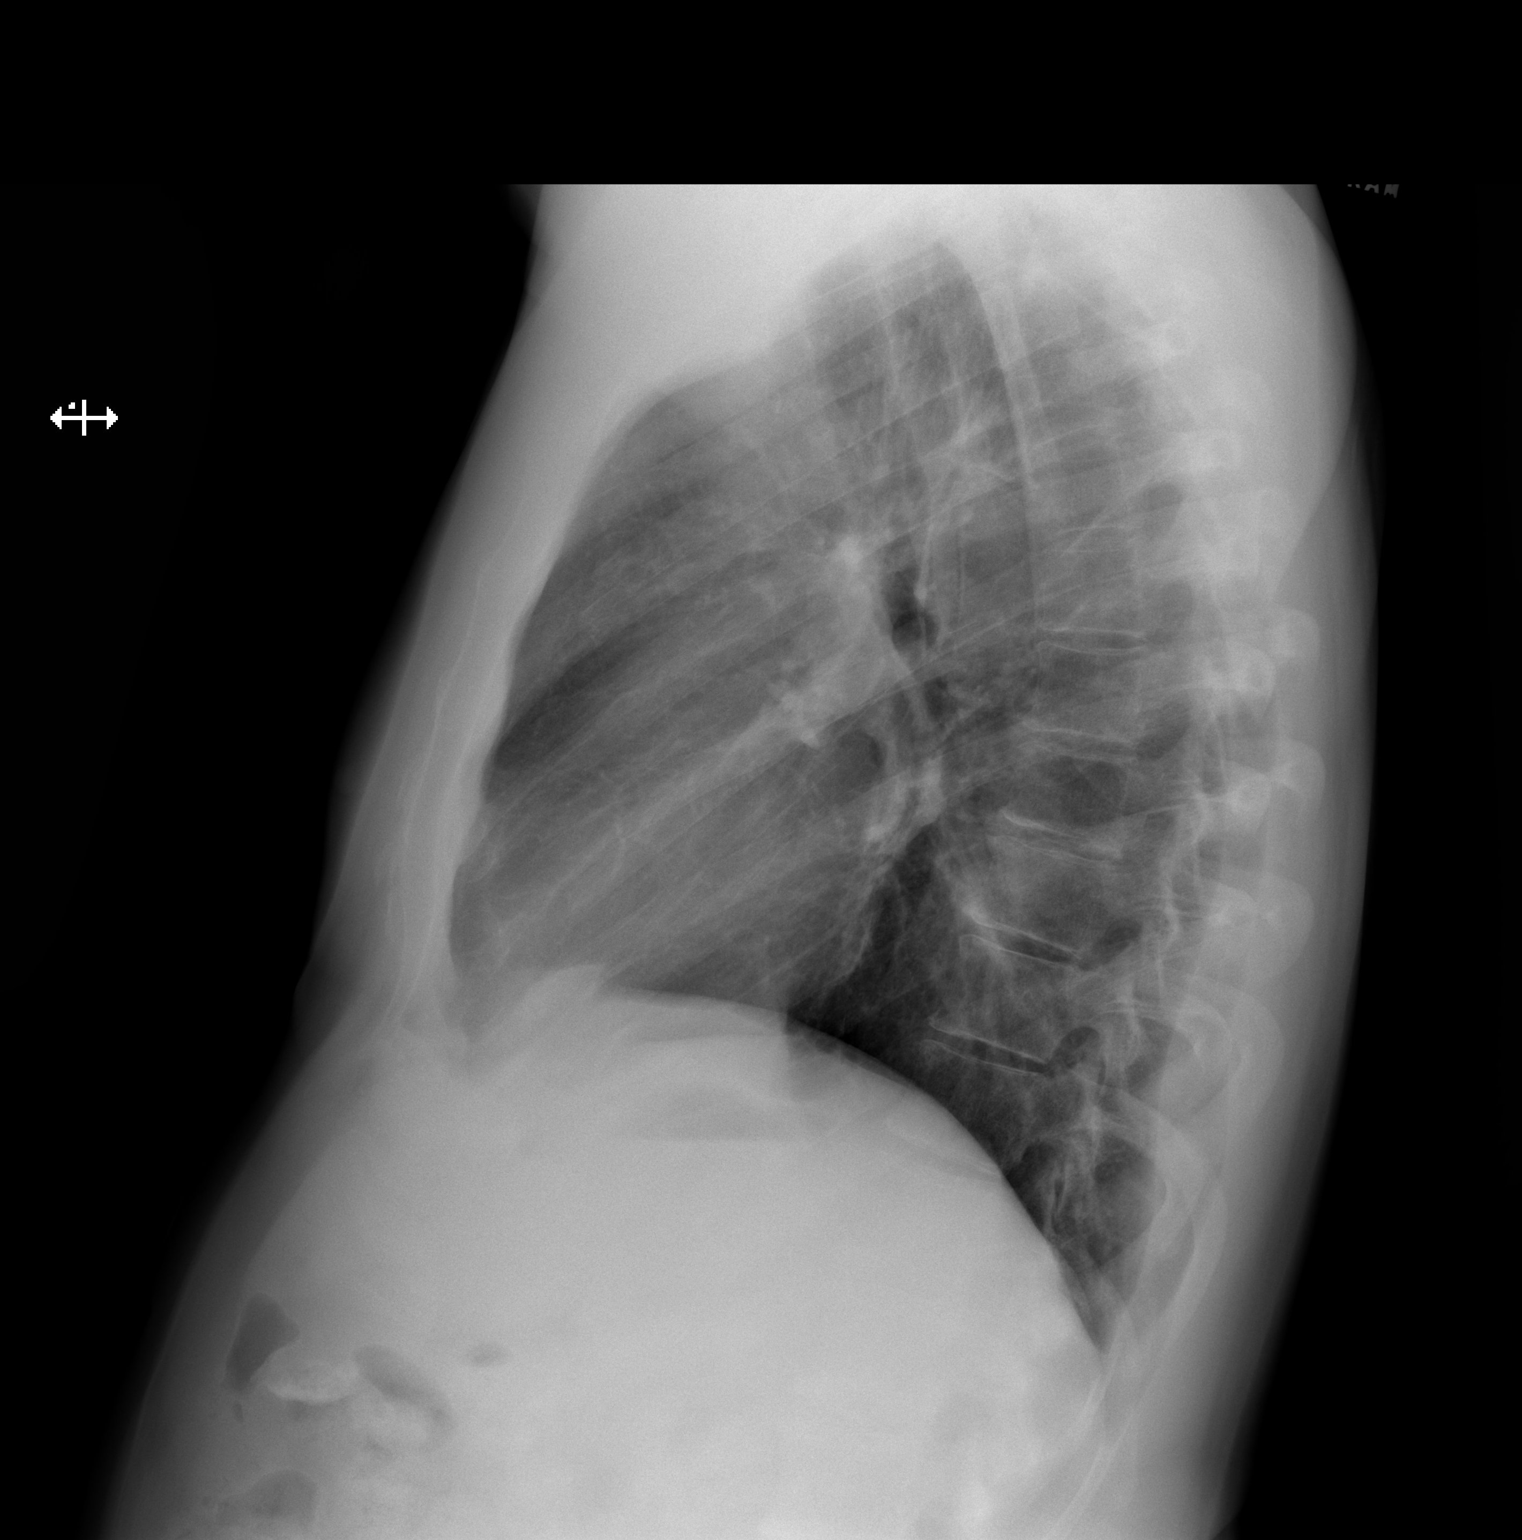

[2 of 2 positions shown; findings below may reference images not displayed]

FINDINGS: The heart size and mediastinal contours are within normal limits.
Both lungs are clear. The visualized skeletal structures are
unremarkable.
IMPRESSION: No active cardiopulmonary disease.

## 2021-06-14 ENCOUNTER — Telehealth: Payer: Self-pay | Admitting: Acute Care

## 2021-06-14 NOTE — Telephone Encounter (Signed)
Contacted patient regarding [facility] referral.  Patient did not meet eligibility requirements due to he states he quit smoking 20 years ago. Will fax referring provider a message that patient would need scanning by PCP, if recommended.  Referral was closed ?

## 2021-06-18 ENCOUNTER — Encounter: Payer: Self-pay | Admitting: Gastroenterology

## 2021-06-21 ENCOUNTER — Other Ambulatory Visit: Payer: Self-pay | Admitting: Physician Assistant

## 2021-06-21 DIAGNOSIS — R053 Chronic cough: Secondary | ICD-10-CM

## 2021-07-02 ENCOUNTER — Other Ambulatory Visit: Payer: Self-pay

## 2021-07-02 ENCOUNTER — Ambulatory Visit (AMBULATORY_SURGERY_CENTER): Payer: Managed Care, Other (non HMO)

## 2021-07-02 VITALS — Ht 70.0 in | Wt 181.0 lb

## 2021-07-02 DIAGNOSIS — Z1211 Encounter for screening for malignant neoplasm of colon: Secondary | ICD-10-CM

## 2021-07-02 MED ORDER — CLENPIQ 10-3.5-12 MG-GM -GM/160ML PO SOLN: 1.0000 | ORAL | 0 refills | Status: DC

## 2021-07-02 NOTE — Progress Notes (Signed)
No egg or soy allergy known to patient  ?No issues known to pt with past sedation with any surgeries or procedures ?Patient denies ever being told they had issues or difficulty with intubation  ?No FH of Malignant Hyperthermia ?Pt is not on diet pills ?Pt is not on home 02  ?Pt is not on blood thinners  ?Pt denies issues with constipation - "total opposite issue at times" ?No A fib or A flutter ?Clenpiq coupon to pt in PV today and NO PA's for preps discussed with pt in PV today  ?Discussed with pt there will be an out-of-pocket cost for prep and that varies from $0 to 70 + dollars - pt verbalized understanding  ?Due to the COVID-19 pandemic we are asking patients to follow certain guidelines in PV and the [facility]   ?Pt aware of COVID protocols and [facility] guidelines  ?PV completed over the phone. Pt verified name, DOB, address and insurance during PV today.  ?Pt mailed instruction packet with copy of consent form to read and not return, and instructions.  ?Pt encouraged to call with questions or issues.  ?If pt has My chart, procedure instructions sent via My Chart  ? ?

## 2021-07-14 ENCOUNTER — Ambulatory Visit
Admission: RE | Admit: 2021-07-14 | Discharge: 2021-07-14 | Disposition: A | Discharge status: Home / Self Care | Payer: HMO | Source: Ambulatory Visit | Attending: Physician Assistant | Admitting: Physician Assistant

## 2021-07-14 DIAGNOSIS — R053 Chronic cough: Secondary | ICD-10-CM

## 2021-07-20 ENCOUNTER — Telehealth: Payer: Self-pay | Admitting: Gastroenterology

## 2021-07-20 NOTE — Telephone Encounter (Signed)
Patients wife called and stated that she would like to get patient back on for 4/21 at 9:00. Patient was released from jury duty. Patient was rescheduled as he was before for colon procedure on 4/21 at 9:00.  ?

## 2021-07-20 NOTE — Telephone Encounter (Signed)
Patient called to cancel procedure scheduled 07/23/21. Per patient, was summoned for jury duty. Patient states he will call to reschedule at a later time.  ?

## 2021-07-23 ENCOUNTER — Encounter: Payer: Managed Care, Other (non HMO) | Admitting: Gastroenterology

## 2021-07-23 ENCOUNTER — Ambulatory Visit (AMBULATORY_SURGERY_CENTER): Payer: HMO | Admitting: Gastroenterology

## 2021-07-23 ENCOUNTER — Encounter: Payer: Self-pay | Admitting: Gastroenterology

## 2021-07-23 VITALS — BP 112/74 | HR 64 | Temp 98.4°F | Resp 10 | Ht 70.0 in | Wt 181.0 lb

## 2021-07-23 DIAGNOSIS — D123 Benign neoplasm of transverse colon: Secondary | ICD-10-CM

## 2021-07-23 DIAGNOSIS — D122 Benign neoplasm of ascending colon: Secondary | ICD-10-CM

## 2021-07-23 DIAGNOSIS — Z1211 Encounter for screening for malignant neoplasm of colon: Secondary | ICD-10-CM

## 2021-07-23 DIAGNOSIS — K640 First degree hemorrhoids: Secondary | ICD-10-CM

## 2021-07-23 DIAGNOSIS — K5730 Diverticulosis of large intestine without perforation or abscess without bleeding: Secondary | ICD-10-CM

## 2021-07-23 DIAGNOSIS — K621 Rectal polyp: Secondary | ICD-10-CM

## 2021-07-23 DIAGNOSIS — D128 Benign neoplasm of rectum: Secondary | ICD-10-CM

## 2021-07-23 MED ORDER — SODIUM CHLORIDE 0.9 % IV SOLN: 500.0000 mL | Freq: Once | INTRAVENOUS | Status: DC

## 2021-07-23 NOTE — Op Note (Signed)
[facility] ?Patient Name: Douglas Norris ?Procedure Date: 07/23/2021 9:21 AM ?MRN: [id number] ?Endoscopist: Gerrit Heck , MD ?Age: 66 ?Referring MD:  ?Date of Birth: 1955-10-19 ?Gender: Male ?Account #: [id number] ?Procedure:                Colonoscopy ?Indications:              Screening for colorectal malignant neoplasm, This  ?                          is the patient's first colonoscopy ?Medicines:                Monitored Anesthesia Care ?Procedure:                Pre-Anesthesia Assessment: ?                          - Prior to the procedure, a History and Physical  ?                          was performed, and patient medications and  ?                          allergies were reviewed. The patient's tolerance of  ?                          previous anesthesia was also reviewed. The risks  ?                          and benefits of the procedure and the sedation  ?                          options and risks were discussed with the patient.  ?                          All questions were answered, and informed consent  ?                          was obtained. Prior Anticoagulants: The patient has  ?                          taken no previous anticoagulant or antiplatelet  ?                          agents. ASA Grade Assessment: II - A patient with  ?                          mild systemic disease. After reviewing the risks  ?                          and benefits, the patient was deemed in  ?                          satisfactory condition to undergo the procedure. ?  After obtaining informed consent, the colonoscope  ?                          was passed under direct vision. Throughout the  ?                          procedure, the patient's blood pressure, pulse, and  ?                          oxygen saturations were monitored continuously. The  ?                          CF [id number] #[id number] was introduced through the anus  ?                          and advanced to the  the terminal ileum. The  ?                          colonoscopy was performed without difficulty. The  ?                          patient tolerated the procedure well. The quality  ?                          of the bowel preparation was good. The terminal  ?                          ileum, ileocecal valve, appendiceal orifice, and  ?                          rectum were photographed. ?Scope In: 9:27:00 AM ?Scope Out: 9:48:11 AM ?Scope Withdrawal Time: 0 hours 18 minutes 16 seconds  ?Total Procedure Duration: 0 hours 21 minutes 11 seconds  ?Findings:                 The perianal and digital rectal examinations were  ?                          normal. ?                          Three sessile polyps were found in the transverse  ?                          colon (1) and ascending colon (2). The polyps were  ?                          3 to 5 mm in size. These polyps were removed with a  ?                          cold snare. Resection and retrieval were complete.  ?                          Estimated blood loss was minimal. ?  A 3 mm polyp was found in the rectum. The polyp was  ?                          sessile. The polyp was removed with a cold snare.  ?                          Resection and retrieval were complete. Estimated  ?                          blood loss was minimal. ?                          A few small-mouthed diverticula were found in the  ?                          sigmoid colon. ?                          Non-bleeding internal hemorrhoids were found during  ?                          retroflexion. The hemorrhoids were small. ?                          The terminal ileum appeared normal. ?Complications:            No immediate complications. ?Estimated Blood Loss:     Estimated blood loss was minimal. ?Impression:               - Three 3 to 5 mm polyps in the transverse colon  ?                          and in the ascending colon, removed with a cold  ?                           snare. Resected and retrieved. ?                          - One 3 mm polyp in the rectum, removed with a cold  ?                          snare. Resected and retrieved. ?                          - Diverticulosis in the sigmoid colon. ?                          - Non-bleeding internal hemorrhoids. ?                          - The examined portion of the ileum was normal. ?Recommendation:           - Patient has a contact number available for  ?  emergencies. The signs and symptoms of potential  ?                          delayed complications were discussed with the  ?                          patient. Return to normal activities tomorrow.  ?                          Written discharge instructions were provided to the  ?                          patient. ?                          - Resume previous diet. ?                          - Continue present medications. ?                          - Await pathology results. ?                          - Repeat colonoscopy for surveillance based on  ?                          pathology results. ?                          - Return to GI office PRN. ?Gerrit Heck, MD ?07/23/2021 9:52:53 AM ?

## 2021-07-23 NOTE — Progress Notes (Signed)
PT taken to PACU. Monitors in place. VSS. Report given to RN. 

## 2021-07-23 NOTE — Patient Instructions (Signed)
Impression/Recommendations: ? ?Polyp, diverticulosis, and hemorrhoid handouts given to patient. ? ?Resume previous diet. ?Continue present medications. ?Await pathology results. ? ?Repeat colonoscopy for surveillance.  Date to be determined after pathology results reviewed. ? ?Return to GI office as needed. ? ?YOU HAD AN ENDOSCOPIC PROCEDURE TODAY AT [facility]:   Refer to the procedure report that was given to you for any specific questions about what was found during the examination.  If the procedure report does not answer your questions, please call your gastroenterologist to clarify.  If you requested that your care partner not be given the details of your procedure findings, then the procedure report has been included in a sealed envelope for you to review at your convenience later. ? ?YOU SHOULD EXPECT: Some feelings of bloating in the abdomen. Passage of more gas than usual.  Walking can help get rid of the air that was put into your GI tract during the procedure and reduce the bloating. If you had a lower endoscopy (such as a colonoscopy or flexible sigmoidoscopy) you may notice spotting of blood in your stool or on the toilet paper. If you underwent a bowel prep for your procedure, you may not have a normal bowel movement for a few days. ? ?Please Note:  You might notice some irritation and congestion in your nose or some drainage.  This is from the oxygen used during your procedure.  There is no need for concern and it should clear up in a day or so. ? ?SYMPTOMS TO REPORT IMMEDIATELY: ? ?Following lower endoscopy (colonoscopy or flexible sigmoidoscopy): ? Excessive amounts of blood in the stool ? Significant tenderness or worsening of abdominal pains ? Swelling of the abdomen that is new, acute ? Fever of 100?F or higher ? ? ?For urgent or emergent issues, a gastroenterologist can be reached at any hour by calling [phone]. ?Do not use MyChart messaging for urgent concerns.   ? ? ?DIET:  We do recommend a small meal at first, but then you may proceed to your regular diet.  Drink plenty of fluids but you should avoid alcoholic beverages for 24 hours. ? ?ACTIVITY:  You should plan to take it easy for the rest of today and you should NOT DRIVE or use heavy machinery until tomorrow (because of the sedation medicines used during the test).   ? ?FOLLOW UP: ?Our staff will call the number listed on your records 48-72 hours following your procedure to check on you and address any questions or concerns that you may have regarding the information given to you following your procedure. If we do not reach you, we will leave a message.  We will attempt to reach you two times.  During this call, we will ask if you have developed any symptoms of COVID 19. If you develop any symptoms (ie: fever, flu-like symptoms, shortness of breath, cough etc.) before then, please call [phone].  If you test positive for Covid 19 in the 2 weeks post procedure, please call and report this information to [place].   ? ?If any biopsies were taken you will be contacted by phone or by letter within the next 1-3 weeks.  Please call us at [phone] if you have not heard about the biopsies in 3 weeks.  ? ? ?SIGNATURES/CONFIDENTIALITY: ?You and/or your care partner have signed paperwork which will be entered into your electronic medical record.  These signatures attest to the fact that that the information above on your After Visit  Summary has been reviewed and is understood.  Full responsibility of the confidentiality of this discharge information lies with you and/or your care-partner.  ?

## 2021-07-23 NOTE — Progress Notes (Signed)
VS  DT ? ?Pt's states no medical or surgical changes since previsit or office visit. ? ?

## 2021-07-23 NOTE — Progress Notes (Signed)
? ?GASTROENTEROLOGY PROCEDURE H&P NOTE  ? ?Primary Care Physician: ?Patient, No Pcp Per (Inactive) ? ? ? ?Reason for Procedure:  Colon Cancer screening ? ?Plan:    Colonoscopy ? ?Patient is appropriate for endoscopic procedure(s) in the ambulatory ([place]) setting. ? ?The nature of the procedure, as well as the risks, benefits, and alternatives were carefully and thoroughly reviewed with the patient. Ample time for discussion and questions allowed. The patient understood, was satisfied, and agreed to proceed.  ? ? ? ?HPI: ?Douglas Norris is a 66 y.o. male who presents for colonoscopy for routine Colon Cancer screening.  No active GI symptoms.   ? ?Past Medical History:  ?Diagnosis Date  ? Heart murmur   ? as a child  ? History of radiation therapy 08/10/16- 09/29/16  ? Left Tonsil 70 Gy in 35 fractions to gross disease, 63 Gy in 35 fraction to high risk nodal echelons, and 56 Gy in 35 fraction to intermediate risk nodal echelons.   ? Hyperlipidemia   ? diet controlled  ? Seasonal allergies   ? 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 ? ?Past Surgical History:  ?Procedure Laterality Date  ? IR FLUORO GUIDE PORT INSERTION RIGHT  08/05/2016  ? IR GASTROSTOMY TUBE MOD SED  08/05/2016  ? IR GASTROSTOMY TUBE REMOVAL  01/03/2017  ? IR REMOVAL TUN ACCESS W/ PORT W/O FL MOD SED  01/03/2017  ? IR US GUIDE VASC ACCESS RIGHT  08/05/2016  ? LAPAROSCOPIC APPENDECTOMY  03/02/2012  ? Procedure: APPENDECTOMY LAPAROSCOPIC;  Surgeon: Joyice Faster. Cornett, MD;  Location: [facility];  Service: General;  Laterality: N/A;  ? MASS EXCISION Left 11/01/2016  ? Procedure: EXCISION DEEP CERVICAL NODE;  Surgeon: Rozetta Nunnery, MD;  Location: [facility];  Service: ENT;  Laterality: Left;  ? WISDOM TOOTH EXTRACTION    ? lower molars bilat  ? ? ?Prior to Admission medications   ?Medication Sig Start Date End Date Taking? Authorizing Provider  ?Ascorbic Acid (VITAMIN C PO) Take 1 tablet by mouth daily at 6 (six) AM.    Yes [provider]  ?CALCIUM-MAGNESIUM-ZINC PO Take 1 tablet by mouth daily at 6 (six) AM.   Yes [provider]  ?Ferrous Sulfate (IRON PO) Take 1 tablet by mouth daily at 6 (six) AM.   Yes [provider]  ?Fexofenadine-Pseudoephedrine (ALLEGRA-D 24 HOUR PO) Take 1 tablet by mouth daily at 2 am.   Yes [provider]  ?Melatonin 10 MG TABS Take 10 mg by mouth at bedtime as needed (sleep).    Yes [provider]  ?Multiple Vitamin (MULTIVITAMIN) tablet Take 1 tablet by mouth daily.   Yes [provider]  ?NIACIN PO Take 1 tablet by mouth daily at 6 (six) AM.   Yes [provider]  ?Omega-3 Fatty Acids (FISH OIL OMEGA-3 PO) Take by mouth.   Yes [provider]  ?RESVERATROL PO Take 1 tablet by mouth daily at 6 (six) AM.   Yes [provider]  ?TURMERIC PO Take 1 capsule by mouth 2 (two) times daily.   Yes [provider]  ?VALERIAN ROOT PO Take 1 tablet by mouth daily.    Yes [provider]  ?prochlorperazine (COMPAZINE) 10 MG tablet Take 1 tablet (10 mg total) by mouth every 6 (six) hours as needed (Nausea or vomiting). 08/09/16 09/22/16  Heath Lark, MD  ? ? ?Current Outpatient Medications  ?Medication Sig Dispense Refill  ? Ascorbic Acid (VITAMIN C PO)  Take 1 tablet by mouth daily at 6 (six) AM.    ? CALCIUM-MAGNESIUM-ZINC PO Take 1 tablet by mouth daily at 6 (six) AM.    ? Ferrous Sulfate (IRON PO) Take 1 tablet by mouth daily at 6 (six) AM.    ? Fexofenadine-Pseudoephedrine (ALLEGRA-D 24 HOUR PO) Take 1 tablet by mouth daily at 2 am.    ? Melatonin 10 MG TABS Take 10 mg by mouth at bedtime as needed (sleep).     ? Multiple Vitamin (MULTIVITAMIN) tablet Take 1 tablet by mouth daily.    ? NIACIN PO Take 1 tablet by mouth daily at 6 (six) AM.    ? Omega-3 Fatty Acids (FISH OIL OMEGA-3 PO) Take by mouth.    ? RESVERATROL PO Take 1 tablet by mouth daily at 6 (six) AM.    ? TURMERIC PO Take 1 capsule by mouth 2 (two)  times daily.    ? VALERIAN ROOT PO Take 1 tablet by mouth daily.     ? ?Current Facility-Administered Medications  ?Medication Dose Route Frequency Provider Last Rate Last Admin  ? 0.9 %  sodium chloride infusion  500 mL Intravenous Once Emanuele Mcwhirter V, DO      ? ? ?Allergies as of 07/23/2021  ? (No Known Allergies)  ? ? ?Family History  ?Problem Relation Age of Onset  ? Cancer Mother   ?     breast  ? Cancer Father   ?     pancreatic  ? Cancer Sister   ?     breast  ? Cancer Sister   ?     breast  ? Colon cancer Neg Hx   ? Colon polyps Neg Hx   ? Esophageal cancer Neg Hx   ? Rectal cancer Neg Hx   ? Stomach cancer Neg Hx   ? ? ?Social History  ? ?Socioeconomic History  ? Marital status: Married  ?  Spouse name: Not on file  ? Number of children: 0  ? Years of education: Not on file  ? Highest education level: Not on file  ?Occupational History  ? Occupation: unemployed  ?Tobacco Use  ? Smoking status: Former  ?  Types: Cigarettes  ?  Quit date: 07/04/2003  ?  Years since quitting: 18.0  ? Smokeless tobacco: Never  ?Vaping Use  ? Vaping Use: Never used  ?Substance and Sexual Activity  ? Alcohol use: No  ?  Comment: Quit 2005  ? Drug use: No  ? Sexual activity: Yes  ?  Comment: i partner in last 12 months  ?Other Topics Concern  ? Not on file  ?Social History Narrative  ? Not on file  ? ?Social Determinants of Health  ? ?Financial Resource Strain: Not on file  ?Food Insecurity: Not on file  ?Transportation Needs: Not on file  ?Physical Activity: Not on file  ?Stress: Not on file  ?Social Connections: Not on file  ?Intimate Partner Violence: Not on file  ? ? ?Physical Exam: ?Vital signs in last 24 hours: ?'@BP'$  114/74   Pulse 71   Temp 98.4 ?F (36.9 ?C) (Temporal)   Ht '5\' 10"'$  (1.778 m)   Wt 181 lb (82.1 kg)   SpO2 98%   BMI 25.97 kg/m?  ?GEN: NAD ?EYE: Sclerae anicteric ?ENT: MMM ?CV: Non-tachycardic ?Pulm: CTA b/l ?GI: Soft, NT/ND ?NEURO:  Alert & Oriented x 3 ? ? ?Gerrit Heck, DO ?[facility] ? ? ?07/23/2021 9:19 AM ? ?

## 2021-07-23 NOTE — Progress Notes (Signed)
Called to room to assist during endoscopic procedure.  Patient ID and intended procedure confirmed with present staff. Received instructions for my participation in the procedure from the performing physician.  

## 2021-07-27 ENCOUNTER — Telehealth: Payer: Self-pay | Admitting: *Deleted

## 2021-07-27 NOTE — Telephone Encounter (Signed)
?  Follow up Call- ? ? ?  07/23/2021  ?  8:04 AM  ?Call back number  ?Post procedure Call Back phone  # [phone]  ?Permission to leave phone message Yes  ?  ? ?Patient questions: ? ?Do you have a fever, pain , or abdominal swelling? No. ?Pain Score  0 * ? ?Have you tolerated food without any problems? Yes.   ? ?Have you been able to return to your normal activities? Yes.   ? ?Do you have any questions about your discharge instructions: ?Diet   No. ?Medications  No. ?Follow up visit  No. ? ?Do you have questions or concerns about your Care? No. ? ?Actions: ?* If pain score is 4 or above: ?No action needed, pain <4. ? ? ?

## 2021-07-28 ENCOUNTER — Encounter: Payer: Self-pay | Admitting: Gastroenterology

## 2022-05-15 IMAGING — CT CT CHEST W/O CM
1 of 2 series · 15 of 31 positions shown, 19 images · non-contrast
Comparison: Report for the chest radiographs done on 08/08/2018.

CLINICAL DATA: Chronic cough



[Series 6: super d · axial · 0.80mm/px · z∈[+836,+1141]mm · 15 of 427 slices shown, 19 images]
[im 23/427  mediastinal]
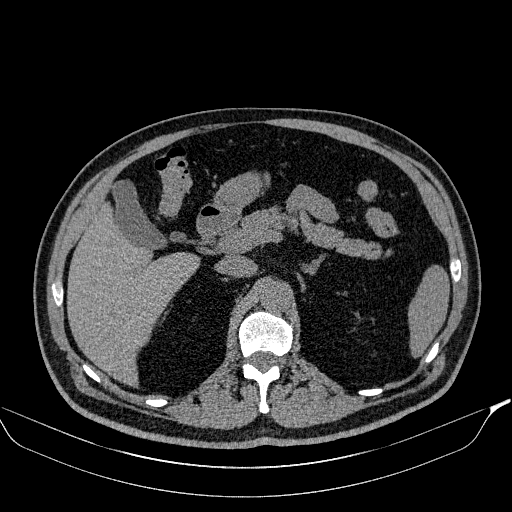
[im 23/427  lung]
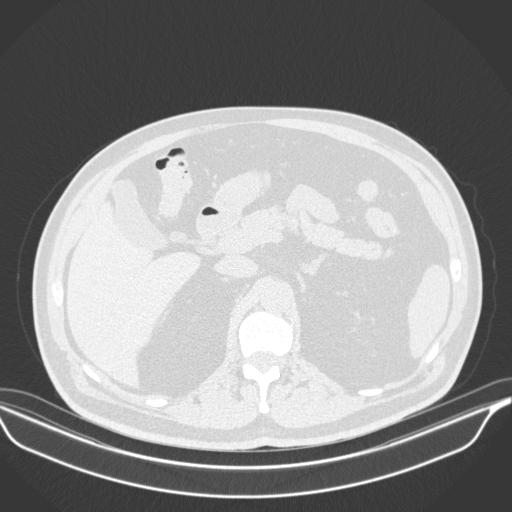
[im 68/427  lung]
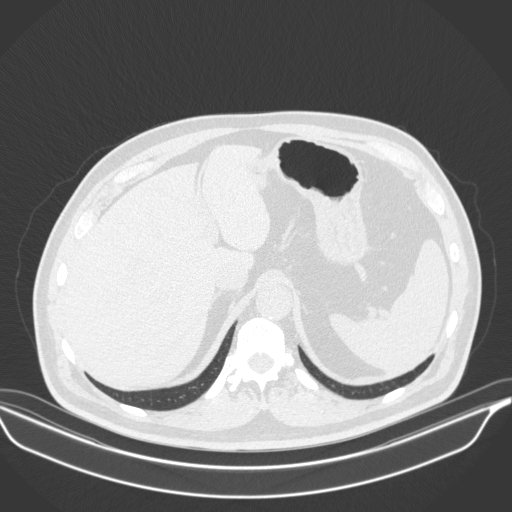
[im 90/427  lung]
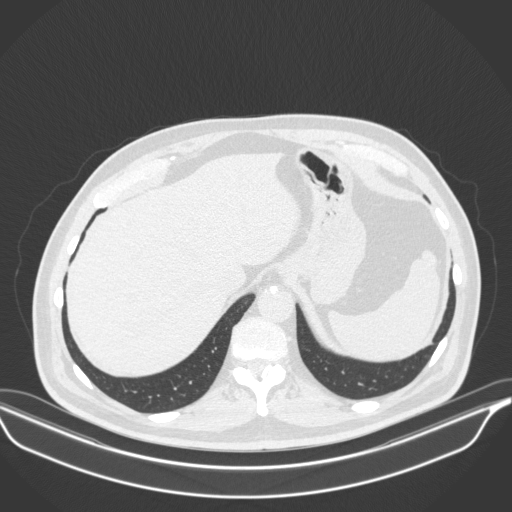
[im 113/427  lung]
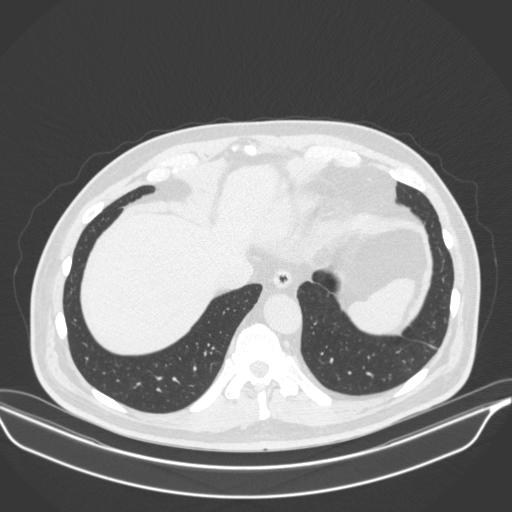
[im 143/427  mediastinal]
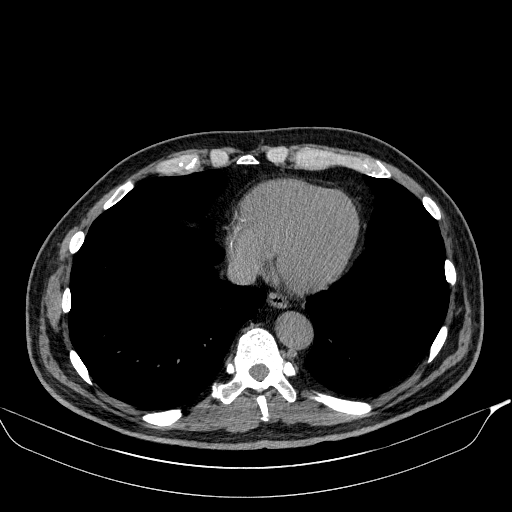
[im 143/427  lung]
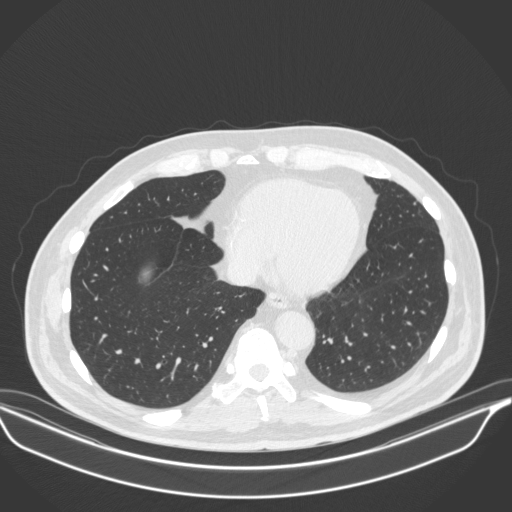
[im 157/427  lung]
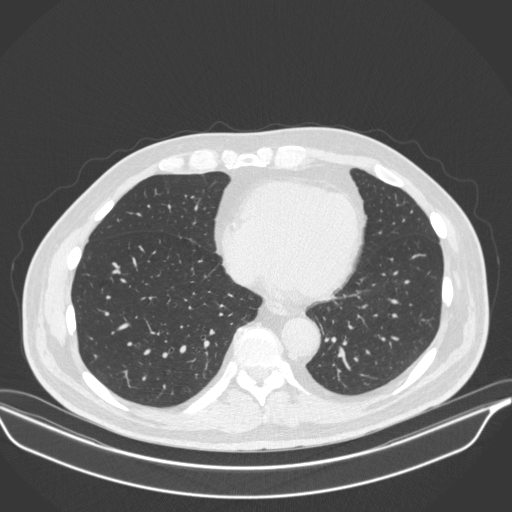
[im 180/427  lung]
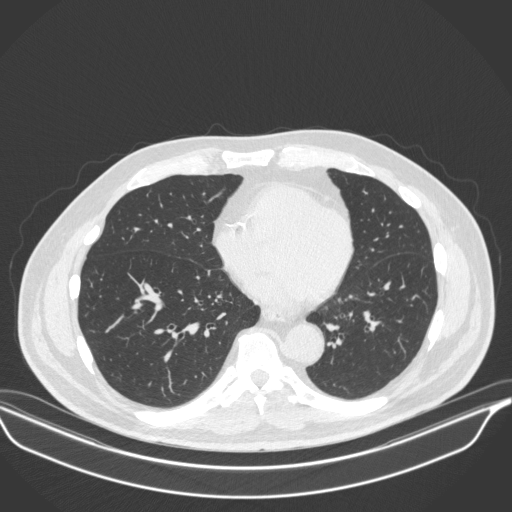
[im 225/427  lung]
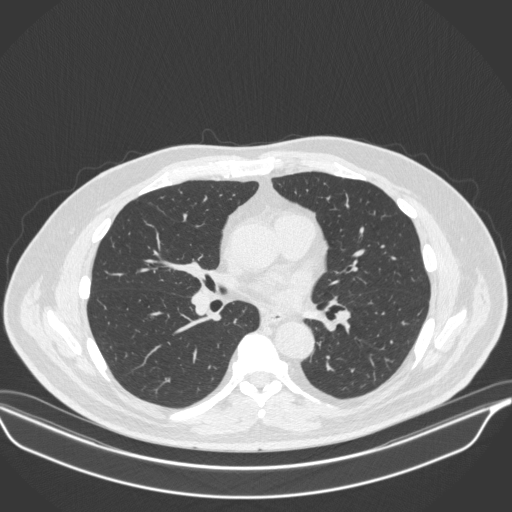
[im 247/427  mediastinal]
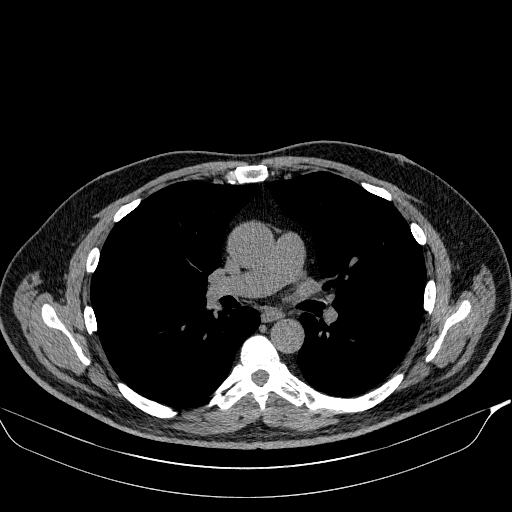
[im 247/427  lung]
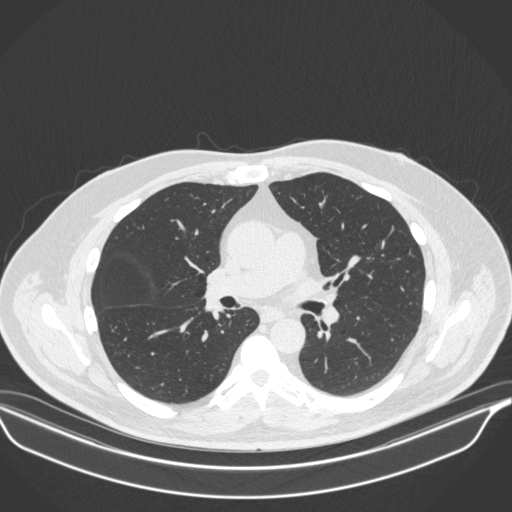
[im 270/427  lung]
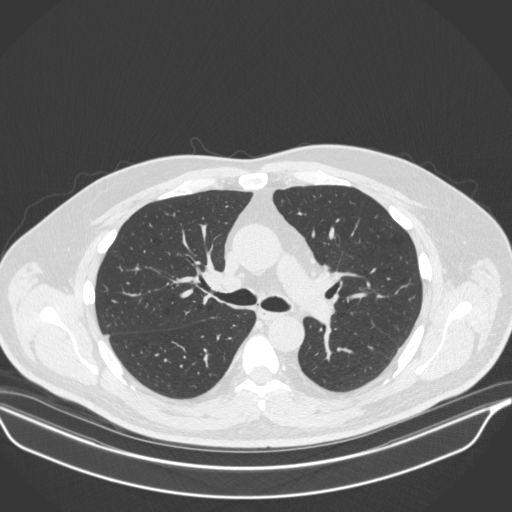
[im 292/427  lung]
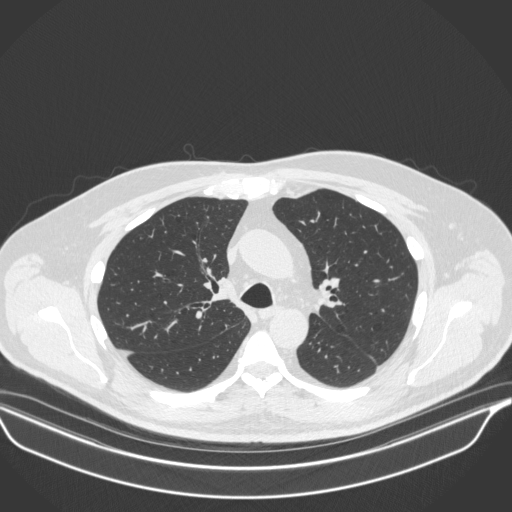
[im 314/427  lung]
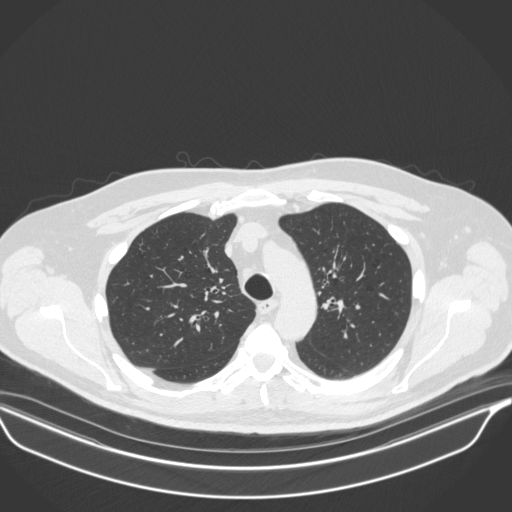
[im 337/427  mediastinal]
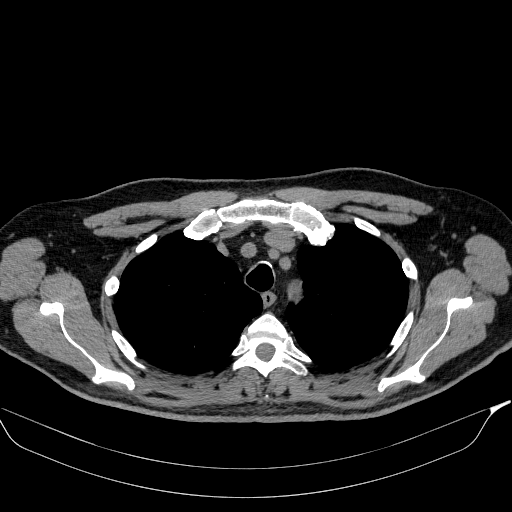
[im 337/427  lung]
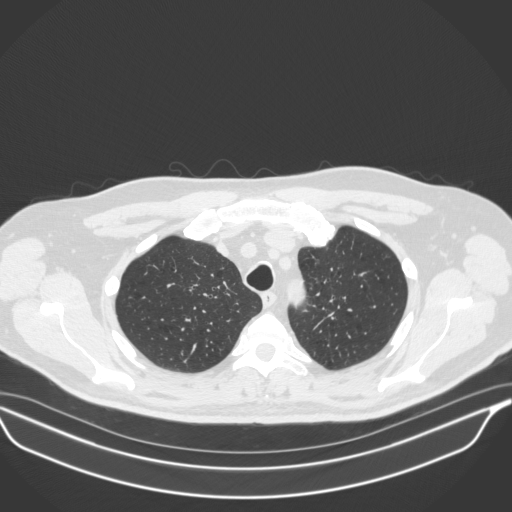
[im 382/427  lung]
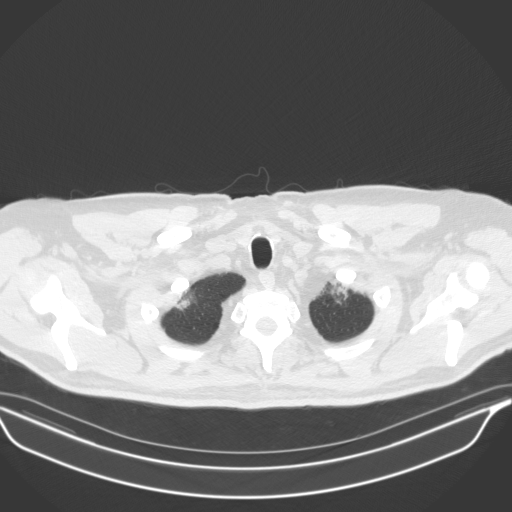
[im 404/427  lung]
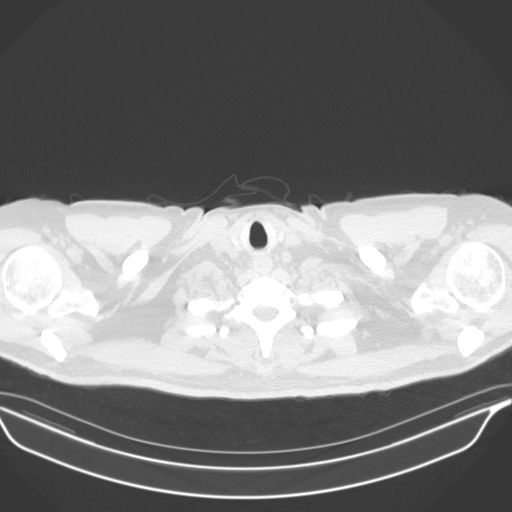

[15 of 31 positions shown; findings below may reference images not displayed]

Images of the study are not available for comparison due to
technical difficulties.
FINDINGS: Cardiovascular: Coronary artery calcifications are seen. There is
ectasia of ascending thoracic aorta measuring 3.8 cm.

Mediastinum/Nodes: There are subcentimeter nodes in the mediastinum.
Thyroid is smaller than usual in size.

Lungs/Pleura: Small patchy foci of increased interstitial markings
are noted in the subpleural location in the anterior aspect of both
apices. No other focal infiltrates are seen. There are no discrete
lung nodules. Linear density in left lower lobe may suggest minimal
scarring. There is no pleural effusion or pneumothorax.

Upper Abdomen: There are calcified gallbladder stones. Scattered
diverticula are seen in the visualized portions of colon.

Musculoskeletal: Degenerative changes are noted in the visualized
lower cervical spine with encroachment of neural foramina by bony
spurs. Facet hypertrophy is seen at multiple levels with
encroachment of neural foramina in the thoracic spine particularly
prominent on the left side at T4-T5 level.
IMPRESSION: Small foci of increased interstitial markings are seen in the
anterior aspects of both apices possibly suggesting scarring or
interstitial pneumonia. There are no discrete lung nodules.

Coronary artery calcifications are seen.  Gallbladder stones.

Other findings as described in the body of the report.

## 2022-06-06 DIAGNOSIS — J029 Acute pharyngitis, unspecified: Secondary | ICD-10-CM | POA: Diagnosis not present

## 2022-06-15 ENCOUNTER — Other Ambulatory Visit: Payer: Self-pay | Admitting: Physician Assistant

## 2022-06-15 DIAGNOSIS — I77810 Thoracic aortic ectasia: Secondary | ICD-10-CM | POA: Diagnosis not present

## 2022-06-15 DIAGNOSIS — I2510 Atherosclerotic heart disease of native coronary artery without angina pectoris: Secondary | ICD-10-CM | POA: Diagnosis not present

## 2022-06-15 DIAGNOSIS — Z125 Encounter for screening for malignant neoplasm of prostate: Secondary | ICD-10-CM | POA: Diagnosis not present

## 2022-06-15 DIAGNOSIS — L98491 Non-pressure chronic ulcer of skin of other sites limited to breakdown of skin: Secondary | ICD-10-CM | POA: Diagnosis not present

## 2022-06-15 DIAGNOSIS — L989 Disorder of the skin and subcutaneous tissue, unspecified: Secondary | ICD-10-CM | POA: Diagnosis not present

## 2022-06-15 DIAGNOSIS — Z0000 Encounter for general adult medical examination without abnormal findings: Secondary | ICD-10-CM | POA: Diagnosis not present

## 2022-06-15 DIAGNOSIS — E782 Mixed hyperlipidemia: Secondary | ICD-10-CM | POA: Diagnosis not present

## 2022-06-15 DIAGNOSIS — Z85818 Personal history of malignant neoplasm of other sites of lip, oral cavity, and pharynx: Secondary | ICD-10-CM | POA: Diagnosis not present

## 2022-06-15 DIAGNOSIS — I700 Atherosclerosis of aorta: Secondary | ICD-10-CM | POA: Diagnosis not present

## 2022-06-24 DIAGNOSIS — R070 Pain in throat: Secondary | ICD-10-CM | POA: Diagnosis not present

## 2022-06-24 DIAGNOSIS — J342 Deviated nasal septum: Secondary | ICD-10-CM | POA: Diagnosis not present

## 2022-06-24 DIAGNOSIS — J300 Vasomotor rhinitis: Secondary | ICD-10-CM | POA: Diagnosis not present

## 2022-06-24 DIAGNOSIS — Z85818 Personal history of malignant neoplasm of other sites of lip, oral cavity, and pharynx: Secondary | ICD-10-CM | POA: Diagnosis not present

## 2022-06-24 DIAGNOSIS — Z85819 Personal history of malignant neoplasm of unspecified site of lip, oral cavity, and pharynx: Secondary | ICD-10-CM | POA: Diagnosis not present

## 2022-07-13 ENCOUNTER — Ambulatory Visit
Admission: RE | Admit: 2022-07-13 | Discharge: 2022-07-13 | Disposition: A | Discharge status: Home / Self Care | Payer: PPO | Source: Ambulatory Visit | Attending: Physician Assistant | Admitting: Physician Assistant

## 2022-07-13 DIAGNOSIS — I77810 Thoracic aortic ectasia: Secondary | ICD-10-CM | POA: Diagnosis not present

## 2022-07-13 DIAGNOSIS — R911 Solitary pulmonary nodule: Secondary | ICD-10-CM | POA: Diagnosis not present

## 2022-07-13 DIAGNOSIS — J439 Emphysema, unspecified: Secondary | ICD-10-CM | POA: Diagnosis not present

## 2022-07-13 MED ORDER — IOPAMIDOL (ISOVUE-370) INJECTION 76%
75.0000 mL | Freq: Once | INTRAVENOUS | Status: AC | PRN
Start: 2022-07-13 — End: 2022-07-13
  Administered 2022-07-13: 75 mL via INTRAVENOUS

## 2022-07-25 ENCOUNTER — Ambulatory Visit (INDEPENDENT_AMBULATORY_CARE_PROVIDER_SITE_OTHER): Payer: PPO | Admitting: Dermatology

## 2022-07-25 VITALS — BP 116/76 | HR 66

## 2022-07-25 DIAGNOSIS — L219 Seborrheic dermatitis, unspecified: Secondary | ICD-10-CM | POA: Diagnosis not present

## 2022-07-25 DIAGNOSIS — C44319 Basal cell carcinoma of skin of other parts of face: Secondary | ICD-10-CM | POA: Diagnosis not present

## 2022-07-25 DIAGNOSIS — L570 Actinic keratosis: Secondary | ICD-10-CM | POA: Diagnosis not present

## 2022-07-25 DIAGNOSIS — D492 Neoplasm of unspecified behavior of bone, soft tissue, and skin: Secondary | ICD-10-CM

## 2022-07-25 NOTE — Patient Instructions (Addendum)
Patient Handout: Wound Care for Skin Biopsy Site  Patient Handout: Wound Care for Skin Biopsy Site  Taking Care of Your Skin Biopsy Site  Proper care of the biopsy site is essential for promoting healing and minimizing scarring. This handout provides instructions on how to care for your biopsy site to ensure optimal recovery.  1. Cleaning the Wound:  Clean the biopsy site daily with gentle soap and water. Gently pat the area dry with a clean, soft towel. Avoid harsh scrubbing or rubbing the area, as this can irritate the skin and delay healing.  2. Applying Aquaphor and Bandage:  After cleaning the wound, apply a thin layer of Aquaphor ointment to the biopsy site. Cover the area with a sterile bandage to protect it from dirt, bacteria, and friction. Change the bandage daily or as needed if it becomes soiled or wet.  3. Continued Care for One Week:  Repeat the cleaning, Aquaphor application, and bandaging process daily for one week following the biopsy procedure. Keeping the wound clean and moist during this initial healing period will help prevent infection and promote optimal healing.  4. Massaging Aquaphor into the Area:  ---After one week, discontinue the use of bandages but continue to apply Aquaphor to the biopsy site. ----Gently massage the Aquaphor into the area using circular motions. ---Massaging the skin helps to promote circulation and prevent the formation of scar tissue.   Additional Tips:  Avoid exposing the biopsy site to direct sunlight during the healing process, as this can cause hyperpigmentation or worsen scarring. If you experience any signs of infection, such as increased redness, swelling, warmth, or drainage from the wound, contact your healthcare provider immediately. Follow any additional instructions provided by your healthcare provider for caring for the biopsy site and managing any discomfort. Conclusion:  Taking proper care of your skin biopsy site  is crucial for ensuring optimal healing and minimizing scarring. By following these instructions for cleaning, applying Aquaphor, and massaging the area, you can promote a smooth and successful recovery. If you have any questions or concerns about caring for your biopsy site, don't hesitate to contact your healthcare provider for guidance.     Cryotherapy Aftercare  Wash gently with soap and water everyday.   Apply Vaseline Jelly daily until healed.    Scalp: Start DHS Zinc shampoo, 2-3 times per week lather on scalp, leave on 5-8 minutes, rinse well.      Due to recent changes in healthcare laws, you may see results of your pathology and/or laboratory studies on MyChart before the doctors have had a chance to review them. We understand that in some cases there may be results that are confusing or concerning to you. Please understand that not all results are received at the same time and often the doctors may need to interpret multiple results in order to provide you with the best plan of care or course of treatment. Therefore, we ask that you please give [place] 2 business days to thoroughly review all your results before contacting the office for clarification. Should we see a critical lab result, you will be contacted sooner.   If You Need Anything After Your Visit  If you have any questions or concerns for your doctor, please call our main line at [phone] If no one answers, please leave a voicemail as directed and we will return your call as soon as possible. Messages left after 4 pm will be answered the following business day.   You may also send  [place] a message via MyChart. We typically respond to MyChart messages within 1-2 business days.  For prescription refills, please ask your pharmacy to contact our office. Our fax number is [phone].  If you have an urgent issue when the clinic is closed that cannot wait until the next business day, you can page your doctor at the number  below.    Please note that while we do our best to be available for urgent issues outside of office hours, we are not available 24/7.   If you have an urgent issue and are unable to reach [place], you may choose to seek medical care at your doctor's office, retail clinic, urgent care center, or emergency room.  If you have a medical emergency, please immediately call 911 or go to the emergency department. In the event of inclement weather, please call our main line at [phone] for an update on the status of any delays or closures.  Dermatology Medication Tips: Please keep the boxes that topical medications come in in order to help keep track of the instructions about where and how to use these. Pharmacies typically print the medication instructions only on the boxes and not directly on the medication tubes.   If your medication is too expensive, please contact our office at [phone] or send [place] a message through MyChart.   We are unable to tell what your co-pay for medications will be in advance as this is different depending on your insurance coverage. However, we may be able to find a substitute medication at lower cost or fill out paperwork to get insurance to cover a needed medication.   If a prior authorization is required to get your medication covered by your insurance company, please allow [place] 1-2 business days to complete this process.  Drug prices often vary depending on where the prescription is filled and some pharmacies may offer cheaper prices.  The website www.goodrx.com contains coupons for medications through different pharmacies. The prices here do not account for what the cost may be with help from insurance (it may be cheaper with your insurance), but the website can give you the price if you did not use any insurance.  - You can print the associated coupon and take it with your prescription to the pharmacy.  - You may also stop by our office during regular business hours  and pick up a GoodRx coupon card.  - If you need your prescription sent electronically to a different pharmacy, notify our office through [facility] or by phone at [phone]

## 2022-07-25 NOTE — Progress Notes (Unsigned)
New Patient Visit   Subjective  Douglas Norris is a 67 y.o. male who presents for the following: Spot at left forehead. Dur: 2 years. Will not heal. Non tender. Bleeds at times.   The patient has spots, moles and lesions to be evaluated, some may be new or changing and the patient has concerns that these could be cancer.    The following portions of the chart were reviewed this encounter and updated as appropriate: medications, allergies, medical history  Review of Systems:  No other skin or systemic complaints except as noted in HPI or Assessment and Plan.  Objective  Well appearing patient in no apparent distress; mood and affect are within normal limits.  A focused examination was performed of the following areas: face Relevant physical exam findings are noted in the Assessment and Plan.  Left Forehead 1.5 cm x 1 cm sclerotic plaque with hemorrhagic crust       Assessment & Plan   Neoplasm of skin Left Forehead  Skin / nail biopsy Type of biopsy: tangential   Informed consent: discussed and consent obtained   Anesthesia: the lesion was anesthetized in a standard fashion   Anesthesia comment:  Area prepped with alcohol Anesthetic:  1% lidocaine w/ epinephrine 1-100,000 buffered w/ 8.4% NaHCO3 Instrument used: flexible razor blade   Hemostasis achieved with: pressure, aluminum chloride and electrodesiccation   Outcome: patient tolerated procedure well   Post-procedure details: wound care instructions given   Post-procedure details comment:  Ointment and small bandage applied  Specimen 1 - Surgical pathology Differential Diagnosis: R/O BCC  Check Margins: No   ACTINIC KERATOSIS Exam: Erythematous thin papules/macules with gritty scale  Actinic keratoses are precancerous spots that appear secondary to cumulative UV radiation exposure/sun exposure over time. They are chronic with expected duration over 1 year. A portion of actinic keratoses will progress to  squamous cell carcinoma of the skin. It is not possible to reliably predict which spots will progress to skin cancer and so treatment is recommended to prevent development of skin cancer.  Recommend daily broad spectrum sunscreen SPF 30+ to sun-exposed areas, reapply every 2 hours as needed.  Recommend staying in the shade or wearing long sleeves, sun glasses (UVA+UVB protection) and wide brim hats (4-inch brim around the entire circumference of the hat). Call for new or changing lesions.  Treatment Plan:  Prior to procedure, discussed risks of blister formation, small wound, skin dyspigmentation, or rare scar following cryotherapy. Recommend Vaseline ointment to treated areas while healing.  Destruction Procedure Note Destruction method: cryotherapy   Informed consent: discussed and consent obtained   Lesion destroyed using liquid nitrogen: Yes   Outcome: patient tolerated procedure well with no complications   Post-procedure details: wound care instructions given   Locations: forehead x3, left dorsal hand x1 # of Lesions Treated: 4   SEBORRHEIC DERMATITIS Exam: Pink patches with greasy scale at scalp  Chronic and persistent condition with duration or expected duration over one year. Condition is bothersome/symptomatic for patient. Currently flared.   Seborrheic Dermatitis is a chronic persistent rash characterized by pinkness and scaling most commonly of the mid face but also can occur on the scalp (dandruff), ears; mid chest, mid back and groin.  It tends to be exacerbated by stress and cooler weather.  People who have neurologic disease may experience new onset or exacerbation of existing seborrheic dermatitis.  The condition is not curable but treatable and can be controlled.  Treatment Plan: Start DHS Zinc shampoo, 2-3  times per week lather on scalp, leave on 5-8 minutes, rinse well.   Return in about 2 months (around 09/24/2022) for TBSE.  I, Lawson Radar, CMA, am acting as  scribe for Langston Reusing, MD.   Documentation: I have reviewed the above documentation for accuracy and completeness, and I agree with the above.  Langston Reusing, MD

## 2022-07-26 ENCOUNTER — Encounter: Payer: Self-pay | Admitting: Dermatology

## 2022-07-26 DIAGNOSIS — C4491 Basal cell carcinoma of skin, unspecified: Secondary | ICD-10-CM

## 2022-07-26 HISTORY — DX: Basal cell carcinoma of skin, unspecified: C44.91

## 2022-08-11 DIAGNOSIS — R946 Abnormal results of thyroid function studies: Secondary | ICD-10-CM | POA: Diagnosis not present

## 2022-08-16 NOTE — Progress Notes (Signed)
Hi Douglas Norris,  Please call patient and notify that results of biopsy were positive for a skin cancer that needs to be treated with Mohs Surgery.  We will refer to Dr. Jeannine Boga at [facility]  [facility] [address] [place], [address] [place], [place] [address]  Phone: [phone] Fax: [phone]

## 2022-08-17 ENCOUNTER — Telehealth: Payer: Self-pay

## 2022-08-17 ENCOUNTER — Other Ambulatory Visit: Payer: Self-pay

## 2022-08-17 DIAGNOSIS — C44319 Basal cell carcinoma of skin of other parts of face: Secondary | ICD-10-CM

## 2022-08-17 NOTE — Telephone Encounter (Signed)
-----   Message from Terri Piedra, DO sent at 08/16/2022  1:43 PM EDT ----- Douglas Norris,  Please call patient and notify that results of biopsy were positive for a skin cancer that needs to be treated with Mohs Surgery.  We will refer to Dr. Jeannine Boga at [facility]  [facility] [address] [place], [address] [place], [place] [address]  Phone: [phone] Fax: [phone]

## 2022-09-06 DIAGNOSIS — C44319 Basal cell carcinoma of skin of other parts of face: Secondary | ICD-10-CM | POA: Diagnosis not present

## 2022-09-26 ENCOUNTER — Encounter: Payer: Self-pay | Admitting: Dermatology

## 2022-09-26 ENCOUNTER — Ambulatory Visit: Payer: PPO | Admitting: Dermatology

## 2022-09-26 VITALS — BP 121/77 | HR 67

## 2022-09-26 DIAGNOSIS — Z85828 Personal history of other malignant neoplasm of skin: Secondary | ICD-10-CM

## 2022-09-26 DIAGNOSIS — L821 Other seborrheic keratosis: Secondary | ICD-10-CM

## 2022-09-26 DIAGNOSIS — L814 Other melanin hyperpigmentation: Secondary | ICD-10-CM

## 2022-09-26 DIAGNOSIS — W908XXA Exposure to other nonionizing radiation, initial encounter: Secondary | ICD-10-CM | POA: Diagnosis not present

## 2022-09-26 DIAGNOSIS — L219 Seborrheic dermatitis, unspecified: Secondary | ICD-10-CM | POA: Diagnosis not present

## 2022-09-26 DIAGNOSIS — D1801 Hemangioma of skin and subcutaneous tissue: Secondary | ICD-10-CM

## 2022-09-26 DIAGNOSIS — L578 Other skin changes due to chronic exposure to nonionizing radiation: Secondary | ICD-10-CM | POA: Diagnosis not present

## 2022-09-26 DIAGNOSIS — C44519 Basal cell carcinoma of skin of other part of trunk: Secondary | ICD-10-CM | POA: Diagnosis not present

## 2022-09-26 DIAGNOSIS — D485 Neoplasm of uncertain behavior of skin: Secondary | ICD-10-CM

## 2022-09-26 DIAGNOSIS — Z1283 Encounter for screening for malignant neoplasm of skin: Secondary | ICD-10-CM | POA: Diagnosis not present

## 2022-09-26 DIAGNOSIS — D229 Melanocytic nevi, unspecified: Secondary | ICD-10-CM

## 2022-09-26 DIAGNOSIS — C4491 Basal cell carcinoma of skin, unspecified: Secondary | ICD-10-CM

## 2022-09-26 HISTORY — DX: Basal cell carcinoma of skin, unspecified: C44.91

## 2022-09-26 MED ORDER — CLOBETASOL PROPIONATE 0.05 % EX SOLN: CUTANEOUS | 0 refills | Status: AC

## 2022-09-26 NOTE — Progress Notes (Signed)
Follow-Up Visit   Subjective  Douglas Norris is a 67 y.o. male who presents for the following: Skin Cancer Screening and Full Body Skin Exam. Pt has hx of BCC forehead 4/24 which was treated with Mohs 09/06/22.  The patient presents for Total-Body Skin Exam (TBSE) for skin cancer screening and mole check. The patient has spots, moles and lesions to be evaluated, some may be new or changing and the patient has concerns that these could be cancer.    The following portions of the chart were reviewed this encounter and updated as appropriate: medications, allergies, medical history  Review of Systems:  No other skin or systemic complaints except as noted in HPI or Assessment and Plan.  Objective  Well appearing patient in no apparent distress; mood and affect are within normal limits.  A full examination was performed including scalp, head, eyes, ears, nose, lips, neck, chest, axillae, abdomen, back, buttocks, bilateral upper extremities, bilateral lower extremities, hands, feet, fingers, toes, fingernails, and toenails. All findings within normal limits unless otherwise noted below.   Relevant physical exam findings are noted in the Assessment and Plan.  Right lower back, paraspinal 5mm pink papule with pigment globules         Assessment & Plan   HISTORY OF BASAL CELL CARCINOMA OF THE SKIN. Superficial, Mohs 09/06/22. - No evidence of recurrence today - Recommend regular full body skin exams - Recommend daily broad spectrum sunscreen SPF 30+ to sun-exposed areas, reapply every 2 hours as needed.  - Call if any new or changing lesions are noted between office visits   LENTIGINES, SEBORRHEIC KERATOSES, HEMANGIOMAS - Benign normal skin lesions - Benign-appearing - Call for any changes  MELANOCYTIC NEVI - Tan-brown and/or pink-flesh-colored symmetric macules and papules - Benign appearing on exam today - Observation - Call clinic for new or changing moles - Recommend daily  use of broad spectrum spf 30+ sunscreen to sun-exposed areas.   ACTINIC DAMAGE - Chronic condition, secondary to cumulative UV/sun exposure - diffuse scaly erythematous macules with underlying dyspigmentation - Recommend daily broad spectrum sunscreen SPF 30+ to sun-exposed areas, reapply every 2 hours as needed.  - Staying in the shade or wearing long sleeves, sun glasses (UVA+UVB protection) and wide brim hats (4-inch brim around the entire circumference of the hat) are also recommended for sun protection.  - Call for new or changing lesions.  SKIN CANCER SCREENING PERFORMED TODAY.  SEBORRHEIC DERMATITIS Exam: Pink patches with greasy scale at hairline  wellcontrolled  Seborrheic Dermatitis is a chronic persistent rash characterized by pinkness and scaling most commonly of the mid face but also can occur on the scalp (dandruff), ears; mid chest, mid back and groin.  It tends to be exacerbated by stress and cooler weather.  People who have neurologic disease may experience new onset or exacerbation of existing seborrheic dermatitis.  The condition is not curable but treatable and can be controlled.  Treatment Plan: -Start Clobetasol 0.05% solution to scalp bid prn.  -Continue DHS shampoo as directed.  Pokilpderma Exam: Mottled  actinic damage.  Treatment Plan: Recommend sunscreen daily    Neoplasm of uncertain behavior of skin Right lower back, paraspinal  Skin / nail biopsy Type of biopsy: tangential   Informed consent: discussed and consent obtained   Timeout: patient name, date of birth, surgical site, and procedure verified   Procedure prep:  Patient was prepped and draped in usual sterile fashion Prep type:  Isopropyl alcohol Anesthesia: the lesion was anesthetized in  a standard fashion   Anesthetic:  1% lidocaine w/ epinephrine 1-100,000 buffered w/ 8.4% NaHCO3 Instrument used: DermaBlade   Hemostasis achieved with: aluminum chloride   Outcome: patient tolerated  procedure well   Post-procedure details: sterile dressing applied and wound care instructions given   Dressing type: petrolatum gauze and bandage    Specimen 1 - Surgical pathology Differential Diagnosis: R/O pigmented BCC  Check Margins: No   Return in about 6 months (around 03/28/2023) for TBSE.  Owens Shark, CMA, am acting as scribe for [employer], DO.   Documentation: I have reviewed the above documentation for accuracy and completeness, and I agree with the above.  Langston Reusing, DO

## 2022-09-26 NOTE — Patient Instructions (Signed)
Patient Handout: Wound Care for Skin Biopsy Site  Patient Handout: Wound Care for Skin Biopsy Site  Taking Care of Your Skin Biopsy Site  Proper care of the biopsy site is essential for promoting healing and minimizing scarring. This handout provides instructions on how to care for your biopsy site to ensure optimal recovery.  1. Cleaning the Wound:  Clean the biopsy site daily with gentle soap and water. Gently pat the area dry with a clean, soft towel. Avoid harsh scrubbing or rubbing the area, as this can irritate the skin and delay healing.  2. Applying Aquaphor and Bandage:  After cleaning the wound, apply a thin layer of Aquaphor ointment to the biopsy site. Cover the area with a sterile bandage to protect it from dirt, bacteria, and friction. Change the bandage daily or as needed if it becomes soiled or wet.  3. Continued Care for One Week:  Repeat the cleaning, Aquaphor application, and bandaging process daily for one week following the biopsy procedure. Keeping the wound clean and moist during this initial healing period will help prevent infection and promote optimal healing.  4. Massaging Aquaphor into the Area:  ---After one week, discontinue the use of bandages but continue to apply Aquaphor to the biopsy site. ----Gently massage the Aquaphor into the area using circular motions. ---Massaging the skin helps to promote circulation and prevent the formation of scar tissue.   Additional Tips:  Avoid exposing the biopsy site to direct sunlight during the healing process, as this can cause hyperpigmentation or worsen scarring. If you experience any signs of infection, such as increased redness, swelling, warmth, or drainage from the wound, contact your healthcare provider immediately. Follow any additional instructions provided by your healthcare provider for caring for the biopsy site and managing any discomfort. Conclusion:  Taking proper care of your skin biopsy site  is crucial for ensuring optimal healing and minimizing scarring. By following these instructions for cleaning, applying Aquaphor, and massaging the area, you can promote a smooth and successful recovery. If you have any questions or concerns about caring for your biopsy site, don't hesitate to contact your healthcare provider for guidance.  Plan: Medication Counseling Topical Steroids Counseling: I discussed with the patient that prolonged use of topical steroids can result in the increased appearance of superficial blood vessels (telangiectasias), lightening (hypopigmentation) and thinning of the skin (atrophy). Patient understands to avoid using high potency steroids in skin folds, the groin or the face. The patient verbalized understanding of the proper use and possible adverse effects of topical steroids. All of the patient's questions and concerns were addressed. Due to recent changes in healthcare laws, you may see results of your pathology and/or laboratory studies on MyChart before the doctors have had a chance to review them. We understand that in some cases there may be results that are confusing or concerning to you. Please understand that not all results are received at the same time and often the doctors may need to interpret multiple results in order to provide you with the best plan of care or course of treatment. Therefore, we ask that you please give [place] 2 business days to thoroughly review all your results before contacting the office for clarification. Should we see a critical lab result, you will be contacted sooner.   If You Need Anything After Your Visit  If you have any questions or concerns for your doctor, please call our main line at [phone] If no one answers, please leave a voicemail as  directed and we will return your call as soon as possible. Messages left after 4 pm will be answered the following business day.   You may also send [place] a message via MyChart. We typically  respond to MyChart messages within 1-2 business days.  For prescription refills, please ask your pharmacy to contact our office. Our fax number is [phone].  If you have an urgent issue when the clinic is closed that cannot wait until the next business day, you can page your doctor at the number below.    Please note that while we do our best to be available for urgent issues outside of office hours, we are not available 24/7.   If you have an urgent issue and are unable to reach [place], you may choose to seek medical care at your doctor's office, retail clinic, urgent care center, or emergency room.  If you have a medical emergency, please immediately call 911 or go to the emergency department. In the event of inclement weather, please call our main line at [phone] for an update on the status of any delays or closures.  Dermatology Medication Tips: Please keep the boxes that topical medications come in in order to help keep track of the instructions about where and how to use these. Pharmacies typically print the medication instructions only on the boxes and not directly on the medication tubes.   If your medication is too expensive, please contact our office at [phone] or send [place] a message through MyChart.   We are unable to tell what your co-pay for medications will be in advance as this is different depending on your insurance coverage. However, we may be able to find a substitute medication at lower cost or fill out paperwork to get insurance to cover a needed medication.   If a prior authorization is required to get your medication covered by your insurance company, please allow [place] 1-2 business days to complete this process.  Drug prices often vary depending on where the prescription is filled and some pharmacies may offer cheaper prices.  The website www.goodrx.com contains coupons for medications through different pharmacies. The prices here do not account for what the cost  may be with help from insurance (it may be cheaper with your insurance), but the website can give you the price if you did not use any insurance.  - You can print the associated coupon and take it with your prescription to the pharmacy.  - You may also stop by our office during regular business hours and pick up a GoodRx coupon card.  - If you need your prescription sent electronically to a different pharmacy, notify our office through [facility] or by phone at [phone]

## 2022-09-28 NOTE — Progress Notes (Signed)
Hi Michele,  Please call pt and notify their bx results were positive for a skin CA that will be excised by Dr. Onalee Hua  Please schedule 30 min surgery

## 2022-09-29 ENCOUNTER — Telehealth: Payer: Self-pay

## 2022-09-29 DIAGNOSIS — H00036 Abscess of eyelid left eye, unspecified eyelid: Secondary | ICD-10-CM | POA: Diagnosis not present

## 2022-09-29 DIAGNOSIS — E038 Other specified hypothyroidism: Secondary | ICD-10-CM | POA: Diagnosis not present

## 2022-09-29 NOTE — Telephone Encounter (Signed)
-----   Message from Terri Piedra, DO sent at 09/28/2022  2:10 PM EDT ----- Douglas Norris,  Please call pt and notify their bx results were positive for a skin CA that will be excised by Dr. Onalee Hua  Please schedule 30 min surgery

## 2022-09-29 NOTE — Telephone Encounter (Signed)
I spoke to him and scheduled a procedure

## 2022-10-03 DIAGNOSIS — E038 Other specified hypothyroidism: Secondary | ICD-10-CM | POA: Diagnosis not present

## 2022-10-03 DIAGNOSIS — H00036 Abscess of eyelid left eye, unspecified eyelid: Secondary | ICD-10-CM | POA: Diagnosis not present

## 2022-10-12 DIAGNOSIS — Z48817 Encounter for surgical aftercare following surgery on the skin and subcutaneous tissue: Secondary | ICD-10-CM | POA: Diagnosis not present

## 2022-10-27 ENCOUNTER — Ambulatory Visit (INDEPENDENT_AMBULATORY_CARE_PROVIDER_SITE_OTHER): Payer: PPO | Admitting: Dermatology

## 2022-10-27 DIAGNOSIS — C44519 Basal cell carcinoma of skin of other part of trunk: Secondary | ICD-10-CM | POA: Diagnosis not present

## 2022-10-27 MED ORDER — MUPIROCIN 2 % EX OINT: 1.0000 | TOPICAL_OINTMENT | Freq: Two times a day (BID) | CUTANEOUS | 0 refills | Status: AC

## 2022-10-27 NOTE — Patient Instructions (Signed)
AFTER- CARE OF SURGERY SITE 1. Leave the current dressing on for 18-24 hours and do not get it wet during that time. 2. After 24 hours wash site normally with mild soap/cleanser. 3. Apply a small amount of Mupirocin Ointment (This is a prescription sent to your local pharmacy) to the area. 4. Cover with a bandage using a non stick gauze pad and paper tape or Band Aid(s). 5. Repeat this twice each day for about 7 days, or as instructed by your provider.   Should bleeding occur, apply pressure for 10-15 minutes to the site. If bleeding continues, apply pressure again  for 10-15 minutes. If bleeding persists despite applying consistent pressure, call the office or go to the nearest emergency room.  Call the office for any questions or concerns. NOTE: Pathology results will be reviewed at the time the sutures (stitches) are removed or you will be notified with  the results (which usually takes 7 days)  Supplies for surgical site care: 1. Non stick gauze pads and Band Aids 2. Mild soap/cleanser ([address], [place] [address], Cetaphil, etc.) 3. Paper tape 4. Prescription Mupirocin Ointment or  Aquaphor healing ointment if you're waiting for your prescription.

## 2022-10-27 NOTE — Progress Notes (Signed)
   Follow-Up Visit   Subjective  Douglas Norris is a 67 y.o. male who presents for the following: Excision of BCC at the right lower back  The following portions of the chart were reviewed this encounter and updated as appropriate: medications, allergies, medical history  Review of Systems:  No other skin or systemic complaints except as noted in HPI or Assessment and Plan.  Objective  Well appearing patient in no apparent distress; mood and affect are within normal limits.  A focused examination was performed of the following areas: back Relevant physical exam findings are noted in the Assessment and Plan.   right lower back paraspinal 1.6 cm erythematous patch S/P biopsy.     Assessment & Plan   BCC (basal cell carcinoma), back right lower back paraspinal  Skin excision  Lesion length (cm):  1.6 Lesion width (cm):  1.6 Margin per side (cm):  0.4 Total excision diameter (cm):  2.4 Informed consent: discussed and consent obtained   Timeout: patient name, date of birth, surgical site, and procedure verified   Procedure prep:  Patient was prepped and draped in usual sterile fashion Prep type:  Isopropyl alcohol and chlorhexidine Anesthesia: the lesion was anesthetized in a standard fashion   Instrument used comment:  Dermablade Hemostasis achieved with: aluminum chloride and electrodesiccation   Outcome: patient tolerated procedure well with no complications   Post-procedure details: sterile dressing applied and wound care instructions given   Dressing type: bandage and pressure dressing (mupirocin)    Skin repair Procedure complexity: Secondary Intention. Final length (cm):  0 Informed consent: discussed and consent obtained   Fine/surface layer approximation (top stitches):  Hemostasis achieved with: electrodesiccation Outcome: patient tolerated procedure well with no complications   Post-procedure details: sterile dressing applied and wound care instructions given    Dressing type: pressure dressing and petrolatum    Specimen 1 - Surgical pathology Differential Diagnosis: [facility] Accession # [id number]  Check Margins: Yes     Return if symptoms worsen or fail to improve.  Jaclynn Guarneri, CMA, am acting as scribe for [employer], DO.   Documentation: I have reviewed the above documentation for accuracy and completeness, and I agree with the above.  Langston Reusing, DO

## 2022-11-03 ENCOUNTER — Encounter: Payer: Self-pay | Admitting: Dermatology

## 2022-11-10 NOTE — Progress Notes (Signed)
Great news, the basal cell removed from your back had clear margins!  We will see you in office for your suture removal visit.  Diagnosis Skin (M), right lower back paraspinal EXCISION, RESIDUAL SUPERFICIAL BASAL CELL CARCINOMA, MARGINS FREE

## 2023-01-03 DIAGNOSIS — E038 Other specified hypothyroidism: Secondary | ICD-10-CM | POA: Diagnosis not present

## 2023-03-23 ENCOUNTER — Ambulatory Visit: Payer: PPO | Admitting: Dermatology

## 2023-03-23 ENCOUNTER — Encounter: Payer: Self-pay | Admitting: Dermatology

## 2023-03-23 VITALS — BP 111/73

## 2023-03-23 DIAGNOSIS — L578 Other skin changes due to chronic exposure to nonionizing radiation: Secondary | ICD-10-CM | POA: Diagnosis not present

## 2023-03-23 DIAGNOSIS — L821 Other seborrheic keratosis: Secondary | ICD-10-CM | POA: Diagnosis not present

## 2023-03-23 DIAGNOSIS — L814 Other melanin hyperpigmentation: Secondary | ICD-10-CM | POA: Diagnosis not present

## 2023-03-23 DIAGNOSIS — W908XXA Exposure to other nonionizing radiation, initial encounter: Secondary | ICD-10-CM | POA: Diagnosis not present

## 2023-03-23 DIAGNOSIS — D1801 Hemangioma of skin and subcutaneous tissue: Secondary | ICD-10-CM | POA: Diagnosis not present

## 2023-03-23 DIAGNOSIS — Z1283 Encounter for screening for malignant neoplasm of skin: Secondary | ICD-10-CM | POA: Diagnosis not present

## 2023-03-23 DIAGNOSIS — Z85828 Personal history of other malignant neoplasm of skin: Secondary | ICD-10-CM | POA: Diagnosis not present

## 2023-03-23 DIAGNOSIS — D229 Melanocytic nevi, unspecified: Secondary | ICD-10-CM

## 2023-03-23 NOTE — Progress Notes (Signed)
   Total Body Skin Exam (TBSE) Visit   Subjective  Douglas Norris is a 67 y.o. male who presents for the following: Skin Cancer Screening and Full Body Skin Exam  Patient presents today for follow up visit for TBSE. Patient was last evaluated on 10/27/22 for Ex of BCC on right lower back paraspinal . Patient denies medication changes. Patient reports he  does not have spots, moles and lesions of concern to be evaluated. Patient reports throughout his lifetime he  has had minimal sun exposure. Currently, patient reports if he  has excessive sun exposure, he  does apply sunscreen and/or wears protective coverings. Patient reports he  has hx of bx. Patient denies  family history of skin cancers. The patient has spots, moles and lesions to be evaluated, some may be new or changing and the patient has concerns that these could be cancer.  The following portions of the chart were reviewed this encounter and updated as appropriate: medications, allergies, medical history  Review of Systems:  No other skin or systemic complaints except as noted in HPI or Assessment and Plan.  Objective  Well appearing patient in no apparent distress; mood and affect are within normal limits.  A full examination was performed including scalp, head, eyes, ears, nose, lips, neck, chest, axillae, abdomen, back, buttocks, bilateral upper extremities, bilateral lower extremities, hands, feet, fingers, toes, fingernails, and toenails. All findings within normal limits unless otherwise noted below.   Relevant physical exam findings are noted in the Assessment and Plan.    Assessment & Plan   LENTIGINES, SEBORRHEIC KERATOSES, HEMANGIOMAS - Benign normal skin lesions - Benign-appearing - Call for any changes  BENIGN MELANOCYTIC NEVI - Tan-brown and/or pink-flesh-colored symmetric macules and papules - Benign appearing on exam today - Observation - Call clinic for new or changing moles - Recommend daily use of broad  spectrum spf 30+ sunscreen to sun-exposed areas.   MILD ACTINIC DAMAGE - Chronic condition, secondary to cumulative UV/sun exposure - diffuse scaly erythematous macules with underlying dyspigmentation - Recommend daily broad spectrum sunscreen SPF 30+ to sun-exposed areas, reapply every 2 hours as needed.  - Staying in the shade or wearing long sleeves, sun glasses (UVA+UVB protection) and wide brim hats (4-inch brim around the entire circumference of the hat) are also recommended for sun protection.  - Call for new or changing lesions.  SKIN CANCER SCREENING PERFORMED TODAY.  HISTORY OF BASAL CELL CARCINOMA OF THE SKIN - Well healed scar on left side of forehead - Well healed scar on right lower back paraspinal - No evidence of recurrence today  - Recommend regular full body skin exams - Recommend daily broad spectrum sunscreen SPF 30+ to sun-exposed areas, reapply every 2 hours as needed.  - Call if any new or changing lesions are noted between office visits    No follow-ups on file.    Documentation: I have reviewed the above documentation for accuracy and completeness, and I agree with the above.   I, Shirron Marcha Solders, CMA, am acting as scribe for [employer], DO.   Langston Reusing, DO

## 2023-03-23 NOTE — Patient Instructions (Signed)

## 2023-06-20 DIAGNOSIS — I77810 Thoracic aortic ectasia: Secondary | ICD-10-CM | POA: Diagnosis not present

## 2023-06-20 DIAGNOSIS — E038 Other specified hypothyroidism: Secondary | ICD-10-CM | POA: Diagnosis not present

## 2023-06-20 DIAGNOSIS — E782 Mixed hyperlipidemia: Secondary | ICD-10-CM | POA: Diagnosis not present

## 2023-06-20 DIAGNOSIS — Z0000 Encounter for general adult medical examination without abnormal findings: Secondary | ICD-10-CM | POA: Diagnosis not present

## 2023-06-20 DIAGNOSIS — J309 Allergic rhinitis, unspecified: Secondary | ICD-10-CM | POA: Diagnosis not present

## 2023-06-20 DIAGNOSIS — I700 Atherosclerosis of aorta: Secondary | ICD-10-CM | POA: Diagnosis not present

## 2023-06-21 ENCOUNTER — Other Ambulatory Visit: Payer: Self-pay | Admitting: Physician Assistant

## 2023-06-21 DIAGNOSIS — I77810 Thoracic aortic ectasia: Secondary | ICD-10-CM

## 2023-07-17 ENCOUNTER — Ambulatory Visit
Admission: RE | Admit: 2023-07-17 | Discharge: 2023-07-17 | Disposition: A | Discharge status: Home / Self Care | Source: Ambulatory Visit | Attending: Physician Assistant | Admitting: Physician Assistant

## 2023-07-17 DIAGNOSIS — I77810 Thoracic aortic ectasia: Secondary | ICD-10-CM

## 2023-07-17 MED ORDER — IOPAMIDOL (ISOVUE-370) INJECTION 76%
95.0000 mL | Freq: Once | INTRAVENOUS | Status: AC | PRN
  Administered 2023-07-17: 95 mL via INTRAVENOUS

## 2023-07-24 DIAGNOSIS — R140 Abdominal distension (gaseous): Secondary | ICD-10-CM | POA: Diagnosis not present

## 2023-07-24 DIAGNOSIS — K5900 Constipation, unspecified: Secondary | ICD-10-CM | POA: Diagnosis not present

## 2023-07-24 DIAGNOSIS — R1013 Epigastric pain: Secondary | ICD-10-CM | POA: Diagnosis not present

## 2023-07-25 ENCOUNTER — Ambulatory Visit (INDEPENDENT_AMBULATORY_CARE_PROVIDER_SITE_OTHER): Payer: PPO | Admitting: Dermatology

## 2023-07-25 ENCOUNTER — Encounter: Payer: Self-pay | Admitting: Dermatology

## 2023-07-25 VITALS — BP 99/60 | HR 66

## 2023-07-25 DIAGNOSIS — Z85828 Personal history of other malignant neoplasm of skin: Secondary | ICD-10-CM

## 2023-07-25 DIAGNOSIS — L570 Actinic keratosis: Secondary | ICD-10-CM

## 2023-07-25 DIAGNOSIS — L578 Other skin changes due to chronic exposure to nonionizing radiation: Secondary | ICD-10-CM | POA: Diagnosis not present

## 2023-07-25 DIAGNOSIS — L814 Other melanin hyperpigmentation: Secondary | ICD-10-CM | POA: Diagnosis not present

## 2023-07-25 DIAGNOSIS — B079 Viral wart, unspecified: Secondary | ICD-10-CM

## 2023-07-25 DIAGNOSIS — L821 Other seborrheic keratosis: Secondary | ICD-10-CM | POA: Diagnosis not present

## 2023-07-25 DIAGNOSIS — Z1283 Encounter for screening for malignant neoplasm of skin: Secondary | ICD-10-CM

## 2023-07-25 DIAGNOSIS — D229 Melanocytic nevi, unspecified: Secondary | ICD-10-CM

## 2023-07-25 DIAGNOSIS — W908XXA Exposure to other nonionizing radiation, initial encounter: Secondary | ICD-10-CM | POA: Diagnosis not present

## 2023-07-25 DIAGNOSIS — D1801 Hemangioma of skin and subcutaneous tissue: Secondary | ICD-10-CM

## 2023-07-25 HISTORY — DX: Personal history of other malignant neoplasm of skin: Z85.828

## 2023-07-25 NOTE — Progress Notes (Signed)
 Total Body Skin Exam (TBSE) Visit   Subjective  Douglas Norris is a 68 y.o. male who presents for the following: Skin Cancer Screening and Full Body Skin Exam  Patient presents today for follow up visit for TBSE. Patient was last evaluated on 03/23/23 for TBSE. Patient denies medication changes. Patient reports he does not have spots, moles and lesions of concern to be evaluated. Patient reports throughout his lifetime he has had minimal sun exposure. Currently, patient reports if he has excessive sun exposure, he  does apply sunscreen and/or wears protective coverings. Patient reports he  has hx of bx. Patient denies  family history of skin cancers. The patient has spots, moles and lesions to be evaluated, some may be new or changing and the patient has concerns that these could be cancer.    The following portions of the chart were reviewed this encounter and updated as appropriate: medications, allergies, medical history  Review of Systems:  No other skin or systemic complaints except as noted in HPI or Assessment and Plan.  Objective  Well appearing patient in no apparent distress; mood and affect are within normal limits.  A full examination was performed including scalp, head, eyes, ears, nose, lips, neck, chest, axillae, abdomen, back, buttocks, bilateral upper extremities, bilateral lower extremities, hands, feet, fingers, toes, fingernails, and toenails. All findings within normal limits unless otherwise noted below.   Relevant physical exam findings are noted in the Assessment and Plan.  Head - Anterior (Face), Left Forearm - Posterior, Left Wrist - Posterior, Right Temple (2), Scalp Erythematous thin papules/macules with gritty scale.  Left Lateral Canthus Eyelid 3 mm verrucous papule  Assessment & Plan   LENTIGINES, SEBORRHEIC KERATOSES, HEMANGIOMAS - Benign normal skin lesions - Benign-appearing - Call for any changes  MELANOCYTIC NEVI - Tan-brown and/or  pink-flesh-colored symmetric macules and papules - Benign appearing on exam today - Observation - Call clinic for new or changing moles - Recommend daily use of broad spectrum spf 30+ sunscreen to sun-exposed areas.   ACTINIC DAMAGE - Chronic condition, secondary to cumulative UV/sun exposure - diffuse scaly erythematous macules with underlying dyspigmentation - Recommend daily broad spectrum sunscreen SPF 30+ to sun-exposed areas, reapply every 2 hours as needed.  - Staying in the shade or wearing long sleeves, sun glasses (UVA+UVB protection) and wide brim hats (4-inch brim around the entire circumference of the hat) are also recommended for sun protection.  - Call for new or changing lesions.  HISTORY OF BASAL CELL CARCINOMA OF THE SKIN Left Forehead and Right Lower Back Para Spinal - No evidence of recurrence today - Recommend regular full body skin exams - Recommend daily broad spectrum sunscreen SPF 30+ to sun-exposed areas, reapply every 2 hours as needed.  - Call if any new or changing lesions are noted between office visits   HISTORY OF SQUAMOUS CELL CARCINOMA OF THE Left Tonsil - No evidence of recurrence today - No lymphadenopathy - Recommend regular full body skin exams - Recommend daily broad spectrum sunscreen SPF 30+ to sun-exposed areas, reapply every 2 hours as needed.  - Call if any new or changing lesions are noted between office visits  WART Exam: Lateral Cantus Right eye 3 mm verrucous papule(s)  Counseling Discussed viral / HPV (Human Papilloma Virus) etiology and risk of spread /infectivity to other areas of body as well as to other people.  Multiple treatments and methods may be required to clear warts and it is possible treatment may not be successful.  Treatment risks include discoloration; scarring and there is still potential for wart recurrence.  Treatment Plan: - Cryotherapy Completed While in office today  SKIN CANCER SCREENING PERFORMED  TODAY.   AK (ACTINIC KERATOSIS) (6) Head - Anterior (Face), Left Forearm - Posterior, Left Wrist - Posterior, Right Temple (2), Scalp Destruction of lesion - Head - Anterior (Face), Left Forearm - Posterior, Left Wrist - Posterior, Right Temple (2), Scalp Complexity: simple   Destruction method: cryotherapy   Informed consent: discussed and consent obtained   Timeout:  patient name, date of birth, surgical site, and procedure verified Lesion destroyed using liquid nitrogen: Yes   Cryotherapy cycles:  6 Post-procedure details: wound care instructions given   VIRAL WARTS, UNSPECIFIED TYPE Left Lateral Canthus Eyelid Destruction of lesion - Left Lateral Canthus Eyelid Complexity: simple   Destruction method: cryotherapy   Informed consent: discussed and consent obtained   Timeout:  patient name, date of birth, surgical site, and procedure verified Lesion destroyed using liquid nitrogen: Yes   Cryotherapy cycles:  1 Post-procedure details: wound care instructions given   Return in 6 months (on 01/24/2024) for TBSE.  I, Jetta Ager, am acting as Neurosurgeon for [employer], DO.  Documentation: I have reviewed the above documentation for accuracy and completeness, and I agree with the above.  Louana Roup, DO

## 2023-07-25 NOTE — Patient Instructions (Addendum)

## 2023-09-25 DIAGNOSIS — K921 Melena: Secondary | ICD-10-CM | POA: Diagnosis not present

## 2023-10-03 DIAGNOSIS — K5900 Constipation, unspecified: Secondary | ICD-10-CM | POA: Diagnosis not present

## 2023-10-03 DIAGNOSIS — K921 Melena: Secondary | ICD-10-CM | POA: Diagnosis not present

## 2023-10-03 DIAGNOSIS — R1030 Lower abdominal pain, unspecified: Secondary | ICD-10-CM | POA: Diagnosis not present

## 2023-10-03 DIAGNOSIS — K5792 Diverticulitis of intestine, part unspecified, without perforation or abscess without bleeding: Secondary | ICD-10-CM | POA: Diagnosis not present

## 2023-10-27 DIAGNOSIS — L290 Pruritus ani: Secondary | ICD-10-CM | POA: Diagnosis not present

## 2023-10-27 DIAGNOSIS — R42 Dizziness and giddiness: Secondary | ICD-10-CM | POA: Diagnosis not present

## 2023-10-27 DIAGNOSIS — K648 Other hemorrhoids: Secondary | ICD-10-CM | POA: Diagnosis not present

## 2023-12-05 DIAGNOSIS — S31809D Unspecified open wound of unspecified buttock, subsequent encounter: Secondary | ICD-10-CM | POA: Diagnosis not present

## 2023-12-05 DIAGNOSIS — L308 Other specified dermatitis: Secondary | ICD-10-CM | POA: Diagnosis not present

## 2024-01-24 ENCOUNTER — Ambulatory Visit: Admitting: Dermatology
